# Patient Record
Sex: Female | Born: 1945
Health system: Southern US, Community
[De-identification: ages and names within clinical notes are randomized; demographics above are authoritative.]

## PROBLEM LIST (undated history)

## (undated) DIAGNOSIS — L603 Nail dystrophy: Secondary | ICD-10-CM

## (undated) DIAGNOSIS — F419 Anxiety disorder, unspecified: Secondary | ICD-10-CM

## (undated) DIAGNOSIS — R600 Localized edema: Secondary | ICD-10-CM

## (undated) DIAGNOSIS — Z780 Asymptomatic menopausal state: Secondary | ICD-10-CM

## (undated) DIAGNOSIS — E669 Obesity, unspecified: Secondary | ICD-10-CM

## (undated) DIAGNOSIS — R609 Edema, unspecified: Secondary | ICD-10-CM

## (undated) DIAGNOSIS — R9431 Abnormal electrocardiogram [ECG] [EKG]: Secondary | ICD-10-CM

## (undated) DIAGNOSIS — F32A Depression, unspecified: Secondary | ICD-10-CM

## (undated) DIAGNOSIS — E7800 Pure hypercholesterolemia, unspecified: Secondary | ICD-10-CM

## (undated) DIAGNOSIS — L03116 Cellulitis of left lower limb: Secondary | ICD-10-CM

## (undated) DIAGNOSIS — I10 Essential (primary) hypertension: Secondary | ICD-10-CM

## (undated) DIAGNOSIS — N289 Disorder of kidney and ureter, unspecified: Secondary | ICD-10-CM

## (undated) HISTORY — DX: Localized edema: R60.0

## (undated) HISTORY — DX: Anxiety disorder, unspecified: F41.9

## (undated) HISTORY — DX: Nail dystrophy: L60.3

## (undated) HISTORY — DX: Obesity, unspecified: E66.9

## (undated) HISTORY — DX: Edema, unspecified: R60.9

## (undated) HISTORY — DX: Cellulitis of left lower limb: L03.116

## (undated) HISTORY — DX: Abnormal electrocardiogram (ECG) (EKG): R94.31

## (undated) HISTORY — DX: Asymptomatic menopausal state: Z78.0

## (undated) HISTORY — DX: Disorder of kidney and ureter, unspecified: N28.9

## (undated) HISTORY — DX: Essential (primary) hypertension: I10

## (undated) HISTORY — DX: Depression, unspecified: F32.A

---

## 1992-07-14 HISTORY — PX: TUBAL LIGATION: SHX77

## 1999-07-11 ENCOUNTER — Ambulatory Visit: Admission: RE | Admit: 1999-07-11 | Discharge: 1999-07-11 | Payer: Self-pay | Admitting: Family Medicine

## 1999-10-04 ENCOUNTER — Other Ambulatory Visit: Admission: RE | Admit: 1999-10-04 | Discharge: 1999-10-04 | Payer: Self-pay | Admitting: Obstetrics & Gynecology

## 2001-06-25 ENCOUNTER — Other Ambulatory Visit: Admission: RE | Admit: 2001-06-25 | Discharge: 2001-06-25 | Payer: Self-pay | Admitting: Obstetrics & Gynecology

## 2002-05-12 ENCOUNTER — Emergency Department (HOSPITAL_COMMUNITY): Admission: EM | Admit: 2002-05-12 | Discharge: 2002-05-12 | Payer: Self-pay | Admitting: Emergency Medicine

## 2002-05-13 ENCOUNTER — Encounter: Payer: Self-pay | Admitting: *Deleted

## 2002-07-01 ENCOUNTER — Other Ambulatory Visit: Admission: RE | Admit: 2002-07-01 | Discharge: 2002-07-01 | Payer: Self-pay | Admitting: Obstetrics & Gynecology

## 2003-04-21 ENCOUNTER — Emergency Department (HOSPITAL_COMMUNITY): Admission: AD | Admit: 2003-04-21 | Discharge: 2003-04-21 | Payer: Self-pay | Admitting: Family Medicine

## 2003-07-12 ENCOUNTER — Other Ambulatory Visit: Admission: RE | Admit: 2003-07-12 | Discharge: 2003-07-12 | Payer: Self-pay | Admitting: Obstetrics & Gynecology

## 2004-07-12 ENCOUNTER — Other Ambulatory Visit: Admission: RE | Admit: 2004-07-12 | Discharge: 2004-07-12 | Payer: Self-pay | Admitting: Obstetrics & Gynecology

## 2005-05-07 ENCOUNTER — Encounter: Admission: RE | Admit: 2005-05-07 | Discharge: 2005-05-07 | Payer: Self-pay | Admitting: Family Medicine

## 2005-07-11 ENCOUNTER — Other Ambulatory Visit: Admission: RE | Admit: 2005-07-11 | Discharge: 2005-07-11 | Payer: Self-pay | Admitting: Obstetrics & Gynecology

## 2006-11-23 ENCOUNTER — Emergency Department (HOSPITAL_COMMUNITY): Admission: EM | Admit: 2006-11-23 | Discharge: 2006-11-23 | Payer: Self-pay | Admitting: Emergency Medicine

## 2007-09-29 ENCOUNTER — Encounter (INDEPENDENT_AMBULATORY_CARE_PROVIDER_SITE_OTHER): Payer: Self-pay | Admitting: Internal Medicine

## 2007-09-29 ENCOUNTER — Ambulatory Visit (HOSPITAL_COMMUNITY): Admission: RE | Admit: 2007-09-29 | Discharge: 2007-09-29 | Payer: Self-pay | Admitting: Internal Medicine

## 2007-09-29 ENCOUNTER — Ambulatory Visit: Payer: Self-pay | Admitting: Internal Medicine

## 2007-09-29 DIAGNOSIS — R609 Edema, unspecified: Secondary | ICD-10-CM

## 2007-09-29 DIAGNOSIS — F411 Generalized anxiety disorder: Secondary | ICD-10-CM

## 2007-09-29 DIAGNOSIS — R9431 Abnormal electrocardiogram [ECG] [EKG]: Secondary | ICD-10-CM

## 2007-09-30 LAB — CONVERTED CEMR LAB
AST: 19 units/L (ref 0–37)
Albumin: 3.4 g/dL — ABNORMAL LOW (ref 3.5–5.2)
Alkaline Phosphatase: 80 units/L (ref 39–117)
Glucose, Bld: 93 mg/dL (ref 70–99)
MCHC: 33.7 g/dL (ref 30.0–36.0)
Potassium: 4.1 meq/L (ref 3.5–5.3)
RBC: 3.86 M/uL — ABNORMAL LOW (ref 3.87–5.11)
RDW: 14.6 % (ref 11.5–15.5)
Sodium: 138 meq/L (ref 135–145)
Total Protein: 6.9 g/dL (ref 6.0–8.3)

## 2007-11-09 ENCOUNTER — Ambulatory Visit (HOSPITAL_COMMUNITY): Admission: RE | Admit: 2007-11-09 | Discharge: 2007-11-09 | Payer: Self-pay | Admitting: Internal Medicine

## 2007-11-09 ENCOUNTER — Encounter (INDEPENDENT_AMBULATORY_CARE_PROVIDER_SITE_OTHER): Payer: Self-pay | Admitting: Internal Medicine

## 2008-10-09 ENCOUNTER — Encounter (INDEPENDENT_AMBULATORY_CARE_PROVIDER_SITE_OTHER): Payer: Self-pay | Admitting: Internal Medicine

## 2008-10-09 ENCOUNTER — Ambulatory Visit: Payer: Self-pay | Admitting: Internal Medicine

## 2008-10-09 ENCOUNTER — Ambulatory Visit (HOSPITAL_COMMUNITY): Admission: RE | Admit: 2008-10-09 | Discharge: 2008-10-09 | Payer: Self-pay | Admitting: Internal Medicine

## 2008-10-09 DIAGNOSIS — L608 Other nail disorders: Secondary | ICD-10-CM | POA: Insufficient documentation

## 2008-10-10 ENCOUNTER — Telehealth: Payer: Self-pay | Admitting: Internal Medicine

## 2008-10-16 ENCOUNTER — Encounter (INDEPENDENT_AMBULATORY_CARE_PROVIDER_SITE_OTHER): Payer: Self-pay | Admitting: Internal Medicine

## 2008-10-16 ENCOUNTER — Encounter: Payer: Self-pay | Admitting: Internal Medicine

## 2008-10-16 ENCOUNTER — Ambulatory Visit: Payer: Self-pay | Admitting: Internal Medicine

## 2008-10-16 ENCOUNTER — Ambulatory Visit (HOSPITAL_COMMUNITY): Admission: RE | Admit: 2008-10-16 | Discharge: 2008-10-16 | Payer: Self-pay | Admitting: Internal Medicine

## 2008-10-16 ENCOUNTER — Ambulatory Visit: Payer: Self-pay | Admitting: Vascular Surgery

## 2008-10-16 DIAGNOSIS — L03119 Cellulitis of unspecified part of limb: Secondary | ICD-10-CM

## 2008-10-16 DIAGNOSIS — L02619 Cutaneous abscess of unspecified foot: Secondary | ICD-10-CM

## 2008-10-17 ENCOUNTER — Encounter (INDEPENDENT_AMBULATORY_CARE_PROVIDER_SITE_OTHER): Payer: Self-pay | Admitting: Internal Medicine

## 2008-10-17 LAB — CONVERTED CEMR LAB
BUN: 12 mg/dL (ref 6–23)
CO2: 27 meq/L (ref 19–32)
Calcium: 9.4 mg/dL (ref 8.4–10.5)
Chloride: 111 meq/L (ref 96–112)
Cholesterol: 199 mg/dL (ref 0–200)
Creatinine, Ser: 0.85 mg/dL (ref 0.40–1.20)
GFR calc Af Amer: >60 mL/min (ref >60)
MCHC: 31.9 g/dL (ref 30.0–36.0)
RBC: 4.07 M/uL (ref 3.87–5.11)
Total CHOL/HDL Ratio: 4.3
Triglycerides: 104 mg/dL (ref <150)
VLDL: 21 mg/dL (ref 0–40)
WBC: 7.6 10*3/uL (ref 4.0–10.5)

## 2008-10-18 ENCOUNTER — Telehealth: Payer: Self-pay | Admitting: Internal Medicine

## 2008-10-23 ENCOUNTER — Encounter (INDEPENDENT_AMBULATORY_CARE_PROVIDER_SITE_OTHER): Payer: Self-pay | Admitting: Internal Medicine

## 2008-10-24 ENCOUNTER — Ambulatory Visit: Payer: Self-pay | Admitting: Internal Medicine

## 2008-10-24 ENCOUNTER — Encounter: Payer: Self-pay | Admitting: Internal Medicine

## 2008-10-24 LAB — CONVERTED CEMR LAB
Anti Nuclear Antibody(ANA): NEGATIVE
Rhuematoid fact SerPl-aCnc: <20 intl units/mL (ref 0–20)
Uric Acid, Serum: 4.5 mg/dL (ref 2.4–7.0)

## 2008-10-25 ENCOUNTER — Encounter: Payer: Self-pay | Admitting: Internal Medicine

## 2008-11-13 ENCOUNTER — Encounter: Admission: RE | Admit: 2008-11-13 | Discharge: 2008-11-13 | Payer: Self-pay | Admitting: Podiatry

## 2009-07-05 ENCOUNTER — Ambulatory Visit: Payer: Self-pay | Admitting: Internal Medicine

## 2009-07-11 ENCOUNTER — Ambulatory Visit (HOSPITAL_COMMUNITY): Admission: RE | Admit: 2009-07-11 | Discharge: 2009-07-11 | Payer: Self-pay | Admitting: Internal Medicine

## 2009-07-11 LAB — HM MAMMOGRAPHY: HM Mammogram: NEGATIVE

## 2010-06-17 ENCOUNTER — Ambulatory Visit: Payer: Self-pay | Admitting: Internal Medicine

## 2010-06-17 DIAGNOSIS — J069 Acute upper respiratory infection, unspecified: Secondary | ICD-10-CM | POA: Insufficient documentation

## 2010-07-11 ENCOUNTER — Ambulatory Visit (HOSPITAL_COMMUNITY): Admission: RE | Admit: 2010-07-11 | Payer: Self-pay | Source: Home / Self Care | Admitting: Internal Medicine

## 2010-08-04 ENCOUNTER — Encounter: Payer: Self-pay | Admitting: Internal Medicine

## 2010-08-12 ENCOUNTER — Emergency Department (HOSPITAL_COMMUNITY)
Admission: EM | Admit: 2010-08-12 | Discharge: 2010-08-12 | Payer: Self-pay | Source: Home / Self Care | Admitting: Family Medicine

## 2010-08-13 NOTE — Assessment & Plan Note (Signed)
Summary: ACUTE-PHYSICAL FOR A JOB PATIENT BRINGING PAPERWORK/(MAGICK)/CFB   Vital Signs:  Patient profile:   65 year old female Height:      64.5 inches (163.83 cm) Weight:      212.5 pounds (96.59 kg) BMI:     36.04 Temp:     97.3 degrees F Pulse rate:   64 / minute BP sitting:   151 / 78  (right arm) Cuff size:   large  Vitals Entered By: Dorie Rank RN (June 17, 2010 1:46 PM) CC: physical for job - know needs mmg and PAP done - job is Programmer, applications at [employer] - "just needs physical - no form brought by pt" Is Patient Diabetic? No Pain Assessment Patient in pain? no      Nutritional Status BMI of > 30 = obese  Have you ever been in a relationship where you felt threatened, hurt or afraid?No   Does patient need assistance? Functional Status Self care Ambulation Normal Comments mmg appt made for 07/11/2010 at 1:10 P at [facility] - pt aware last mmg was 07/11/2009 so has to wait to have mmg done at one year for her [employer] to cover the appt. B/P sitting repeated manually left arm - 128/86 - Dr.Jourdyn Ferrin so notified   Primary Care Provider:  Mliss Sax MD  CC:  physical for job - know needs mmg and PAP done - job is Programmer, applications at [employer] - "just needs physical - no form brought by pt".  History of Present Illness: Patient is a 65 year old female who presents to clinic in need of a physical for work.  She states that she is due for a mammogram and her pap smear.  Her only major complaint today is a cough that she has had for 1 and a half weeks.  She denies any fever, chills, SOB, chest pain, or production from the cough.  She does have a sore throat and feels something draining down her throat but no nasal congestion. She has tried OTC cough medicines, alka seltzer cold and flu, and mucinex DM which have had little effect. She has been up alot at night because of the coughing.    Preventive Screening-Counseling & Management  Alcohol-Tobacco  Alcohol drinks/day: 0     Smoking Status: never  Caffeine-Diet-Exercise     Does Patient Exercise: yes  Current Medications (verified): 1)  None  Allergies (verified): No Known Drug Allergies  Past History:  Past medical, surgical, family and social histories (including risk factors) reviewed, and no changes noted (except as noted below).  Past Medical History: Reviewed history from 09/29/2007 and no changes required. Anxiety  Family History: Reviewed history from 09/29/2007 and no changes required. Father - prostate cancer, died at 32. Mother - DM2, HTN, died of an aneurysm at 62. Sister - died, "a lot of problems" Sister - died, "a lot of problems" Sister - older, healthy 5 children - healthy, exc. 1 child deaf, 1 daughter - HTN, HAs  Social History: Reviewed history from 09/29/2007 and no changes required. 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) Married Lives with husband in [place] No alcohol, tobacco, drugs Regular exercise-yes (cleaning) Used to play bowling (!)  Review of Systems  The patient denies anorexia, fever, weight loss, weight gain, vision loss, decreased hearing, hoarseness, chest pain, syncope, dyspnea on exertion, peripheral edema, headaches, hemoptysis, abdominal pain, melena, hematochezia, severe indigestion/heartburn, hematuria, incontinence, genital sores, muscle weakness, suspicious  skin lesions, transient blindness, difficulty walking, depression, unusual weight change, abnormal bleeding, enlarged lymph nodes, angioedema, and breast masses.    Physical Exam  Additional Exam:  General: Well developed, female in no acute distress.  Vital signs reviewed.  Head: Atraumatic, normocephalic with no signs of trauma  Eyes: PERRLA, EOM intact  Ears: TM intact  Nose: Nares patent, mucosa is pink and moist, no polyps noted  Mouth: Mucosa is pink and moist, dental plates well fitting and no sores  Neck: Supple, full ROM,  no thyromegaly or masses noted  Resp: Clear to ascultation bilaterally, no wheezes, rales, or rhonchi noted  CV: Regular rate and rhythm with no murmurs, rubs, or gallops noted  Abdomen: Soft, non-tender, non-distended with normal bowel sounds  Musculoskelatal: ROM full with intact strength, no pain to palpation  Neurologic: Alert and oriented x3, Cranial nerves II-XII grossly intact, DTR normal and symmetric  Pulses: Radial, brachial, carotid, femoral, dorsal pedis, and posterior tibial pulses equal and symmetric     Impression & Recommendations:  Problem # 1:  URI (ICD-465.9) SHe likely has a viral URI that appears to be clearing.  Her husband has been sick as well.  She is requesting something to help with the cough during the night so she can rest.  We will give her some Chertussin that she can take at night to help.  She was warned that the medication can make her drowsy, unsteady on her feet, and sedated.    The following medications were removed from the medication list:    Ibuprofen 400 Mg Tabs (Ibuprofen) .Marland Kitchen... Take 1 tablet every 4-6 hours as needed for pain Her updated medication list for this problem includes:    Guaifenesin-codeine 100-10 Mg/74ml Syrp (Guaifenesin-codeine) .Marland Kitchen... Take 1 teaspoon at bedtime for cough relief.  Problem # 2:  Preventive Health Care (ICD-V70.0) She is due for her preventative health testing including mammograms.  We will schedule the mammogram for 1 year after her last one.  She has never had an abnormal pap smear and is in a stable relationship so she would be a candidate to have a pap smear every other year based on the latest recommendations.  She will check with her insurance before proceeding with the pap smear to ensure that it would be covered.  She has gotten her flu shot this year but she can't remember the last time she had a tetanus shot.  She is not a candidate for the pneumovax until she turns 66.  We also discussed colonoscopy and she states  that she knows her husband needs one as well so after he gets his done she will make an appointment to have her's done as well.    Complete Medication List: 1)  Guaifenesin-codeine 100-10 Mg/54ml Syrp (Guaifenesin-codeine) .... Take 1 teaspoon at bedtime for cough relief.  Other Orders: Mammogram (Screening) (Mammo)  Patient Instructions: 1)  Start Cheratussin.  1 teaspoon at night.   2)  Caution: Can cause drowsiness, dizziness, and unsteadiness. 3)  Keep your mammogram appointment and consider the colonoscopy in the near future.   Prescriptions: GUAIFENESIN-CODEINE 100-10 MG/5ML SYRP (GUAIFENESIN-CODEINE) Take 1 teaspoon at bedtime for cough relief.  #100 ml x 0   Entered and Authorized by:   Leodis Sias MD   Signed by:   Leodis Sias MD on 06/17/2010   Method used:   Print then Give to Patient   RxID:   [id number]    Orders Added: 1)  Est. Patient Level III [14782]  2)  Mammogram (Screening) [Mammo]   Immunization History:  Influenza Immunization History:    Influenza:  fluvax 3+ (05/27/2010)   Immunization History:  Influenza Immunization History:    Influenza:  Fluvax 3+ (05/27/2010)   Prevention & Chronic Care Immunizations   Influenza vaccine: Fluvax 3+  (05/27/2010)    Tetanus booster: Not documented    Pneumococcal vaccine: Not documented    H. zoster vaccine: Not documented  Colorectal Screening   Hemoccult: Not documented    Colonoscopy: Not documented   Colonoscopy action/deferral: Deferred  (06/17/2010)  Other Screening   Pap smear: NEGATIVE FOR INTRAEPITHELIAL LESIONS OR MALIGNANCY.  (07/03/2009)    Mammogram: ASSESSMENT: Negative - BI-RADS 1^MM DIGITAL SCREENING  (07/11/2009)    DXA bone density scan: Not documented   Smoking status: never  (06/17/2010)  Lipids   Total Cholesterol: 199  (10/16/2008)   LDL: 132  (10/16/2008)   LDL Direct: Not documented   HDL: 46  (10/16/2008)   Triglycerides: 104   (10/16/2008)   Appended Document: ACUTE-PHYSICAL FOR A JOB PATIENT BRINGING PAPERWORK/(MAGICK)/CFB Ms. Schecter's history and physical examination were reviewed with Dr. Tonny Branch and the assessment and plan were formulated together.  I agree with the above documentation.

## 2010-08-13 NOTE — Assessment & Plan Note (Signed)
Summary: feet swollen, several days, painful, hard to walk/per dr Alyse Low...   Vital Signs:  Patient profile:   65 year old female Height:      65.5 inches (166.37 cm) Weight:      219.2 pounds (99.64 kg) BMI:     36.05 Temp:     98.8 degrees F (37.11 degrees C) oral Pulse rate:   76 / minute BP sitting:   128 / 76  (right arm)  Vitals Entered By: Stanton Kidney Ditzler RN (October 09, 2008 4:03 PM) Is Patient Diabetic? No Pain Assessment Patient in pain? yes     Location: left foot Intensity: 4-5 Type: swelling Onset of pain  1 week Nutritional Status BMI of > 30 = obese Nutritional Status Detail appetite good  Have you ever been in a relationship where you felt threatened, hurt or afraid?denies   Does patient need assistance? Functional Status Self care Ambulation Normal Comments For past week - left foot swollen and left hip hurts.   Primary Care Provider:  Carlus Pavlov MD   History of Present Illness: 65 yo female with nonsignificant PMH came in concerned with Left foot swelling that started 1 week ago and has been getting progressively worse. She denies specific trauma to the foot and reports being in her usual state of health, works every day at [employer] as maintainance and in the past week she has been having difficulty with bearing weight and the foot is more painful. She denies fever, chills, systemic complaints nausea, vomiting, diarrhea, no chest pain, shortness of breath. She has had similar episode last year and she can not recall exactly what has caused it but it has resolved on its own.   Preventive Screening-Counseling & Management     Alcohol drinks/day: 0     Smoking Status: never  Problems Prior to Update: 1)  Dystrophic Nails  (ICD-703.8) 2)  Abnormal Electrocardiogram  (ICD-794.31) 3)  Screening For Unspecified Malignant Neoplasm  (ICD-V76.9) 4)  Screening For Ischemic Heart Disease  (ICD-V81.0) 5)  Postmenopausal Status  (ICD-627.2) 6)   Peripheral Edema  (ICD-782.3) 7)  Anxiety  (ICD-300.00)  Medications Prior to Update: 1)  Alprazolam 0.5 Mg  Tabs (Alprazolam) .... Take 1 Tab As Needed For Anxiety 2)  Aspirin 81 Mg  Tbec (Aspirin) .... Take 1 Tablet By Mouth Once A Day  Allergies (verified): No Known Drug Allergies  Past History:  Past Medical History:    Anxiety     (09/29/2007)  Family History:    Father - prostate cancer, died at 41.    Mother - DM2, HTN, died of an aneurysm at 8.    Sister - died, "a lot of problems"    Sister - died, "a lot of problems"    Sister - older, healthy    5 children - healthy, exc. 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       Mother - DM2, HTN, died of an aneurysm at 76.       Sister - died, "a lot of problems"       Sister - died, "a lot of problems"       Sister - older, healthy       5 children - healthy, exc. 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  The patient denies fever, weight loss, weight gain, chest pain, dyspnea on exertion, abdominal pain, depression, and unusual weight change.     Foot/Ankle Exam  Gait:    Normal heel-toe gait pattern bilaterally.    Skin:    Intact with no scars, lesions, rashes, cafe-au-lait spots or bruising.    Palpation:    tenderness L-forefoot:, tenderness L-lateral malleoulus:, and tenderness L-medial malleoulus:Marland Kitchen    Vascular:    dorsalis pedis and posterior tibial pulses 2+ and symmetric, capillary refill < 2 seconds, normal hair pattern, no evidence of ischemia.   Sensory:    gross sensation intact bilaterally in lower extremities.    Motor:    Motor strength 5/5 bilaterally for ankle dorsiflexion, ankle plantar flexion, ankle inversion and ankle eversion.    Reflexes:    Normal and symmetric Achilles reflexes bilaterally.    Ankle Exam:    Right:    Inspection:  Normal    Palpation:  Normal    Stability:  stable    Tenderness:  no    Swelling:  no    Erythema:  no    Left:    Inspection:  Normal    Palpation:  Normal    Stability:  stable    Tenderness:  yes    Swelling:  yes    Erythema:  no  Foot Exam:    Right:    Inspection:  Normal    Palpation:  Normal    Stability:  stable    Tenderness:  no    Swelling:  no    Erythema:  no    Left:    Inspection:  Normal    Palpation:  Normal    Stability:  stable    Tenderness:  yes    Swelling:  yes    Erythema:  no   Impression & Recommendations:  Problem # 1:  PERIPHERAL EDEMA (ICD-782.3) Patient had similar episode in the past which has resolved on its own. I am not exactly clear on the etiology of this acute unilateral ankle and foot swelling and my differential after discussing with Dr. Landis Martins includes osteoarthritis flare secondary to work related duties, gout, infection, osteo, DVT, and  the most likely diagnosis being the first one. This would not be  typical presentation of gout since patient able to wera shoes with no problems, will get xray to rule possible trauma even though patient denies any injuries or traumas to the foot. Will also get venous doppler to rule out DVT. Infection is also less likely since the foot and ankle do not appear to be inflamed, there is no redness, and no warmth to palpation. Will also get basic labs to rule out endocrine abnormalities. This patient had work up done in the past for the same problem, cardiac work up being WNL. I will see this patient in 1 week to follow up on the swelling and by that time will have the labs and tests back. For now I have told her to take ibuprofen scheduled every 6 hours until she comes back to the clinic.   Orders: T-Comprehensive Metabolic Panel [id number]) T-Lipid Profile ([id number]) T-CBC No Diff ([id number]) T-TSH ([id number]) Radiology other (Radiology Other) LE Venous Duplex (DVT) (DVT)  Complete Medication List: 1)  Alprazolam 0.5 Mg Tabs (Alprazolam) .... Take 1 tab as needed for anxiety 2)  Aspirin 81 Mg Tbec (Aspirin) .... Take 1 tablet by mouth once a day  Other Orders: Podiatry Referral (Podiatry)  Patient Instructions: 1)  Schedule follow up appointment in 1 week. 2)  Take 400mg  of Ibuprofen (Advil, Motrin) with food every 6 hours relief of pain, teke for 1 week and come back for follow up.

## 2010-08-13 NOTE — Assessment & Plan Note (Signed)
Summary: 1WK F/U/GHERGHE/VS   Vital Signs:  Patient profile:   65 year old female Height:      65.5 inches Weight:      216.4 pounds BMI:     35.59 Temp:     98.6 degrees F oral Pulse rate:   84 / minute BP sitting:   142 / 70  (right arm)  Vitals Entered By: Krystal Eaton Duncan Dull) (October 16, 2008 2:30 PM) Is Patient Diabetic? No Pain Assessment Patient in pain? yes     Location: left foot Intensity: 6 Type: burning Onset of pain  2wks Nutritional Status BMI of > 30 = obese  Have you ever been in a relationship where you felt threatened, hurt or afraid?No   Does patient need assistance? Functional Status Self care Ambulation Normal Comments 1wk f/u left foot pain   Primary Care Provider:  Carlus Pavlov MD   History of Present Illness: 65 yo female with no significant PMH except peripheral edema came in for regular follow up from last week for L foot and ankle swelling that has been going on for two weeks now. She has been working every day and is able to wear her shoe on the left foot but the pain is still present, 5/10 in severity and the foot feels tight and it is itching now inside. The foot now looks more red now and warm to touch. Denies fever, chills, no systemic complaints. Denies traumas to the foot.   Preventive Screening-Counseling & Management     Alcohol drinks/day: 0     Smoking Status: never     Does Patient Exercise: yes  Problems Prior to Update: 1)  Dystrophic Nails  (ICD-703.8) 2)  Abnormal Electrocardiogram  (ICD-794.31) 3)  Screening For Unspecified Malignant Neoplasm  (ICD-V76.9) 4)  Screening For Ischemic Heart Disease  (ICD-V81.0) 5)  Postmenopausal Status  (ICD-627.2) 6)  Peripheral Edema  (ICD-782.3) 7)  Anxiety  (ICD-300.00)  Medications Prior to Update: 1)  Alprazolam 0.5 Mg  Tabs (Alprazolam) .... Take 1 Tab As Needed For Anxiety 2)  Aspirin 81 Mg  Tbec (Aspirin) .... Take 1 Tablet By Mouth Once A Day  Allergies (verified): No  Known Drug Allergies  Past History:  Past Medical History:    Anxiety     (09/29/2007)  Family History:    Father - prostate cancer, died at 89.    Mother - DM2, HTN, died of an aneurysm at 80.    Sister - died, "a lot of problems"    Sister - died, "a lot of problems"    Sister - older, healthy    5 children - healthy, exc. 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       Mother - DM2, HTN, died of an aneurysm at 6.       Sister - died, "a lot of problems"       Sister - died, "a lot of problems"       Sister - older, healthy       5 children - healthy, exc. 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  The patient denies fever, weight loss, weight gain, chest pain, dyspnea on exertion, abdominal pain, depression, unusual weight change, and abnormal bleeding.    Physical Exam  General:  alert, well-developed,  well-nourished, and well-hydrated.   Pulses:  R posterior tibial normal, R dorsalis pedis normal, L posterior tibial normal, and L dorsalis pedis normal.   Extremities:  1+ left pedal edema, left pretibial edema, and 1+ right pedal edema.   Skin:  erythema over the left foot,not extending up the shin   Foot/Ankle Exam  Vascular:    dorsalis pedis and posterior tibial pulses 2+ and symmetric, capillary refill < 2 seconds, no evidence of ischemia.   Sensory:    gross sensation intact bilaterally in lower extremities.    Motor:    Motor strength 5/5 bilaterally for ankle dorsiflexion, ankle plantar flexion, ankle inversion and ankle eversion.    Ankle Exam:    Right:    Inspection:  Normal    Palpation:  Normal    Stability:  stable    Tenderness:  no    Swelling:  no    Erythema:  no    Left:    Inspection:  Normal    Palpation:  Normal    Stability:  stable    Tenderness:  yes    Swelling:  yes    Erythema:  yes  Foot Exam:    Right:    Inspection:  Normal    Palpation:  Normal    Stability:  stable    Tenderness:  no    Swelling:  no    Erythema:  no    Left:    Inspection:  Normal    Palpation:  Normal    Stability:  stable    Tenderness:  yes    Swelling:  yes    Erythema:  yes   Impression & Recommendations:  Problem # 1:  PERIPHERAL EDEMA (ICD-782.3)  Patient's foot looks more red this week compared to the last week and it is warm to touch and also appearsmore swollen. Patient denies fever, chills, and other systemic complaints but the foot is getting worse. X-ray last week showed soft tissue swelling and mild degenerative changes, no fractures, or dislocations. Physical exam findings are most consistent with cellulitis. Will check the labs today that were not done last week: cmet, cbc, TSH, and will also  get venous doppler of the left LE to rule out DVT. Will start patient on Doxycycline treatment 100 mg BID, for 10 days. Discussed elevation of the legs, use  of compression stockings, sodium restiction, and medication use.   Complete Medication List: 1)  Alprazolam 0.5 Mg Tabs (Alprazolam) .... Take 1 tab as needed for anxiety 2)  Aspirin 81 Mg Tbec (Aspirin) .... Take 1 tablet by mouth once a day 3)  Doxycycline Hyclate 100 Mg Tabs (Doxycycline hyclate) .... Take 1 tablet by mouth two times a day for 10 days  Patient Instructions: 1)  Please schedule a follow-up appointment in 2 weeks. Prescriptions: DOXYCYCLINE HYCLATE 100 MG TABS (DOXYCYCLINE HYCLATE) Take 1 tablet by mouth two times a day for 10 days  #20 x 0   Entered and Authorized by:   Mliss Sax MD   Signed by:   Mliss Sax MD on 10/16/2008   Method used:   Print then Give to Patient   RxID:   [id number]

## 2010-09-03 ENCOUNTER — Ambulatory Visit (HOSPITAL_COMMUNITY)
Admission: RE | Admit: 2010-09-03 | Discharge: 2010-09-03 | Disposition: A | Discharge status: Home / Self Care | Payer: Self-pay | Source: Ambulatory Visit | Attending: Internal Medicine | Admitting: Internal Medicine

## 2010-09-03 ENCOUNTER — Ambulatory Visit (INDEPENDENT_AMBULATORY_CARE_PROVIDER_SITE_OTHER): Payer: Self-pay | Admitting: Ophthalmology

## 2010-09-03 ENCOUNTER — Encounter: Payer: Self-pay | Admitting: Ophthalmology

## 2010-09-03 DIAGNOSIS — Z0000 Encounter for general adult medical examination without abnormal findings: Secondary | ICD-10-CM | POA: Insufficient documentation

## 2010-09-03 DIAGNOSIS — M25531 Pain in right wrist: Secondary | ICD-10-CM

## 2010-09-03 DIAGNOSIS — M25539 Pain in unspecified wrist: Secondary | ICD-10-CM

## 2010-09-03 DIAGNOSIS — M899 Disorder of bone, unspecified: Secondary | ICD-10-CM | POA: Insufficient documentation

## 2010-09-03 MED ORDER — IBUPROFEN 200 MG PO TABS: 400.0000 mg | ORAL_TABLET | Freq: Four times a day (QID) | ORAL | Status: DC | PRN

## 2010-09-03 MED ORDER — HYDROCODONE-ACETAMINOPHEN 5-500 MG PO TABS: 1.0000 | ORAL_TABLET | Freq: Four times a day (QID) | ORAL | Status: DC | PRN
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
## 2010-09-03 NOTE — Patient Instructions (Signed)
It is critical that you call the front desk to update your phone number , otherwise I will be unable to contact you regarding the results other x-rays. I will contact her tomorrow when your results are in. If you do have a fracture, I will refer you to an orthopedic surgeon. Please return in the next 3 months to see your primary care physician , or earlier if needed.

## 2010-09-03 NOTE — Assessment & Plan Note (Addendum)
Pt states that she plans to get her pap smear and mammogram with Dr. Arlyce Dice her OBGYN.  The patient continues to decline colonoscopy until her husband does it first.  Will continue to consultation on this at future visits.

## 2010-09-03 NOTE — Assessment & Plan Note (Signed)
Given continued pain plan localized edema of the right forearm, I am highly concerned about possible fracture. I will check a complete forearm, wrist, and hand x-ray today. The patient currently has a split which was very expensive to her , I recommended that she continue to wear it at this time with avoidance of any pronation of her forearm. I told the patient that I would contact her tomorrow with the results of her x-ray,  and would refer her to an orthopedic surgeon should there be a fracture. I did recommend that the patient not perform any activities with that hand until we get the results of her x-ray and  the patient was not concerned about this because she is left-handed. I will continue the patient's Vicodin and ibuprofen at this time but also recommended symptomatic treatment with ice , and elevation

## 2010-09-04 ENCOUNTER — Encounter: Payer: Self-pay | Admitting: Ophthalmology

## 2010-09-04 ENCOUNTER — Telehealth: Payer: Self-pay | Admitting: Ophthalmology

## 2010-09-04 DIAGNOSIS — M25531 Pain in right wrist: Secondary | ICD-10-CM

## 2010-09-04 NOTE — Telephone Encounter (Signed)
X-ray of the wrist forearm and hand did not demonstrate any fractures. Given the patient's continued swelling and pain, I will refer her to an orthopedic surgeon. I have contacted the patient and informed her of the results at this point, recommend that the patient continued to use her splint,  and to try to continue avoidance of pronation of that hand until she is seen by the orthopedic surgeon.

## 2010-09-04 NOTE — Telephone Encounter (Signed)
This encounter was created in error - please disregard.

## 2010-09-04 NOTE — Assessment & Plan Note (Signed)
As mentioned below,  No fracture was noted on x-ray. I will continue to have the patient use her splint, and we'll have her follow with orthopedic surgery.

## 2010-09-04 NOTE — Telephone Encounter (Signed)
I have not yet been able to contact the patient to inform her of the results as she has not called back to update her phone number.

## 2010-09-05 NOTE — Telephone Encounter (Signed)
The patient was called and informed that she does not appear to have a fracture.  I told her that she would be called with an appointment to see an orthopedic surgeon for further evaluation of her swelling/pain.  Pt was contacted at [phone].

## 2010-09-07 ENCOUNTER — Encounter: Payer: Self-pay | Admitting: Internal Medicine

## 2010-09-10 ENCOUNTER — Telehealth: Payer: Self-pay | Admitting: *Deleted

## 2010-09-10 NOTE — Telephone Encounter (Signed)
SPOKE WITH MS Jonee A Mccants CONCERNING THE ORTHOPEDIC SURGERY REFERRAL THAT WAS ORDERED. NEEDING TO KNOW HOW TO MAKE REFERRAL AS PAYMENT IS CONCERNED.  SHE IS WANTING THIS TO BE FILED ON THE  [employer].  / Juanetta Gosling [phone]   /  CLAIM # [id number].  CALLED MS ASTER AND LEFT VOICE MESSAGE FOR HER TO CALL ME HERE AT [phone],  TO GIVE ME INSTRUCTIONS AS TO HOW OR WHAT I NEED TO DO FROM HERE.  Ellison Rieth NTII

## 2010-09-12 ENCOUNTER — Telehealth: Payer: Self-pay | Admitting: *Deleted

## 2010-09-12 NOTE — Telephone Encounter (Signed)
CALL BACK FROM [employer] (TARA ASTER [phone] ). TARA CALLED TO LET ME KNOW THAT THE INSURANCE FOR MEDICAL IS REIMBURSTMENT. PATIENT IS TO PAY THEN FILE WITH THE INSURANCE AND IS NOT A GUARANTEE THAT IT WILL PAY.  PATIENT NEEDS TO CALL  BACK.  I WILL CALL THE PATIENT AND HAVE THE PATIENT TO CALL THE INSURANCE.

## 2010-09-12 NOTE — Telephone Encounter (Signed)
CALLED AND SPOKE WITH MS Siravo. LET PATIENT KNOW THAT SHE NEEDS TO CALL TARA AT [employer].  AND THAT THE INSURANCE FOR MEDICAL IS REIMBURSTMENT, AND THAT SHE WILL NEED TO TAKE CARE OF THE BILL AND THE INSURANCE WILL PAY BACK AND NOT A GUARANTEE THAT IT WILL PAY BACK. NEEDS TO CALL TARA. PATIENT STATES SHE WILL CALL HER AGENT AND CALL ME BACK.

## 2010-10-02 ENCOUNTER — Other Ambulatory Visit: Payer: Self-pay | Admitting: *Deleted

## 2010-10-02 MED ORDER — IBUPROFEN 200 MG PO TABS: 400.0000 mg | ORAL_TABLET | Freq: Four times a day (QID) | ORAL | Status: DC | PRN

## 2010-10-02 MED ORDER — HYDROCODONE-ACETAMINOPHEN 5-500 MG PO TABS: 1.0000 | ORAL_TABLET | Freq: Four times a day (QID) | ORAL | Status: DC | PRN

## 2010-10-02 NOTE — Telephone Encounter (Signed)
Pt is asking for a orthopedic referral for hand pain.

## 2010-10-02 NOTE — Telephone Encounter (Signed)
Last refill 2/21

## 2010-10-03 NOTE — Telephone Encounter (Signed)
Rx called in 

## 2010-10-30 ENCOUNTER — Telehealth: Payer: Self-pay | Admitting: *Deleted

## 2010-10-30 NOTE — Telephone Encounter (Signed)
CALLED MS Davie TO FOLLOW UP ON HER ORTHO REFERRAL. PH# [phone] HAS A SP[employer]ISH SPEAKING VOICE MAIL.  UY#[phone] IS OUT OF ORDER !

## 2010-12-06 ENCOUNTER — Ambulatory Visit (INDEPENDENT_AMBULATORY_CARE_PROVIDER_SITE_OTHER): Payer: 59 | Admitting: Internal Medicine

## 2010-12-06 ENCOUNTER — Encounter: Payer: Self-pay | Admitting: Internal Medicine

## 2010-12-06 ENCOUNTER — Telehealth: Payer: Self-pay | Admitting: *Deleted

## 2010-12-06 VITALS — BP 147/84 | HR 78 | Temp 98.3°F | Ht 64.0 in | Wt 202.8 lb

## 2010-12-06 DIAGNOSIS — K529 Noninfective gastroenteritis and colitis, unspecified: Secondary | ICD-10-CM | POA: Insufficient documentation

## 2010-12-06 DIAGNOSIS — K5289 Other specified noninfective gastroenteritis and colitis: Secondary | ICD-10-CM

## 2010-12-06 DIAGNOSIS — R197 Diarrhea, unspecified: Secondary | ICD-10-CM

## 2010-12-06 MED ORDER — LOPERAMIDE HCL 2 MG PO TABS: 2.0000 mg | ORAL_TABLET | ORAL | Status: AC

## 2010-12-06 MED ORDER — OMEPRAZOLE 40 MG PO CPDR: 40.0000 mg | DELAYED_RELEASE_CAPSULE | Freq: Every day | ORAL | Status: DC

## 2010-12-06 NOTE — Progress Notes (Signed)
  Subjective:    Patient ID: Sarah [name], female    DOB: 1946/03/29, 65 y.o.   MRN: [id number]  HPI Sarah [name] is a 56 year woman with past medical history Of anxiety comes to the clinic chief complaint of nausea, vomiting, diarrhea for last 3 days. She started having diarrhea on Wednesday morning which is profuse watery, and about 15-20 times a day for last 3 days. Along with diarrhea she also complains of nausea and vomiting. She vomited about 15-20 times per last 3 days and is yellow vomitus without any blood. She denies any fever, chills, dizziness, headache, abdominal pain, no distention. She does complain of weakness in the body due to having this acute episodes of diarrhea and vomiting. She says she is keeping up with fluid losses by drinking ginger ale which sometimes stays down and sometimes is better in terms of vomitus. Denies any recent sick contacts or unusual food intake or recent travel history.  Review of Systems    as per history of present illness Objective:   Physical Exam    Constitutional: Vital signs reviewed.  Patient is a well-developed and well-nourished in no acute distress and cooperative with exam. Alert and oriented x3.  Head: Normocephalic and atraumatic Mouth: no erythema or exudates, MMM Eyes: PERRL, EOMI, conjunctivae normal, No scleral icterus.  Neck: Supple, Trachea midline normal ROM, No JVD, mass, thyromegaly, or carotid bruit present.  Cardiovascular: RRR, S1 normal, S2 normal, no MRG, pulses symmetric and intact bilaterally Pulmonary/Chest: CTAB, no wheezes, rales, or rhonchi Abdominal: Soft. Non-tender, non-distended, bowel sounds are normal, no masses, organomegaly, or guarding present.  Musculoskeletal: No joint deformities, erythema, or stiffness, ROM full and no nontender Neurological: A&O x3, Strenght is normal and symmetric bilaterally, cranial nerve II-XII are grossly intact, no focal motor deficit, sensory intact to light touch bilaterally.    Skin: Warm, dry and intact. No rash, cyanosis, or clubbing.       Assessment & Plan:

## 2010-12-06 NOTE — Patient Instructions (Addendum)
Please make a f/u appointment in 5-6 months. Please take imodium 1 tablet every 2 hrs as needed for diarrhea... Maximum 8 tablets a day. Also take Prilosec 1 capsule daily for next week. If you are not feeling well uin next 3-4 days, call the clinic. Also keep up with your fluid loss by drinking enough water , ginger ale or juice.

## 2010-12-06 NOTE — Telephone Encounter (Signed)
Pt called with c/o not eating, diarrhea ( for 3 days ), vomiting. Not able to keep fluids down. Denies pain, or fever.

## 2010-12-06 NOTE — Assessment & Plan Note (Signed)
Vitals stable, blood pressure normal, no tachycardia- does not seem to be clinically dehydrated. I will check a BMP and CBC to rule out electrolyte abnormalities and acute infection. -We'll prescribe Imodium 2 mg tablets every 2 hours with maximum 8 times a day and Prilosec 40 mg capsules for 1 week. - Explained her to call the clinic or go to ER if her symptoms does not get better or gets worse in next 3-4 days.

## 2010-12-06 NOTE — Telephone Encounter (Signed)
Will see pt today at 1315

## 2010-12-07 LAB — BASIC METABOLIC PANEL WITH GFR
BUN: 13 mg/dL (ref 6–23)
Calcium: 9.3 mg/dL (ref 8.4–10.5)
GFR, Est African American: >60 mL/min (ref >60)
GFR, Est Non African American: 56 mL/min — ABNORMAL LOW (ref >60)
Potassium: 3.7 mEq/L (ref 3.5–5.3)

## 2011-04-14 HISTORY — PX: CLOSED REDUCTION TIBIAL FRACTURE: SHX1361

## 2011-05-01 ENCOUNTER — Emergency Department (HOSPITAL_COMMUNITY): Payer: 59

## 2011-05-01 ENCOUNTER — Inpatient Hospital Stay (HOSPITAL_COMMUNITY)
Admission: EM | Admit: 2011-05-01 | Discharge: 2011-05-06 | DRG: 493 | Disposition: A | Discharge status: Rehabilitation Facility | Payer: 59 | Attending: Orthopedic Surgery | Admitting: Orthopedic Surgery

## 2011-05-01 DIAGNOSIS — D62 Acute posthemorrhagic anemia: Secondary | ICD-10-CM | POA: Diagnosis not present

## 2011-05-01 DIAGNOSIS — W07XXXA Fall from chair, initial encounter: Secondary | ICD-10-CM | POA: Diagnosis present

## 2011-05-01 DIAGNOSIS — S82209A Unspecified fracture of shaft of unspecified tibia, initial encounter for closed fracture: Principal | ICD-10-CM | POA: Diagnosis present

## 2011-05-01 DIAGNOSIS — Y998 Other external cause status: Secondary | ICD-10-CM

## 2011-05-01 LAB — BASIC METABOLIC PANEL
CO2: 27 mEq/L (ref 19–32)
Calcium: 8.9 mg/dL (ref 8.4–10.5)
GFR calc non Af Amer: 87 mL/min — ABNORMAL LOW (ref >90)
Sodium: 140 mEq/L (ref 135–145)

## 2011-05-01 LAB — CBC
HCT: 32.9 % — ABNORMAL LOW (ref 36.0–46.0)
MCHC: 33.1 g/dL (ref 30.0–36.0)
Platelets: 220 10*3/uL (ref 150–400)
RDW: 13.3 % (ref 11.5–15.5)
WBC: 9.4 10*3/uL (ref 4.0–10.5)

## 2011-05-01 LAB — DIFFERENTIAL
Basophils Absolute: 0 10*3/uL (ref 0.0–0.1)
Basophils Relative: 0 % (ref 0–1)
Eosinophils Absolute: 0 10*3/uL (ref 0.0–0.7)
Eosinophils Relative: 0 % (ref 0–5)
Lymphocytes Relative: 14 % (ref 12–46)

## 2011-05-01 LAB — COMPREHENSIVE METABOLIC PANEL
ALT: 11 U/L (ref 0–35)
AST: 13 U/L (ref 0–37)
Albumin: 3.5 g/dL (ref 3.5–5.2)
Alkaline Phosphatase: 82 U/L (ref 39–117)
Glucose, Bld: 98 mg/dL (ref 70–99)
Potassium: 3.4 mEq/L — ABNORMAL LOW (ref 3.5–5.1)
Sodium: 139 mEq/L (ref 135–145)
Total Protein: 6.9 g/dL (ref 6.0–8.3)

## 2011-05-02 LAB — CBC
HCT: 29.6 % — ABNORMAL LOW (ref 36.0–46.0)
Hemoglobin: 9.9 g/dL — ABNORMAL LOW (ref 12.0–15.0)
MCH: 29.8 pg (ref 26.0–34.0)
MCHC: 33.4 g/dL (ref 30.0–36.0)
MCV: 89.2 fL (ref 78.0–100.0)
Platelets: 205 10*3/uL (ref 150–400)
RBC: 3.32 MIL/uL — ABNORMAL LOW (ref 3.87–5.11)
RDW: 13.1 % (ref 11.5–15.5)
WBC: 10.8 10*3/uL — ABNORMAL HIGH (ref 4.0–10.5)

## 2011-05-02 LAB — BASIC METABOLIC PANEL
BUN: 7 mg/dL (ref 6–23)
CO2: 26 mEq/L (ref 19–32)
Chloride: 104 mEq/L (ref 96–112)
Creatinine, Ser: 0.71 mg/dL (ref 0.50–1.10)

## 2011-05-02 LAB — PROTIME-INR
INR: 1.04 (ref 0.00–1.49)
Prothrombin Time: 13.8 seconds (ref 11.6–15.2)
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
## 2011-05-04 LAB — CBC
Hemoglobin: 9 g/dL — ABNORMAL LOW (ref 12.0–15.0)
MCH: 29.5 pg (ref 26.0–34.0)
MCV: 90.8 fL (ref 78.0–100.0)
RBC: 3.05 MIL/uL — ABNORMAL LOW (ref 3.87–5.11)
WBC: 8.6 10*3/uL (ref 4.0–10.5)

## 2011-05-04 LAB — PROTIME-INR: Prothrombin Time: 16.2 seconds — ABNORMAL HIGH (ref 11.6–15.2)

## 2011-05-05 LAB — CBC
HCT: 26.7 % — ABNORMAL LOW (ref 36.0–46.0)
Hemoglobin: 8.9 g/dL — ABNORMAL LOW (ref 12.0–15.0)
RDW: 13.3 % (ref 11.5–15.5)
WBC: 7.5 10*3/uL (ref 4.0–10.5)

## 2011-05-05 LAB — PROTIME-INR
INR: 1.69 — ABNORMAL HIGH (ref 0.00–1.49)
Prothrombin Time: 20.2 seconds — ABNORMAL HIGH (ref 11.6–15.2)

## 2011-05-05 NOTE — Op Note (Signed)
NAMEDASHIA, CALDEIRA NO.:  [id number]  MEDICAL RECORD NO.:  [id number]  LOCATION:  5023                         FACILITY:  [facility]  PHYSICIAN:  Eulas Post, MD    DATE OF BIRTH:  July 17, 1945  DATE OF PROCEDURE:  05/01/2011 DATE OF DISCHARGE:                              OPERATIVE REPORT   ATTENDING SURGEON:  Eulas Post, MD.  ASSISTANT:  Janace Litten, Orthopedic PA-C  PREOPERATIVE DIAGNOSIS:  Left tibial plateau fracture and left tibial shaft fracture.  POSTOPERATIVE DIAGNOSIS:  Left tibial plateau fracture and left tibial shaft fracture.  OPERATIVE PROCEDURE:  Closed reduction and percutaneous screw fixation of left tibial plateau fracture with intramedullary nailing of left tibial shaft fracture.  ANESTHESIA:  General.  ESTIMATED BLOOD LOSS:  Minimal.  TOURNIQUET TIME:  60 minutes.  OPERATIVE IMPLANT:  A total of 3 solid 3.5-mm screws from [employer] for the tibial plateau, and a [employer] size 9-mm tibial nail placed in an extended position through a superolateral approach with a total of 3 proximal interlocking screws and 2 distal interlocking screws.  PREOPERATIVE INDICATIONS:  Attending surgeon is myself.  First assistance is Janace Litten, Orthopedic PA-C present and scrubbed throughout the case and critical for assistance with positioning, instrumentation, reaming, and closure.  OPERATIVE PROCEDURE:  The patient was brought to the operating room, placed in supine position.  IV antibiotics were given.  General anesthesia was administered.  The left lower extremity was prepped and draped in usual sterile fashion.  Care was taken during the prepping in order to make sure that the fracture did not displace.  After sterile prep and drape, time-out was performed and the left leg was gently exsanguinated and the tourniquet was inflated.  A small incision was made laterally, and I placed a total of 3 screws rafting the tibial plateau, holding  the articular fractures together.  This provided adequate fixation for the articular pieces, and I then turned my attention to the tibial shaft.  I also did use washers with the 3.5-mm screws in order to gain better purchase on the bone.  I then elected to use a superolateral portal for the placement of the nail.  This was basically just lateral to the patella at upper border. I took care not to violate the articular capsule, and dissected down through and prepared a spot at the proximal tibia.  Care was also taken to protect the patellar tendon.  A guidewire was introduced into the appropriate location and confirmed on AP and lateral views.  I was directly in line with the lateral tibial eminence.  I then introduced an awl and then opened up the proximal tibia and then placed a guide pin through the awl, down across the fracture site.  There was extreme comminution in the tibia with fracture lines extending all the way up to the plateau.  I was able to get the guidewire down across into the distal segment.  I confirmed length and measured the length, and selected the reamers.  I used the cannula to protect the patellar tendon as well as the patella.  I did not violate the capsule, so I did not actually engage  with any cartilage.  I reamed progressively by 0.5-mm increments up to a 10.5 in order to place a size 9 nail.  This went down easily without difficulty.  I placed the nail into the appropriate location and confirmed on AP and lateral views.  The nail was then locked using 2 proximal oblique screws, which engaged bicortically, and one medial lateral screw which also engaged bicortically.  This provided excellent fixation of multiple different fracture planes.  The leg was still anatomically reduced, and given that I did this in the extended position, I did not fight with an apex anterior deformity proximally.  I then secured the nail distally using 2 interlocking bolts through the  distal tibia placed from medial to lateral.  Excellent fixation was achieved and  bicortical purchase confirmed by C-arm and direct palpation.  I then irrigated all the wounds copiously, and repaired the tissue with Vicryl followed by routine closure for the skin.  Sterile gauze and a posterior splint was applied.  She was awakened and returned to the PACU in stable satisfactory condition. There were no complications and she tolerated the procedure well.  This was fairly challenging given the comminuted and complex fracture pattern, however, I was overall satisfied with the fixation. Nonetheless, she will be nonweightbearing for a period of probably at least 6 weeks, and we will have her on Lovenox bridging to Coumadin for DVT prophylaxis as well as sequential compression devices on the contralateral limb.  There were no complications.  She tolerated the procedure well.     Eulas Post, MD     JPL/MEDQ  D:  05/01/2011  T:  05/02/2011  Job:  [id number]  Electronically Signed by Teryl Lucy MD on 05/05/2011 04:22:46 PM
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
## 2011-05-06 LAB — PROTIME-INR
INR: 1.6 — ABNORMAL HIGH (ref 0.00–1.49)
Prothrombin Time: 19.3 seconds — ABNORMAL HIGH (ref 11.6–15.2)

## 2011-06-16 ENCOUNTER — Encounter: Payer: Self-pay | Admitting: Internal Medicine

## 2011-06-16 ENCOUNTER — Encounter: Payer: 59 | Admitting: Internal Medicine

## 2011-06-16 ENCOUNTER — Ambulatory Visit (INDEPENDENT_AMBULATORY_CARE_PROVIDER_SITE_OTHER): Payer: 59 | Admitting: Internal Medicine

## 2011-06-16 VITALS — BP 131/72 | HR 64 | Temp 97.4°F | Ht 67.0 in | Wt 218.3 lb

## 2011-06-16 DIAGNOSIS — S82143A Displaced bicondylar fracture of unspecified tibia, initial encounter for closed fracture: Secondary | ICD-10-CM

## 2011-06-16 DIAGNOSIS — E785 Hyperlipidemia, unspecified: Secondary | ICD-10-CM

## 2011-06-16 DIAGNOSIS — S82109A Unspecified fracture of upper end of unspecified tibia, initial encounter for closed fracture: Secondary | ICD-10-CM

## 2011-06-16 DIAGNOSIS — F411 Generalized anxiety disorder: Secondary | ICD-10-CM

## 2011-06-16 MED ORDER — CALCIUM 600+D PLUS MINERALS 600-400 MG-UNIT PO CHEW: 1.0000 | CHEWABLE_TABLET | Freq: Every day | ORAL | Status: DC

## 2011-06-16 MED ORDER — METHOCARBAMOL 500 MG PO TABS: 500.0000 mg | ORAL_TABLET | Freq: Four times a day (QID) | ORAL | Status: DC

## 2011-06-16 MED ORDER — OXYCODONE-ACETAMINOPHEN 10-325 MG PO TABS: 1.0000 | ORAL_TABLET | ORAL | Status: DC | PRN

## 2011-06-16 MED ORDER — ALPRAZOLAM 0.5 MG PO TABS: 0.5000 mg | ORAL_TABLET | Freq: Four times a day (QID) | ORAL | Status: DC | PRN

## 2011-06-16 MED ORDER — ATORVASTATIN CALCIUM 10 MG PO TABS: 10.0000 mg | ORAL_TABLET | Freq: Every day | ORAL | Status: DC

## 2011-06-16 NOTE — Progress Notes (Signed)
Subjective:   Patient ID: Sarah [name] female   DOB: 1946-03-05 65 y.o.   MRN: [id number]  HPI: Sarah [name] is a 64 y.o. woman who presents to clinic today following her discharge from [facility] after falling and breaking her tibial plateau and fibular head.  She underwent screw, nail, and plate placement by Dr. Dion Saucier on 05/01/11.  She was then transferred to [facility] for continued rehab.  She was maintained on coumadin that was discontinued on her discharge from the rehab facility.  She has a follow up in about 2 months with Dr. Dion Saucier.  Since release she has had some pain control issues but from the leg standpoint is still non-weight bearing.    She has significant stress in her home because of her husband's antics.  She states that she can not keep up with his wandering and behaviors since the accident.  She has children who are helping but they are not available all the time.  She states that she has had to take the Xanax she was given in the SNF 3-4 times daily to help her deal with the stress.  She states that it works well.    She has started on Lipitor as while in the hospital and has been taking it without side effects.  Past Medical History  Diagnosis Date  . Obesity   . Cellulitis of left foot   . Dystrophic nail   . Abnormal electrocardiogram   . Postmenopausal status   . Peripheral edema   . Anxiety    Current Outpatient Prescriptions  Medication Sig Dispense Refill  . ALPRAZolam (XANAX) 0.5 MG tablet Take 1 tablet (0.5 mg total) by mouth 4 (four) times daily as needed for anxiety.  120 tablet  0  . atorvastatin (LIPITOR) 10 MG tablet Take 1 tablet (10 mg total) by mouth daily.  30 tablet  6  . methocarbamol (ROBAXIN) 500 MG tablet Take 1 tablet (500 mg total) by mouth 4 (four) times daily.  120 tablet  0  . oxyCODONE-acetaminophen (PERCOCET) 10-325 MG per tablet Take 1 tablet by mouth every 4 (four) hours as needed.  180 tablet  0  . Calcium Carbonate-Vit  D-Min (CALCIUM 600+D PLUS MINERALS) 600-400 MG-UNIT CHEW Chew 1 tablet by mouth daily.  30 each  6   Family History  Problem Relation Age of Onset  . Diabetes Mother   . Hypertension Mother   . Aneurysm Mother   . Cancer Father   . Hypertension Daughter    History   Social History  . Marital Status: Married    Spouse Name: N/A    Number of Children: N/A  . Years of Education: N/A   Social History Main Topics  . Smoking status: Never Smoker   . Smokeless tobacco: None  . Alcohol Use: No  . Drug Use: No  . Sexually Active: None   Other Topics Concern  . None   Social History Narrative    Patient is married, a Advertising copywriter, and lives in [place]. The patient says is his regular exercise, and used to play bowling.   Review of Systems: Constitutional: Denies fever, chills, diaphoresis, appetite change and fatigue.  HEENT: Denies photophobia, eye pain, redness, hearing loss, ear pain, congestion, sore throat, rhinorrhea, sneezing, mouth sores, trouble swallowing, neck pain, neck stiffness and tinnitus.   Respiratory: Denies SOB, DOE, cough, chest tightness,  and wheezing.   Cardiovascular: Denies chest pain, palpitations and leg swelling.  Gastrointestinal: Denies nausea, vomiting,  abdominal pain, diarrhea, constipation, blood in stool and abdominal distention.  Genitourinary: Denies dysuria, urgency, frequency, hematuria, flank pain and difficulty urinating.  Musculoskeletal: Denies myalgias, back pain, joint swelling, arthralgias and gait problem.  Skin: Denies pallor, rash and wound.  Neurological: Denies dizziness, seizures, syncope, weakness, light-headedness, numbness and headaches.  Hematological: Denies adenopathy. Easy bruising, personal or family bleeding history  Psychiatric/Behavioral: Positive for nervousness.  Denies suicidal ideation, mood changes, confusion, sleep disturbance and agitation  Objective:  Physical Exam: Filed Vitals:   06/16/11 1326  BP:  131/72  Pulse: 64  Temp: 97.4 F (36.3 C)  TempSrc: Oral  Height: 5\' 7"  (1.702 m)  Weight: 218 lb 4.8 oz (99.02 kg)  SpO2: 100%   Constitutional: Vital signs reviewed.  Patient is a well-developed and well-nourished woman in no acute distress and cooperative with exam. Alert and oriented x3. She is in a wheelchair with a walker beside her.  Head: Normocephalic and atraumatic Ear: TM normal bilaterally Mouth: no erythema or exudates, MMM Eyes: PERRL, EOMI, conjunctivae normal, No scleral icterus.  Neck: Supple, Trachea midline normal ROM, No JVD, mass, thyromegaly, or carotid bruit present.  Cardiovascular: RRR, S1 normal, S2 normal, no MRG, pulses symmetric and intact bilaterally Pulmonary/Chest: CTAB, no wheezes, rales, or rhonchi Abdominal: Soft. Non-tender, non-distended, bowel sounds are normal, no masses, organomegaly, or guarding present.  GU: no CVA tenderness Musculoskeletal: There is a boot cast on the left leg from the knee down.  She is able to wiggle toes and sensation is intact.  No joint deformities, erythema, or stiffness, ROM full and no nontender Hematology: no cervical, inginal, or axillary adenopathy.  Neurological: A&O x3, Strength is normal and symmetric bilaterally, cranial nerve II-XII are grossly intact, no focal motor deficit, sensory intact to light touch bilaterally.  Skin: Warm, dry and intact. No rash, cyanosis, or clubbing.  Psychiatric: normal mood and flat to tearful affect. speech and behavior is normal. Judgment and thought content normal. Cognition and memory are normal.   Assessment & Plan:

## 2011-06-16 NOTE — Patient Instructions (Addendum)
1. Continue all your medication and rehabilitation as directed by Dr. Dion Saucier.  2.  Continue your cholesterol medication.  3.  Follow up in about 2 months.

## 2011-06-18 NOTE — Assessment & Plan Note (Signed)
She is followed by Dr. Dion Saucier.  We will refill her pain medication today but we also discussed that she should continue to get her medication from Dr. Dion Saucier since he is the one who prescribed it previously and will be the one that decides when she is healed enough to not need it anymore.

## 2011-06-18 NOTE — Assessment & Plan Note (Signed)
She has situational anxiety and stress secondary to her recent fracture and her inability to care for her husband.  We discussed PACE and getting more help around the house.  She will consider it and contact them to look into it.  We will continue with the Xanax to help her deal with the situation but we discussed that it only covers the symptoms not fixing the underlying problems which is the stress she has from his condition.

## 2011-06-18 NOTE — Assessment & Plan Note (Addendum)
Lab Results  Component Value Date   CHOL 199 10/16/2008   Lab Results  Component Value Date   HDL 46 10/16/2008   Lab Results  Component Value Date   LDLCALC 132* 10/16/2008   Lab Results  Component Value Date   TRIG 104 10/16/2008   Lab Results  Component Value Date   CHOLHDL 4.3 Ratio 10/16/2008   No results found for this basename: LDLDIRECT   She has 2 risk factors including age and family history of CAD.  We will shoot for a goal of LDL <130 and recheck her cholesterol in a few months to follow.

## 2011-06-20 ENCOUNTER — Telehealth: Payer: Self-pay | Admitting: *Deleted

## 2011-06-20 MED ORDER — PSYLLIUM 28 % PO PACK: PACK | ORAL | Status: DC

## 2011-06-20 NOTE — Telephone Encounter (Signed)
Call from pt said that her medications are causing her to be constipated.  Has tried Ducolax OTC with no relief.  Had a small stool yesterday after using an Enema.  Would like to get something for the constipation.  Uses [facility].  Angelina Ok, RN 06/20/2011 10:38 AM.                                                                                                                                                                                   Call from pt said that the medications that she is on are causing her to have constipation.  Pt is on med for pain.  Said that she has tried Doculax OTC with no relief.  Did an enema yesterday and had a small stool.  Would like to get something called to [facility] [address].  Angelina Ok, RN 06/20/2011 10:44 AM

## 2011-06-20 NOTE — Telephone Encounter (Signed)
Several attempts to call pt to notify her of the need to start Metamucil.  Unable to reach by phone.  Angelina Ok, RN 3:02 PM.

## 2011-06-20 NOTE — Telephone Encounter (Signed)
Would advise that she start psyllium (Metamucil) one packet in an 8 oz glass of water twice a day as needed for constipation.  Please have her call next week and tell us how she is doing.  She should see a physician if she develops abdominal pain, nausea, vomiting, or other concerning symptoms.

## 2011-07-21 ENCOUNTER — Other Ambulatory Visit: Payer: Self-pay | Admitting: *Deleted

## 2011-07-21 DIAGNOSIS — F411 Generalized anxiety disorder: Secondary | ICD-10-CM

## 2011-07-21 MED ORDER — ALPRAZOLAM 0.5 MG PO TABS: 0.5000 mg | ORAL_TABLET | Freq: Three times a day (TID) | ORAL | Status: DC | PRN

## 2011-08-08 ENCOUNTER — Other Ambulatory Visit: Payer: Self-pay | Admitting: Internal Medicine

## 2011-08-08 DIAGNOSIS — S82143A Displaced bicondylar fracture of unspecified tibia, initial encounter for closed fracture: Secondary | ICD-10-CM

## 2011-08-08 NOTE — Telephone Encounter (Signed)
Dr Tonny Branch filled med as a curteousy in Dec but told her that she needed to get future refills from ortho as they were managing the fracture which is reason she needs opioids.

## 2011-08-08 NOTE — Telephone Encounter (Signed)
Pt states she has only 4 tablets left of each medications.

## 2011-08-08 NOTE — Telephone Encounter (Signed)
Pt was made awared of Robaxin rx refill and to call Ortho MD about Percocet refill. Stated she will.

## 2011-09-15 ENCOUNTER — Other Ambulatory Visit: Payer: Self-pay | Admitting: Internal Medicine

## 2011-09-16 NOTE — Telephone Encounter (Signed)
Rx called in 

## 2011-10-09 ENCOUNTER — Telehealth: Payer: Self-pay | Admitting: *Deleted

## 2011-10-09 NOTE — Telephone Encounter (Addendum)
Pt calls c/o muscle stiffness and pain, she would like you to call her to discuss this at [phone]  Spoke with pt regarding pain meds.  Advised to titrate tramadol from 50mg  up to 100mg  since she still feels some pain with ambulation in her leg.  Not taking oxycodone anymore.

## 2011-10-13 ENCOUNTER — Other Ambulatory Visit: Payer: Self-pay | Admitting: Internal Medicine

## 2011-10-13 MED ORDER — TRAMADOL HCL 50 MG PO TABS: 100.0000 mg | ORAL_TABLET | Freq: Three times a day (TID) | ORAL | Status: DC | PRN

## 2011-10-13 MED ORDER — ALPRAZOLAM 0.5 MG PO TABS: 0.5000 mg | ORAL_TABLET | Freq: Three times a day (TID) | ORAL | Status: DC | PRN

## 2011-10-14 ENCOUNTER — Other Ambulatory Visit: Payer: Self-pay | Admitting: Internal Medicine

## 2011-10-14 NOTE — Telephone Encounter (Signed)
Hi Gayle-  I think I refilled this yesterday on 10/13/11.  Please correct me if I'm wrong and I'll be happy to refill.  Thanks, [employer]

## 2011-10-15 NOTE — Telephone Encounter (Signed)
Yes, you are correct this med was refilled yesterday BUT it came [facility] so you need to "deny, just filled" and send back to them. Thanks Jannie Doyle

## 2011-10-23 ENCOUNTER — Other Ambulatory Visit: Payer: Self-pay | Admitting: *Deleted

## 2011-10-27 MED ORDER — METHOCARBAMOL 500 MG PO TABS: 500.0000 mg | ORAL_TABLET | Freq: Four times a day (QID) | ORAL | Status: DC

## 2011-11-26 ENCOUNTER — Other Ambulatory Visit: Payer: Self-pay | Admitting: *Deleted

## 2011-11-26 DIAGNOSIS — S82143A Displaced bicondylar fracture of unspecified tibia, initial encounter for closed fracture: Secondary | ICD-10-CM

## 2011-11-26 MED ORDER — CALCIUM 600+D PLUS MINERALS 600-400 MG-UNIT PO CHEW: 1.0000 | CHEWABLE_TABLET | Freq: Every day | ORAL | Status: DC

## 2011-12-28 ENCOUNTER — Other Ambulatory Visit: Payer: Self-pay | Admitting: Internal Medicine

## 2011-12-29 NOTE — Telephone Encounter (Signed)
Rx called in to pharmacy per Dr Abner Greenspan. Stanton Kidney Debbi Strandberg RN 12/29/11 5PM

## 2012-04-12 ENCOUNTER — Encounter: Payer: Self-pay | Admitting: Internal Medicine

## 2012-04-12 ENCOUNTER — Ambulatory Visit (INDEPENDENT_AMBULATORY_CARE_PROVIDER_SITE_OTHER): Payer: Medicare HMO | Admitting: Internal Medicine

## 2012-04-12 VITALS — BP 138/78 | HR 86 | Temp 98.8°F | Ht 67.0 in | Wt 224.7 lb

## 2012-04-12 DIAGNOSIS — Z23 Encounter for immunization: Secondary | ICD-10-CM

## 2012-04-12 DIAGNOSIS — G8929 Other chronic pain: Secondary | ICD-10-CM

## 2012-04-12 DIAGNOSIS — Z0000 Encounter for general adult medical examination without abnormal findings: Secondary | ICD-10-CM

## 2012-04-12 DIAGNOSIS — S82109A Unspecified fracture of upper end of unspecified tibia, initial encounter for closed fracture: Secondary | ICD-10-CM

## 2012-04-12 DIAGNOSIS — E785 Hyperlipidemia, unspecified: Secondary | ICD-10-CM

## 2012-04-12 DIAGNOSIS — S82143A Displaced bicondylar fracture of unspecified tibia, initial encounter for closed fracture: Secondary | ICD-10-CM

## 2012-04-12 DIAGNOSIS — F411 Generalized anxiety disorder: Secondary | ICD-10-CM

## 2012-04-12 HISTORY — DX: Other chronic pain: G89.29

## 2012-04-12 MED ORDER — DULOXETINE HCL 30 MG PO CPEP: 30.0000 mg | ORAL_CAPSULE | Freq: Every day | ORAL | Status: DC

## 2012-04-12 NOTE — Assessment & Plan Note (Signed)
Takes xanax prn. Frustrated by pain and current situation being unable to work or walk around the house.  -will start cymbalta trial today to assist hopefully with chronic pain and anxiety  -continue to monitor, encouraged to see [facility] as needed for further assistance

## 2012-04-12 NOTE — Progress Notes (Signed)
Subjective:   Patient ID: Sarah [name] female   DOB: 1946/01/27 66 y.o.   MRN: [id number]  HPI: [name] is a 66 y.o. African American obese female with PMH of tibial fx October 2012 by Dr. Dion Saucier and anxiety presenting to clinic today for routine follow up and complaints of diffuse body pain, lower extremity edema bilaterally LLE>RLE, and stiffness.  She claims her pain gets so bad that she has trouble getting out of bed in the mornings worse in her lower extremities.  [name] claims to have recently seen Dr. Dion Saucier last week where repeat xrays of LLE done, we'll need to obtain those records. She claims Dr. Dion Saucier has explained to her that her injury and surgery has healed. She is not given any pain medication from him. She claims to get all her medications from our clinic. She occasionally takes Tylenol which gives her some relief. She complains of morning stiffness, difficulty sitting down and standing up, and lower extremity edema. 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 She has tried physical therapy in the past however can afford the copayment. She also complains of occasional numbness in her left lower extremity. Ms. Bourassa is trying to get Social Security benefits however last angioplasty was denied. She is continuing to work on the process. She admits to being anxious and frustrated especially at her current physical condition being unable to work and barely walk. She uses the assistance of a cane. Otherwise she denies any fever, chills, nausea, vomiting, diarrhea, chest pain, shortness of breath, abdominal pain, or any urinary complaints at this time.  Past Medical History  Diagnosis Date  . Obesity   . Cellulitis of left foot   . Dystrophic nail   . Abnormal electrocardiogram   . Postmenopausal status   . Peripheral edema   . Anxiety    Current Outpatient Prescriptions  Medication Sig Dispense Refill  . ALPRAZolam (XANAX) 0.5 MG  tablet TAKE 1 TABLET BY MOUTH THREE TIMES DAILY AS NEEDED FOR ANXIETY OR SLEEP  90 tablet  1  . atorvastatin (LIPITOR) 10 MG tablet Take 1 tablet (10 mg total) by mouth daily.  30 tablet  6  . Calcium Carbonate-Vit D-Min (CALCIUM 600+D PLUS MINERALS) 600-400 MG-UNIT CHEW Chew 1 tablet by mouth daily.  30 each  11  . methocarbamol (ROBAXIN) 500 MG tablet Take 1 tablet (500 mg total) by mouth 4 (four) times daily.  120 tablet  3  . psyllium (METAMUCIL SMOOTH TEXTURE) 28 % packet Take 1 packet in an 8 oz glass of water twice a day as needed for constipation.  Do not take within 2 hours of taking other medications.  30 packet  2  . DULoxetine (CYMBALTA) 30 MG capsule Take 1 capsule (30 mg total) by mouth daily. Take 1 capsule 30mg  total by mouth daily for one week and then slowly increase to 60mg  ( two capsules) by mouth daily total after the first week  60 capsule  1   Family History  Problem Relation Age of Onset  . Diabetes Mother   . Hypertension Mother   . Aneurysm Mother   . Cancer Father   . Hypertension Daughter    History   Social History  . Marital Status: Married    Spouse Name: N/A    Number of Children: N/A  . Years of Education: N/A   Social History Main Topics  . Smoking status: Never Smoker   .  Smokeless tobacco: None  . Alcohol Use: No  . Drug Use: No  . Sexually Active: None   Other Topics Concern  . None   Social History Narrative    Patient is married, a Advertising copywriter, and lives in [place]. The patient says is his regular exercise, and used to play bowling.   Review of Systems: Constitutional: Denies fever, chills, diaphoresis, appetite change and fatigue.  HEENT: Denies photophobia, eye pain, redness, hearing loss, ear pain, congestion, sore throat, rhinorrhea, sneezing, mouth sores, trouble swallowing, neck pain, neck stiffness and tinnitus.   Respiratory: Denies SOB, DOE, cough, chest tightness,  and wheezing.   Cardiovascular: Denies chest pain,  palpitations and leg swelling.  Gastrointestinal: Denies nausea, vomiting, abdominal pain, diarrhea, constipation, blood in stool and abdominal distention.  Genitourinary: Denies dysuria, urgency, frequency, hematuria, flank pain and difficulty urinating.  Musculoskeletal: Diffuse body pain, LLE worse, b/l lower extremity edema, difficulty walking, support of cane.  Skin: Denies pallor, rash and wound.  Neurological: Denies dizziness, seizures, syncope, weakness, light-headedness, numbness and headaches.  Hematological: Denies adenopathy. Easy bruising, personal or family bleeding history  Psychiatric/Behavioral: Anxiety. Denies suicidal ideation, mood changes, confusion, nervousness, sleep disturbance and agitation  Objective:  Physical Exam: Filed Vitals:   04/12/12 0940  BP: 138/78  Pulse: 86  Temp: 98.8 F (37.1 C)  TempSrc: Oral  Height: 5\' 7"  (1.702 m)  Weight: 224 lb 11.2 oz (101.923 kg)  SpO2: 98%   Constitutional: Vital signs reviewed.  Patient is a well-developed and obese female in acute painful distress and cooperative with exam. Alert and oriented x3.  Head: Normocephalic and atraumatic Ear: TM normal bilaterally Mouth: no erythema or exudates, MMM Eyes: PERRL, EOMI, conjunctivae normal, No scleral icterus.  Neck: Supple, Trachea midline normal ROM Cardiovascular: RRR, S1 normal, S2 normal, no MRG, pulses symmetric and intact bilaterally Pulmonary/Chest: CTAB, no wheezes, rales, or rhonchi Abdominal: Soft. Non-tender, obese non-distended, bowel sounds are normal, no masses, organomegaly, or guarding present.  GU: no CVA tenderness Musculoskeletal: + b/l lower extremity edema, +1 pitting edema, +1-2 dp intact, + chronic skin changes--likely chronic vascular insufficiency and s/p surgery of LLE. LLE worse than RLE in complaints of pain and edema. Limited ROM due to c/o pain, - tenderness to palpation. Difficulty walking due to c/o pain and stiffness. Walks with  cane. Hematology: no cervical, inginal, or axillary adenopathy.  Neurological: A&O x3, Strength is normal and symmetric bilaterally, cranial nerve II-XII are grossly intact, no focal motor deficit, sensory intact to light touch bilaterally.  Skin: Warm, dry and intact. No rash, cyanosis, or clubbing.  Psychiatric: Normal mood and affect. speech and behavior is normal. Judgment and thought content normal. Cognition and memory are normal.   Assessment & Plan:  Discussed with Dr. Rogelia Boga  Chronic pain--will try cymbalta 30mg  daily for first week and titrate up to 60mg  daily as tolerated for following weeks--1 month trial  Tibial fx--04/2011: f/u Dr. Dion Saucier, review xrays from last week and recommendations  Refill left without getting routine labs drawn, consider getting a CBC CMP and followup visit. Last labs from 2012

## 2012-04-12 NOTE — Assessment & Plan Note (Addendum)
Complaining of worsening diffuse body pain in all extremities but worse in LLE  S/p surgery.  Significant impairment in ability to walk and sit. Walks with assistance of cane, chronic b/l vascular insufficiency likely venous in lower extremities, lower extremity edema b/l--pitting, stiffness in joints of hands and hips.  Likely component of arthritis as well.  DVT seems unlikely at this time, will hold on dopplers of lower extremity at this time as chronic condition seems likely, distal pulses in tact.  Has tried tramadol to some relief.    -f/u lower extremity xray and recommendations of Dr. Dion Saucier -cant afford PT copayment, given list of free or low cost resources from social work -will try cymbalta 30mg  daily first week and titrate up to 60mg  daily for following weeks as tolerated--hopeful assistance with pain and anxiety. D/c tramadol for now and try cymbalta. -f/u one month -encouraged weight loss

## 2012-04-12 NOTE — Assessment & Plan Note (Signed)
Claims to have seen Dr. Dion Saucier last Wednesday and got xrays of left leg done--will need to f/u on images and his note  Continues to complain of pain despite being told leg is healed, visible lower extremity edema along with diffuse body pain  -f/u Dr Dion Saucier and results from office visit and xrays -will try cymbalta for possible assistance of chronic pain

## 2012-04-12 NOTE — Patient Instructions (Signed)
Stop tramadol, switch to cymbalta--30mg  daily for the first week and then slowly increase to 60mg  daily for one month  Return to clinic for f/u one month  F/u with Dr. Dion Saucier  Consider physical therapy exercises we reviewed at low cost  Weight loss

## 2012-04-12 NOTE — Assessment & Plan Note (Signed)
On lipitor 10 mg

## 2012-04-14 NOTE — Progress Notes (Signed)
INTERNAL MEDICINE TEACHING ATTENDING ADDENDUM - I personally saw and evaluated the patient in this clinic visit in conjunction with the resident, Dr. Qureshi. I have discussed the patient's plan of care with Dr. Qureshi during this visit. I have confirmed the physical exam findings and have read and agree with the clinic note including the plan 

## 2012-04-15 ENCOUNTER — Telehealth: Payer: Self-pay | Admitting: *Deleted

## 2012-04-15 NOTE — Telephone Encounter (Signed)
Pt called stating her med was changed at last visit 9/30 She was started on cymbalta 30 mg and it cost $90.  She can't afford this. Please call pt @ [phone]

## 2012-04-16 ENCOUNTER — Telehealth: Payer: Self-pay | Admitting: Internal Medicine

## 2012-04-16 ENCOUNTER — Other Ambulatory Visit: Payer: Self-pay | Admitting: Internal Medicine

## 2012-04-16 MED ORDER — AMITRIPTYLINE HCL 25 MG PO TABS: 25.0000 mg | ORAL_TABLET | Freq: Every day | ORAL | Status: DC

## 2012-04-16 NOTE — Telephone Encounter (Signed)
Called Ms. Vadala and left voicemail at the phone number she left for call back. No answer. Will have RN Merrie Roof) call back on monday as well with change in medication due to cost. Discontinued cymbalta and changed to elavil 25mg  [facility] trial for 30 days which is on the $4 list at [facility].

## 2012-04-16 NOTE — Telephone Encounter (Signed)
Sorry to hear that its that expensive. We originally thought insurance may cover it.  I have switched it to Elavil 25mg  daily that is on the 4 dollar list at [facility] and placed the order for the medication for one month trial. Please let her know. Discontinued the Cymbalta.   Thank you

## 2012-04-19 NOTE — Telephone Encounter (Signed)
Pt informed and voices understanding.  She will d/c cymbalta and tramadol.  She will start Elavil 25 mg and let us know how well it is working for her.

## 2012-04-19 NOTE — Telephone Encounter (Signed)
Thanks

## 2012-04-26 ENCOUNTER — Encounter: Payer: Self-pay | Admitting: Gastroenterology

## 2012-05-20 ENCOUNTER — Ambulatory Visit: Payer: Medicare HMO | Admitting: Internal Medicine

## 2012-05-24 ENCOUNTER — Other Ambulatory Visit: Payer: Self-pay | Admitting: *Deleted

## 2012-05-24 MED ORDER — METHOCARBAMOL 500 MG PO TABS: 500.0000 mg | ORAL_TABLET | Freq: Four times a day (QID) | ORAL | Status: DC

## 2012-05-26 ENCOUNTER — Ambulatory Visit (INDEPENDENT_AMBULATORY_CARE_PROVIDER_SITE_OTHER): Payer: Medicare HMO | Admitting: Internal Medicine

## 2012-05-26 ENCOUNTER — Ambulatory Visit: Payer: Medicare HMO | Admitting: Internal Medicine

## 2012-05-26 VITALS — BP 140/73 | HR 92 | Temp 97.4°F | Ht 66.9 in | Wt 228.7 lb

## 2012-05-26 DIAGNOSIS — G8929 Other chronic pain: Secondary | ICD-10-CM

## 2012-05-26 DIAGNOSIS — D649 Anemia, unspecified: Secondary | ICD-10-CM

## 2012-05-26 DIAGNOSIS — F411 Generalized anxiety disorder: Secondary | ICD-10-CM

## 2012-05-26 DIAGNOSIS — M25569 Pain in unspecified knee: Secondary | ICD-10-CM

## 2012-05-26 DIAGNOSIS — E785 Hyperlipidemia, unspecified: Secondary | ICD-10-CM

## 2012-05-26 DIAGNOSIS — S82143A Displaced bicondylar fracture of unspecified tibia, initial encounter for closed fracture: Secondary | ICD-10-CM

## 2012-05-26 LAB — CBC
Hemoglobin: 12.7 g/dL (ref 12.0–15.0)
MCH: 30.4 pg (ref 26.0–34.0)
MCHC: 33.6 g/dL (ref 30.0–36.0)
MCV: 90.4 fL (ref 78.0–100.0)
Platelets: 278 10*3/uL (ref 150–400)
RBC: 4.18 MIL/uL (ref 3.87–5.11)

## 2012-05-26 LAB — PATHOLOGIST SMEAR REVIEW

## 2012-05-26 MED ORDER — AMITRIPTYLINE HCL 25 MG PO TABS: 50.0000 mg | ORAL_TABLET | Freq: Every day | ORAL | Status: DC

## 2012-05-26 MED ORDER — ATORVASTATIN CALCIUM 10 MG PO TABS: 10.0000 mg | ORAL_TABLET | Freq: Every day | ORAL | Status: DC

## 2012-05-26 MED ORDER — ALPRAZOLAM 0.5 MG PO TABS: 0.5000 mg | ORAL_TABLET | Freq: Every evening | ORAL | Status: DC | PRN

## 2012-05-26 MED ORDER — TRAMADOL HCL 50 MG PO TABS: 50.0000 mg | ORAL_TABLET | Freq: Four times a day (QID) | ORAL | Status: DC | PRN

## 2012-05-26 NOTE — Progress Notes (Signed)
Patient ID: Sarah [name], female   DOB: 1945-08-30, 66 y.o.   MRN: [id number]  Subjective:   Patient ID: Sarah [name] female   DOB: [birth year] 66 y.o.   MRN: [id number]  HPI: Sarah [name] is a 66 y.o. woman with past medical history of obesity, and history of fracture of the left Tibia in 2012 October presents to the clinic for a followup visit due to her pain in the left flank. The patient reports that since her operation in 2012 she has continued to experience pain, mainly in her left leg assessment of this leg, with x-rays revealed complete healing of her fracture.  She described her pain as severe and spit coming increasingly difficult to do activities of daily living. Unfortunately, Sarah [name] does not have insurance, and she has difficulties getting her medications. She has been taking tramadol about 3-4 pills per day without much relief. In addition, Sarah [name] is the primary caretaker of her husband, who is bedbound at home.  She does not have other complaints.    Past Medical History  Diagnosis Date  . Obesity   . Cellulitis of left foot   . Dystrophic nail   . Abnormal electrocardiogram   . Postmenopausal status   . Peripheral edema   . Anxiety    Current Outpatient Prescriptions  Medication Sig Dispense Refill  . ALPRAZolam (XANAX) 0.5 MG tablet Take 1 tablet (0.5 mg total) by mouth at bedtime as needed for sleep.  90 tablet  1  . amitriptyline (ELAVIL) 25 MG tablet Take 2 tablets (50 mg total) by mouth at bedtime.  30 tablet  1  . atorvastatin (LIPITOR) 10 MG tablet Take 1 tablet (10 mg total) by mouth daily.  30 tablet  6  . Calcium Carbonate-Vit D-Min (CALCIUM 600+D PLUS MINERALS) 600-400 MG-UNIT CHEW Chew 1 tablet by mouth daily.  30 each  11  . psyllium (METAMUCIL SMOOTH TEXTURE) 28 % packet Take 1 packet in an 8 oz glass of water twice a day as needed for constipation.  Do not take within 2 hours of taking other medications.  30 packet  2  . methocarbamol (ROBAXIN)  500 MG tablet Take 1 tablet (500 mg total) by mouth 4 (four) times daily.  120 tablet  3  . traMADol (ULTRAM) 50 MG tablet Take 1 tablet (50 mg total) by mouth every 6 (six) hours as needed for pain.  60 tablet  1   Family History  Problem Relation Age of Onset  . Diabetes Mother   . Hypertension Mother   . Aneurysm Mother   . Cancer Father   . Hypertension Daughter    History   Social History  . Marital Status: Married    Spouse Name: N/A    Number of Children: N/A  . Years of Education: N/A   Social History Main Topics  . Smoking status: Never Smoker   . Smokeless tobacco: Not on file  . Alcohol Use: No  . Drug Use: No  . Sexually Active: Not on file   Other Topics Concern  . Not on file   Social History Narrative    Patient is married, a Advertising copywriter, and lives in [place]. The patient says is his regular exercise, and used to play bowling.   Review of Systems: Review of Systems  Constitutional: Negative.   HENT: Negative.   Eyes: Negative.   Respiratory: Negative for cough, hemoptysis, sputum production, shortness of breath and wheezing.   Cardiovascular: Negative for  chest pain, palpitations, orthopnea, claudication, leg swelling and PND.  Gastrointestinal: Negative.   Genitourinary: Negative for dysuria, urgency, frequency, hematuria and flank pain.  Skin: Negative for itching and rash.  Neurological: Negative.   Psychiatric/Behavioral: Negative.     Objective:  Physical Exam: Filed Vitals:   05/26/12 0920  BP: 140/73  Pulse: 92  Temp: 97.4 F (36.3 C)  TempSrc: Oral  Height: 5' 6.9" (1.699 m)  Weight: 228 lb 11.2 oz (103.738 kg)  SpO2: 99%   Physical Exam  Constitutional: She is oriented to person, place, and time. No distress.  HENT:  Head: Normocephalic and atraumatic.  Eyes: Pupils are equal, round, and reactive to light.  Neck: Normal range of motion.  Cardiovascular: Normal rate, regular rhythm, normal heart sounds and intact distal  pulses.  Exam reveals no gallop and no friction rub.   No murmur heard. Pulmonary/Chest: Effort normal and breath sounds normal. No respiratory distress. She has no wheezes. She has no rales. She exhibits no tenderness.  Abdominal: Soft. Bowel sounds are normal. She exhibits no distension and no mass. There is no tenderness. There is no rebound and no guarding.  Musculoskeletal: She exhibits no edema.       Left lower leg: She exhibits tenderness and bony tenderness. She exhibits no swelling, no edema, no deformity and no laceration.       No signs of infection DVT like swelling or erythema. Pulse are normal.  Neurological: She is alert and oriented to person, place, and time.  Skin: Skin is warm and dry. She is not diaphoretic.  Psychiatric: Affect normal.    Assessment & Plan:  I have discussed the assessment and plan for the care of Sarah [name] with Dr Sarah [name] as detailed under the problem based charting.

## 2012-05-26 NOTE — Patient Instructions (Addendum)
Please increase the dose of Amitriptylline to 50 mg once daily at bedtime to help with your pain  Please start taking Tramadol for your pain  We have referred you to the pain clinic to better manage your pain  In future and when possible, we will consider starting physiotherapy since it is very good treatment of your pain  I will contact you regarding the blood check we have done today. Please come back to the clinic in 2 month Please call to schedule your appointment for mammogram and pap-smear

## 2012-05-27 ENCOUNTER — Encounter: Payer: Self-pay | Admitting: Internal Medicine

## 2012-05-27 NOTE — Assessment & Plan Note (Signed)
Sarah [name] has continued to experience pain in her left leg. She was unable to afford cymbalta due to cost. She was reviewed by Dr. Landau who recommended psychosocial approach to this pain which Sarah [name] is experiencing and possible benefit with physical therapy. I believe part of this 'pain' could be due to the suffering with the responsibility of taking care of her husband who is bedbound.   Plan  - Will refer her to pain clinic, Shana will assist Sarah [name] on this referral given that he insurance may not cove it. - Start taking tramadol  - I have increased her Amtriptylline from 25 to 50 mg once daily as this might have some analgesic effect. 

## 2012-05-27 NOTE — Assessment & Plan Note (Deleted)
Sarah [name] has continued to experience pain in her left leg. She was unable to afford cymbalta due to cost. She was reviewed by Dr. Dion Saucier who recommended psychosocial approach to this pain which Sarah [name] is experiencing and possible benefit with physical therapy. I believe part of this 'pain' could be due to the suffering with the responsibility of taking care of her husband who is bedbound.   Plan  - Will refer her to pain clinic, Edson Snowball will assist Sarah [name] on this referral given that he insurance may not cove it. - Start taking tramadol  - I have increased her Amtriptylline from 25 to 50 mg once daily as this might have some analgesic effect.

## 2012-06-01 NOTE — Addendum Note (Signed)
Addended by: Neomia Dear on: 06/01/2012 06:52 PM   Modules accepted: Orders

## 2012-06-15 ENCOUNTER — Other Ambulatory Visit: Payer: Medicare HMO | Admitting: Gastroenterology

## 2012-06-16 NOTE — Addendum Note (Signed)
Addended by: Neomia Dear on: 06/16/2012 08:40 AM   Modules accepted: Orders

## 2012-07-09 ENCOUNTER — Encounter: Payer: Self-pay | Admitting: Internal Medicine

## 2012-07-09 ENCOUNTER — Ambulatory Visit (INDEPENDENT_AMBULATORY_CARE_PROVIDER_SITE_OTHER): Payer: Medicare HMO | Admitting: Internal Medicine

## 2012-07-09 VITALS — BP 156/82 | HR 102 | Temp 97.1°F | Ht 66.0 in | Wt 226.6 lb

## 2012-07-09 DIAGNOSIS — G8929 Other chronic pain: Secondary | ICD-10-CM

## 2012-07-09 DIAGNOSIS — Z0000 Encounter for general adult medical examination without abnormal findings: Secondary | ICD-10-CM | POA: Insufficient documentation

## 2012-07-09 DIAGNOSIS — Z23 Encounter for immunization: Secondary | ICD-10-CM

## 2012-07-09 DIAGNOSIS — R030 Elevated blood-pressure reading, without diagnosis of hypertension: Secondary | ICD-10-CM

## 2012-07-09 MED ORDER — GABAPENTIN 100 MG PO CAPS: 100.0000 mg | ORAL_CAPSULE | Freq: Every day | ORAL | Status: DC

## 2012-07-09 MED ORDER — ZOSTER VACCINE LIVE 19400 UNT/0.65ML ~~LOC~~ SOLR: 0.6500 mL | Freq: Once | SUBCUTANEOUS | Status: DC

## 2012-07-09 NOTE — Assessment & Plan Note (Signed)
Pertinent Data:  XR Left Leg (04/2011) - Internal fixation of the comminuted tibial fracture. Proximal fibular shaft fracture remains slightly displaced.  Two View Left Knee and Left tibia/fibula (03/2012) - performed at Dr. Shelba Flake clinic - acceptable near anatomic alignment of the fracture fragments at the knee joint with acceptable placement of the intermedullary nail and independent lag screws. There are no signs of loosening or failure of any hardware. There is equal preservation of joint space in both compartments in her left leg as compared to her right.   Assessment: Cause of the patient's ongoing pain is unclear, doesn't seem to specifically follow any dermatomal distribution or nerve distribution per my exam. I wonder if this could be a possible complex regional pain syndrome after her surgery, particularly given the hyperesthesias, intermittent swelling. She has only had minimal benefit with Elavil and feels that it can make her feel like a zombie, therefore I do not think escalation of this therapy would be appropriate. However, given longitudinal nature of her pain, I do think that additional workup is warranted. She has not followed with physical therapy or with pain management 2/2 cost.  Plan:      Start gabapentin each bedtime - patient was advised that this medication a sedative, therefore, she should avoid driving or operating heavy machinery, she should avoid taking with alcohol. She is advised that there can be side effects in combination with Elavil, therefore she should be extra prudent of these.   Will refer to neurology, for additional evaluation with consideration for nerve conduction studies.  Patient informed of the red flag symptoms that should prompt her immediate return for reevaluation - such as, but not limited to the following: Bowel or bladder incontinence, saddle anesthesias, weakness in lower extremities. She expresses understanding of the information provided.

## 2012-07-09 NOTE — Progress Notes (Signed)
Patient: Sarah [name]   MRN: [id number]  DOB: 05-26-1946  PCP: Jonah Blue, DO   Subjective:    HPI: Sarah [name] is a 66 y.o. female with a PMHx of anxiety, HLD, chronic diffuse LLE who presented to clinic today for the following:  1) Chronic left foot and leg pain - patient indicates that she has had 1 year history of stable left dorsal and plantar foot pain and anterior/posterior leg pain that started after sustaining a tibial fracture. Pain described as history of stable, constant but occasionally better or worse depending on what she is doing, pain is described aching pain that is confined to the left leg, without radiation.  Currently, the pain is rated a 7/10 in severity. At its worst, the pain is rated a 10/10 in severity. Aggravated by walking on the involved or standing for prolonged time, alleviated by elevating legs, putting heat or cold. No weakness of lower extremities, numbness, change in color of skin, change in temperature of skin, urinary or bowel incontinence, saddle anesthesias, new open sores, lacerations, no recent trauma, no involved redness or warmth. Does confirm edema after prolonged activity.   Has been evaluated on multiple occasions for this left lower extremity pain. In September 2013, she was initially intended to start Cymbalta, however could not afford this therapy. Therefore, instead, she was started on Elavil 25 mg, which was escalated to 50 mg in November 2013. She has had only mild improvement of symptoms with this therapy. She was last seen by her orthopedist 03/2012     Review of Systems: Per HPI.   Current Outpatient Medications: Medication Sig  . ALPRAZolam (XANAX) 0.5 MG tablet Take 1 tablet (0.5 mg total) by mouth at bedtime as needed for sleep.  Marland Kitchen amitriptyline (ELAVIL) 25 MG tablet Take 2 tablets (50 mg total) by mouth at bedtime.  Marland Kitchen atorvastatin (LIPITOR) 10 MG tablet Take 1 tablet (10 mg total) by mouth daily.  . Calcium Carbonate-Vit  D-Min (CALCIUM 600+D PLUS MINERALS) 600-400 MG-UNIT CHEW Chew 1 tablet by mouth daily.  . traMADol (ULTRAM) 50 MG tablet Take 1 tablet (50 mg total) by mouth every 6 (six) hours as needed for pain.  Marland Kitchen psyllium (METAMUCIL SMOOTH TEXTURE) 28 % packet Take 1 packet in an 8 oz glass of water twice a day as needed for constipation.  Do not take within 2 hours of taking other medications.    Allergies: No Known Allergies   Past Medical History  Diagnosis Date  . Obesity   . Cellulitis of left foot   . Dystrophic nail   . Abnormal electrocardiogram   . Postmenopausal status   . Peripheral edema   . Anxiety     Past Surgical History  Procedure Date  . Closed reduction tibial fracture 04/2011     Objective:    Physical Exam: Filed Vitals:   07/09/12 0953  BP: 156/82  Pulse: 102  Temp: 97.1 F (36.2 C)      General: Vital signs reviewed and noted. Well-developed, well-nourished, in no acute distress; alert, appropriate and cooperative throughout examination.  Head: Normocephalic, atraumatic.  Lungs:  Normal respiratory effort. Clear to auscultation BL without crackles or wheezes.  Heart: RRR. S1 and S2 normal without gallop, rubs. No murmur.  Abdomen:  BS normoactive. Soft, Nondistended, non-tender.  No masses or organomegaly.  Extremities: Vascular - bilateral dorsalis pedis pulses equal and strong 2/2. Nonpitting edema over dorsal foot on bilaterally, equal. Left foot - hyperesthesia with  mild touch over dorsal/ plantar foot and anterior, posterior distal lower leg. No lesions noted, no warmth, redness, or edema over anterior lower leg.    Assessment/ Plan:    Case and plan of care discussed with attending physician, Dr. Margarito Liner.

## 2012-07-09 NOTE — Assessment & Plan Note (Signed)
Pertinent Data: Vitals - 1 value per visit 07/09/2012 05/26/2012 04/12/2012  SYSTOLIC 156 140 409  DIASTOLIC 82 73[right arm sitting large cuff[ 78   Vitals - 1 value per visit 06/16/2011  SYSTOLIC 131  DIASTOLIC 72[left arm sitting large cuff[    Assessment: She does not carry a diagnosis of hypertension. However, blood pressures have been elevated on multiple occasions, however seem to frequently be when she is in pain. I did not start medication for her today, because she is in 7/10 pain. However, given the longitudinal nature of her elevated blood pressures, we will need to consider to start therapy if persistent in the future.  Plan:      Continue to monitor for now, however, will likely need to initiate therapy soon if continues to be persistent in the future.   If initiation of therapy is indicated, may consider a diuretic which may at least partially help with her lower extremity edema.

## 2012-07-09 NOTE — Assessment & Plan Note (Signed)
Health Maintenance  Topic Date Due  . Tetanus/tdap  11/22/1965  . Colonoscopy  11/22/1996  . Zostavax  11/23/2006  . Mammogram  07/12/2011  . Influenza Vaccine  03/14/2013  . Pneumococcal Polysaccharide Vaccine Age 65 And Over  Completed    Assessment:  Procedures due: Mammogram and colonoscopy - she has recently missed her appointment for her colonoscopy, she was counseled that if she reschedules, she will need to ensure that she follows up as appropriate, so that she can have her appropriate colon cancer screening.  Labs due: None  Immunizations due: Tetanus shot and Zostavax.  Plan:  RX given for Zostavax  Tdap deferred as medicare will not cover if no acute issue and patient has financial constraints

## 2012-07-09 NOTE — Patient Instructions (Addendum)
General Instructions:  Please follow-up at the clinic in 1-2 months with your PCP, at which time we will reevaluate your leg pain - OR, please follow-up in the clinic sooner if needed.  There have been changes in your medications:  START Gabapentin for your leg pain - take it at bedtime - You have been started on a new medication that can cause drowsiness, do not drive or operate heavy machinery . Do not take this medication with alcohol.    Go see the neurologist regarding your leg pain - they will consider nerve conduction studies.  Get your shingles shot - take the prescription to your pharmacy.  If you have been started on new medication(s), and you develop symptoms concerning for allergic reaction, including, but not limited to, throat closing, tongue swelling, rash, please stop the medication immediately and call the clinic at [phone], and go to the ER.  If you are diabetic, please bring your meter to your next visit.  If symptoms worsen, or new symptoms arise, please call the clinic or go to the ER.  PLEASE BRING ALL OF YOUR MEDICATIONS  IN A BAG TO YOUR NEXT APPOINTMENT

## 2012-07-25 ENCOUNTER — Emergency Department (HOSPITAL_COMMUNITY)
Admission: EM | Admit: 2012-07-25 | Discharge: 2012-07-25 | Disposition: A | Discharge status: Home / Self Care | Payer: No Typology Code available for payment source | Attending: Emergency Medicine | Admitting: Emergency Medicine

## 2012-07-25 ENCOUNTER — Emergency Department (HOSPITAL_COMMUNITY): Payer: No Typology Code available for payment source

## 2012-07-25 ENCOUNTER — Encounter (HOSPITAL_COMMUNITY): Payer: Self-pay | Admitting: Emergency Medicine

## 2012-07-25 DIAGNOSIS — S139XXA Sprain of joints and ligaments of unspecified parts of neck, initial encounter: Secondary | ICD-10-CM | POA: Diagnosis not present

## 2012-07-25 DIAGNOSIS — Z780 Asymptomatic menopausal state: Secondary | ICD-10-CM | POA: Insufficient documentation

## 2012-07-25 DIAGNOSIS — E7800 Pure hypercholesterolemia, unspecified: Secondary | ICD-10-CM | POA: Diagnosis not present

## 2012-07-25 DIAGNOSIS — Y9389 Activity, other specified: Secondary | ICD-10-CM | POA: Diagnosis not present

## 2012-07-25 DIAGNOSIS — Y92410 Unspecified street and highway as the place of occurrence of the external cause: Secondary | ICD-10-CM | POA: Diagnosis not present

## 2012-07-25 DIAGNOSIS — E669 Obesity, unspecified: Secondary | ICD-10-CM | POA: Insufficient documentation

## 2012-07-25 DIAGNOSIS — F411 Generalized anxiety disorder: Secondary | ICD-10-CM | POA: Diagnosis not present

## 2012-07-25 DIAGNOSIS — Z79899 Other long term (current) drug therapy: Secondary | ICD-10-CM | POA: Diagnosis not present

## 2012-07-25 DIAGNOSIS — S161XXA Strain of muscle, fascia and tendon at neck level, initial encounter: Secondary | ICD-10-CM

## 2012-07-25 DIAGNOSIS — Z872 Personal history of diseases of the skin and subcutaneous tissue: Secondary | ICD-10-CM | POA: Insufficient documentation

## 2012-07-25 DIAGNOSIS — Z9889 Other specified postprocedural states: Secondary | ICD-10-CM | POA: Insufficient documentation

## 2012-07-25 DIAGNOSIS — Z8739 Personal history of other diseases of the musculoskeletal system and connective tissue: Secondary | ICD-10-CM | POA: Diagnosis not present

## 2012-07-25 DIAGNOSIS — T07XXXA Unspecified multiple injuries, initial encounter: Secondary | ICD-10-CM

## 2012-07-25 HISTORY — DX: Pure hypercholesterolemia, unspecified: E78.00

## 2012-07-25 MED ORDER — LORAZEPAM 1 MG PO TABS
1.0000 mg | ORAL_TABLET | Freq: Once | ORAL | Status: AC
  Administered 2012-07-25: 1 mg via ORAL
  Filled 2012-07-25: qty 1

## 2012-07-25 MED ORDER — OXYCODONE-ACETAMINOPHEN 5-325 MG PO TABS
2.0000 | ORAL_TABLET | Freq: Once | ORAL | Status: AC
  Administered 2012-07-25: 2 via ORAL
  Filled 2012-07-25: qty 2

## 2012-07-25 MED ORDER — IBUPROFEN 400 MG PO TABS
600.0000 mg | ORAL_TABLET | Freq: Once | ORAL | Status: AC
  Administered 2012-07-25: 600 mg via ORAL
  Filled 2012-07-25: qty 2

## 2012-07-25 MED ORDER — OXYCODONE-ACETAMINOPHEN 5-325 MG PO TABS: 1.0000 | ORAL_TABLET | ORAL | Status: DC | PRN

## 2012-07-25 MED ORDER — CYCLOBENZAPRINE HCL 10 MG PO TABS: 10.0000 mg | ORAL_TABLET | Freq: Three times a day (TID) | ORAL | Status: DC | PRN
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
## 2012-07-25 NOTE — ED Notes (Signed)
C-collar removed by Dr. Juleen China.

## 2012-07-25 NOTE — ED Notes (Signed)
Remains in x-ray  

## 2012-08-04 ENCOUNTER — Ambulatory Visit: Payer: Medicare HMO | Admitting: Internal Medicine

## 2012-08-05 ENCOUNTER — Ambulatory Visit (INDEPENDENT_AMBULATORY_CARE_PROVIDER_SITE_OTHER): Payer: Self-pay | Admitting: Internal Medicine

## 2012-08-05 ENCOUNTER — Encounter: Payer: Self-pay | Admitting: Internal Medicine

## 2012-08-05 VITALS — BP 153/80 | HR 74 | Temp 97.9°F | Ht 66.0 in | Wt 226.4 lb

## 2012-08-05 DIAGNOSIS — R030 Elevated blood-pressure reading, without diagnosis of hypertension: Secondary | ICD-10-CM

## 2012-08-05 DIAGNOSIS — G8929 Other chronic pain: Secondary | ICD-10-CM

## 2012-08-05 MED ORDER — TRAMADOL HCL 50 MG PO TABS: 50.0000 mg | ORAL_TABLET | Freq: Four times a day (QID) | ORAL | Status: DC | PRN

## 2012-08-05 MED ORDER — OXYCODONE-ACETAMINOPHEN 5-325 MG PO TABS: 1.0000 | ORAL_TABLET | ORAL | Status: DC | PRN

## 2012-08-05 NOTE — Assessment & Plan Note (Signed)
The patient's blood pressure is 153/80. Currently patient is not on any medications for high blood pressure. Slight elevation of her blood pressure is likely due to pain. Will not start any new medications at this moment. We'll followup
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
## 2012-08-05 NOTE — Patient Instructions (Signed)
1. Please continue your pain medications. It is likely that you have soft tissue injury which may take one or more weeks to completely recover.  2. Please take all medications as prescribed.  3. If you have worsening of your symptoms or new symptoms arise, please call the clinic ([phone]), or go to the ER immediately if symptoms are severe.

## 2012-08-13 ENCOUNTER — Encounter: Payer: Self-pay | Admitting: *Deleted

## 2012-08-19 ENCOUNTER — Ambulatory Visit: Payer: Medicare HMO | Admitting: Internal Medicine

## 2012-09-09 ENCOUNTER — Ambulatory Visit (INDEPENDENT_AMBULATORY_CARE_PROVIDER_SITE_OTHER): Payer: Medicare Other | Admitting: Internal Medicine

## 2012-09-09 ENCOUNTER — Other Ambulatory Visit: Payer: Self-pay | Admitting: Internal Medicine

## 2012-09-09 ENCOUNTER — Encounter: Payer: Self-pay | Admitting: Gastroenterology

## 2012-09-09 ENCOUNTER — Encounter: Payer: Self-pay | Admitting: Internal Medicine

## 2012-09-09 VITALS — BP 153/87 | HR 77 | Temp 97.1°F | Ht 63.0 in | Wt 230.5 lb

## 2012-09-09 DIAGNOSIS — E785 Hyperlipidemia, unspecified: Secondary | ICD-10-CM

## 2012-09-09 DIAGNOSIS — I10 Essential (primary) hypertension: Secondary | ICD-10-CM

## 2012-09-09 DIAGNOSIS — M79609 Pain in unspecified limb: Secondary | ICD-10-CM

## 2012-09-09 DIAGNOSIS — Z1211 Encounter for screening for malignant neoplasm of colon: Secondary | ICD-10-CM

## 2012-09-09 DIAGNOSIS — Z139 Encounter for screening, unspecified: Secondary | ICD-10-CM

## 2012-09-09 DIAGNOSIS — F411 Generalized anxiety disorder: Secondary | ICD-10-CM

## 2012-09-09 DIAGNOSIS — F419 Anxiety disorder, unspecified: Secondary | ICD-10-CM

## 2012-09-09 DIAGNOSIS — G8929 Other chronic pain: Secondary | ICD-10-CM

## 2012-09-09 LAB — LIPID PANEL
HDL: 45 mg/dL (ref >39)
LDL Cholesterol: 149 mg/dL — ABNORMAL HIGH (ref 0–99)
Total CHOL/HDL Ratio: 4.7 Ratio
Triglycerides: 91 mg/dL (ref <150)
VLDL: 18 mg/dL (ref 0–40)

## 2012-09-09 LAB — COMPLETE METABOLIC PANEL WITH GFR
Albumin: 4 g/dL (ref 3.5–5.2)
Alkaline Phosphatase: 105 U/L (ref 39–117)
BUN: 11 mg/dL (ref 6–23)
Creat: 0.92 mg/dL (ref 0.50–1.10)
GFR, Est Non African American: 66 mL/min
Glucose, Bld: 82 mg/dL (ref 70–99)
Total Bilirubin: 0.3 mg/dL (ref 0.3–1.2)

## 2012-09-09 MED ORDER — HYDROCHLOROTHIAZIDE 12.5 MG PO TABS: 12.5000 mg | ORAL_TABLET | Freq: Every day | ORAL | Status: DC

## 2012-09-09 MED ORDER — CLONAZEPAM 0.5 MG PO TABS: 0.5000 mg | ORAL_TABLET | Freq: Two times a day (BID) | ORAL | Status: DC | PRN

## 2012-09-09 MED ORDER — HYDROCODONE-ACETAMINOPHEN 5-500 MG PO TABS: 1.0000 | ORAL_TABLET | Freq: Four times a day (QID) | ORAL | Status: DC | PRN
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
## 2012-09-09 NOTE — Patient Instructions (Addendum)
Referral made for: mammogram, colonoscopy, bone density screening. Lab work today. Start taking hydrochlorothiazide for blood pressure. Use klonopin for anxiety twice daily as needed. Use Vicodin as Rx for pain as needed, remember risks of this medication including fall and confusion when used alone or with klonopin. Try to space these out at least 12-18 hours. Return in 1 month.

## 2012-09-17 ENCOUNTER — Other Ambulatory Visit: Payer: Self-pay | Admitting: Internal Medicine

## 2012-09-22 ENCOUNTER — Ambulatory Visit (HOSPITAL_COMMUNITY): Payer: No Typology Code available for payment source

## 2012-09-22 ENCOUNTER — Ambulatory Visit (HOSPITAL_COMMUNITY): Payer: Medicare Other

## 2012-09-23 ENCOUNTER — Ambulatory Visit: Payer: Medicare HMO | Admitting: Internal Medicine

## 2012-10-05 ENCOUNTER — Ambulatory Visit (HOSPITAL_COMMUNITY): Payer: No Typology Code available for payment source

## 2012-10-05 ENCOUNTER — Ambulatory Visit (HOSPITAL_COMMUNITY): Payer: Medicare Other

## 2012-10-07 ENCOUNTER — Ambulatory Visit (INDEPENDENT_AMBULATORY_CARE_PROVIDER_SITE_OTHER): Payer: Medicare Other | Admitting: Internal Medicine

## 2012-10-07 ENCOUNTER — Encounter: Payer: Self-pay | Admitting: Internal Medicine

## 2012-10-07 VITALS — BP 145/84 | HR 106 | Temp 98.0°F | Wt 227.0 lb

## 2012-10-07 DIAGNOSIS — E669 Obesity, unspecified: Secondary | ICD-10-CM

## 2012-10-07 DIAGNOSIS — G8929 Other chronic pain: Secondary | ICD-10-CM

## 2012-10-07 DIAGNOSIS — R7309 Other abnormal glucose: Secondary | ICD-10-CM

## 2012-10-07 DIAGNOSIS — Z0000 Encounter for general adult medical examination without abnormal findings: Secondary | ICD-10-CM

## 2012-10-07 DIAGNOSIS — E785 Hyperlipidemia, unspecified: Secondary | ICD-10-CM

## 2012-10-07 DIAGNOSIS — I10 Essential (primary) hypertension: Secondary | ICD-10-CM

## 2012-10-07 DIAGNOSIS — M79609 Pain in unspecified limb: Secondary | ICD-10-CM

## 2012-10-07 DIAGNOSIS — F411 Generalized anxiety disorder: Secondary | ICD-10-CM

## 2012-10-07 DIAGNOSIS — Z8781 Personal history of (healed) traumatic fracture: Secondary | ICD-10-CM

## 2012-10-07 DIAGNOSIS — F419 Anxiety disorder, unspecified: Secondary | ICD-10-CM

## 2012-10-07 DIAGNOSIS — M25562 Pain in left knee: Secondary | ICD-10-CM

## 2012-10-07 LAB — LIPID PANEL
Cholesterol: 154 mg/dL (ref 0–200)
HDL: 42 mg/dL (ref >39)
Total CHOL/HDL Ratio: 3.7 Ratio

## 2012-10-07 MED ORDER — CLONAZEPAM 0.5 MG PO TABS: 1.0000 mg | ORAL_TABLET | Freq: Two times a day (BID) | ORAL | Status: DC | PRN

## 2012-10-07 MED ORDER — AMLODIPINE BESYLATE 2.5 MG PO TABS: 2.5000 mg | ORAL_TABLET | Freq: Every day | ORAL | Status: DC

## 2012-10-07 NOTE — Patient Instructions (Addendum)
Referral made to physical therapy for your knee. Lipid panel today. Referred you to gynecology per your request to have them to PAP, breast exam, and mammogram. Tdap (tetanus) vaccine today. Bone density test ordered. We will need records from your orthopedic doctor. Please return in 2 months.
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
## 2012-10-08 DIAGNOSIS — I10 Essential (primary) hypertension: Secondary | ICD-10-CM | POA: Insufficient documentation

## 2012-10-19 ENCOUNTER — Ambulatory Visit: Payer: No Typology Code available for payment source | Admitting: Physical Therapy

## 2012-10-20 ENCOUNTER — Ambulatory Visit (AMBULATORY_SURGERY_CENTER): Payer: Medicare Other | Admitting: *Deleted

## 2012-10-20 ENCOUNTER — Telehealth: Payer: Self-pay | Admitting: *Deleted

## 2012-10-20 VITALS — Ht 67.0 in | Wt 227.8 lb

## 2012-10-20 DIAGNOSIS — Z1211 Encounter for screening for malignant neoplasm of colon: Secondary | ICD-10-CM

## 2012-10-20 MED ORDER — MOVIPREP 100 G PO SOLR: ORAL | Status: DC

## 2012-10-20 NOTE — Telephone Encounter (Signed)
Dr Christella Hartigan: pt is scheduled for direct screening colonoscopy 4/23.  She is taking Elavil, Clonazepam, and takes Vicodin 3x daily for chronic pain.  She was very anxious during PV and is very anxious when she has any medical procedure.  Should she have colon with propofol or do you want her to have an office visit with you before scheduling colon?  Thanks, Olegario Messier

## 2012-10-21 ENCOUNTER — Telehealth: Payer: Self-pay | Admitting: *Deleted

## 2012-10-21 NOTE — Telephone Encounter (Signed)
Opened in error

## 2012-10-21 NOTE — Telephone Encounter (Signed)
Spoke with pt.  Appt changed to 11-12-12, arrive at 9:30 a.m. For a 10:30 a.m. Procedure.  Times reviewed to drink prep and understanding voiced

## 2012-10-21 NOTE — Telephone Encounter (Signed)
LEC, propofol without need for OV.  thanks

## 2012-10-22 ENCOUNTER — Encounter: Payer: Self-pay | Admitting: Gastroenterology

## 2012-11-01 ENCOUNTER — Ambulatory Visit: Payer: No Typology Code available for payment source | Attending: Internal Medicine | Admitting: Physical Therapy

## 2012-11-01 ENCOUNTER — Ambulatory Visit: Payer: No Typology Code available for payment source | Admitting: Physical Therapy

## 2012-11-01 DIAGNOSIS — M2560 Stiffness of unspecified joint, not elsewhere classified: Secondary | ICD-10-CM | POA: Insufficient documentation

## 2012-11-01 DIAGNOSIS — M25559 Pain in unspecified hip: Secondary | ICD-10-CM | POA: Insufficient documentation

## 2012-11-01 DIAGNOSIS — IMO0001 Reserved for inherently not codable concepts without codable children: Secondary | ICD-10-CM | POA: Insufficient documentation

## 2012-11-01 DIAGNOSIS — M2550 Pain in unspecified joint: Secondary | ICD-10-CM | POA: Insufficient documentation

## 2012-11-03 ENCOUNTER — Other Ambulatory Visit: Payer: Self-pay | Admitting: Gastroenterology

## 2012-11-04 ENCOUNTER — Ambulatory Visit: Payer: No Typology Code available for payment source | Admitting: Physical Therapy

## 2012-11-08 ENCOUNTER — Ambulatory Visit: Payer: No Typology Code available for payment source | Admitting: Physical Therapy

## 2012-11-10 ENCOUNTER — Ambulatory Visit: Payer: No Typology Code available for payment source | Admitting: Physical Therapy

## 2012-11-11 ENCOUNTER — Telehealth: Payer: Self-pay | Admitting: Gastroenterology

## 2012-11-11 DIAGNOSIS — Z1211 Encounter for screening for malignant neoplasm of colon: Secondary | ICD-10-CM

## 2012-11-11 MED ORDER — MOVIPREP 100 G PO SOLR: ORAL | Status: DC

## 2012-11-11 NOTE — Telephone Encounter (Signed)
Returned pts call and answered her question on how to mix prep.  She verbalized understanding.

## 2012-11-11 NOTE — Telephone Encounter (Signed)
Called patient and advised her that she will have to pick a pharmacy that does not have her insurance information and use coupon as cash.  [facility] [address]  I will resend Rx. To [facility].  Patient continued to ask what medications she is supposed to hold.  She also continued asking about her instructions and I went over them to her again in detail.  The times on her paperwork in the computer was incorrect so I explained to her what time she is to arrive.  All questions were answered and she will go pick up her prep at [address].

## 2012-11-12 ENCOUNTER — Encounter: Payer: Self-pay | Admitting: Gastroenterology

## 2012-11-12 ENCOUNTER — Ambulatory Visit (AMBULATORY_SURGERY_CENTER): Payer: Medicare Other | Admitting: Gastroenterology

## 2012-11-12 VITALS — BP 133/76 | HR 63 | Temp 97.8°F | Resp 24 | Ht 67.0 in | Wt 227.0 lb

## 2012-11-12 DIAGNOSIS — Z1211 Encounter for screening for malignant neoplasm of colon: Secondary | ICD-10-CM

## 2012-11-12 MED ORDER — SODIUM CHLORIDE 0.9 % IV SOLN: 500.0000 mL | INTRAVENOUS | Status: DC

## 2012-11-12 NOTE — Op Note (Signed)
[facility] [address] [place], [address]   COLONOSCOPY PROCEDURE REPORT  PATIENT: Sarah [name], Person  MR#: [id number] BIRTHDATE: 21-Jun-1946 , 66  yrs. old GENDER: Female ENDOSCOPIST: Rachael Fee, MD REFERRED BY:  Jonah Blue, MD PROCEDURE DATE:  11/12/2012 PROCEDURE:   Colonoscopy, screening ASA CLASS:   Class II INDICATIONS:average risk screening. MEDICATIONS: propofol (Diprivan) 150mg  IV and MAC sedation, administered by CRNA  DESCRIPTION OF PROCEDURE:   After the risks benefits and alternatives of the procedure were thoroughly explained, informed consent was obtained.  A digital rectal exam revealed no abnormalities of the rectum.   The LB [id number]-[id number]  endoscope was introduced through the anus and advanced to the cecum, which was identified by both the appendix and ileocecal valve. No adverse events experienced.   The quality of the prep was good.  The instrument was then slowly withdrawn as the colon was fully examined.   COLON FINDINGS: A normal appearing cecum, ileocecal valve, and appendiceal orifice were identified.  The ascending, hepatic flexure, transverse, splenic flexure, descending, sigmoid colon and rectum appeared unremarkable.  No polyps or cancers were seen. Retroflexed views revealed no abnormalities. The time to cecum=3 minutes 38 seconds.  Withdrawal time=7 minutes 10 seconds.  The scope was withdrawn and the procedure completed. COMPLICATIONS: There were no complications.  ENDOSCOPIC IMPRESSION: Normal colon No polyps or cancers  RECOMMENDATIONS: You should continue to follow colorectal cancer screening guidelines for "routine risk" patients with a repeat colonoscopy in 10 years.    eSigned:  Rachael Fee, MD 11/12/2012 10:25 AM
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
## 2012-11-12 NOTE — Progress Notes (Signed)
Lidocaine-40mg IV prior to Propofol InductionPropofol given over incremental dosages 

## 2012-11-12 NOTE — Patient Instructions (Addendum)
YOU HAD AN ENDOSCOPIC PROCEDURE TODAY AT [facility]: Refer to the procedure report that was given to you for any specific questions about what was found during the examination.  If the procedure report does not answer your questions, please call your gastroenterologist to clarify.  If you requested that your care partner not be given the details of your procedure findings, then the procedure report has been included in a sealed envelope for you to review at your convenience later.  YOU SHOULD EXPECT: Some feelings of bloating in the abdomen. Passage of more gas than usual.  Walking can help get rid of the air that was put into your GI tract during the procedure and reduce the bloating. If you had a lower endoscopy (such as a colonoscopy or flexible sigmoidoscopy) you may notice spotting of blood in your stool or on the toilet paper. If you underwent a bowel prep for your procedure, then you may not have a normal bowel movement for a few days.  DIET: Your first meal following the procedure should be a light meal and then it is ok to progress to your normal diet.  A half-sandwich or bowl of soup is an example of a good first meal.  Heavy or fried foods are harder to digest and may make you feel nauseous or bloated.  Likewise meals heavy in dairy and vegetables can cause extra gas to form and this can also increase the bloating.  Drink plenty of fluids but you should avoid alcoholic beverages for 24 hours.  ACTIVITY: Your care partner should take you home directly after the procedure.  You should plan to take it easy, moving slowly for the rest of the day.  You can resume normal activity the day after the procedure however you should NOT DRIVE or use heavy machinery for 24 hours (because of the sedation medicines used during the test).    SYMPTOMS TO REPORT IMMEDIATELY: A gastroenterologist can be reached at any hour.  During normal business hours, 8:30 AM to 5:00 PM Monday through Friday,  call [phone].  After hours and on weekends, please call the GI answering service at [phone] who will take a message and have the physician on call contact you.   Following lower endoscopy (colonoscopy or flexible sigmoidoscopy):  Excessive amounts of blood in the stool  Significant tenderness or worsening of abdominal pains  Swelling of the abdomen that is new, acute  Fever of 100F or higher s  FOLLOW UP: If any biopsies were taken you will be contacted by phone or by letter within the next 1-3 weeks.  Call your gastroenterologist if you have not heard about the biopsies in 3 weeks.  Our staff will call the home number listed on your records the next business day following your procedure to check on you and address any questions or concerns that you may have at that time regarding the information given to you following your procedure. This is a courtesy call and so if there is no answer at the home number and we have not heard from you through the emergency physician on call, we will assume that you have returned to your regular daily activities without incident.  SIGNATURES/CONFIDENTIALITY: You and/or your care partner have signed paperwork which will be entered into your electronic medical record.  These signatures attest to the fact that that the information above on your After Visit Summary has been reviewed and is understood.  Full responsibility of the confidentiality of  this discharge information lies with you and/or your care-partner.  Normal colonoscopy.  Repeat in 10 years-2024

## 2012-11-15 ENCOUNTER — Telehealth: Payer: Self-pay | Admitting: *Deleted

## 2012-11-15 NOTE — Telephone Encounter (Signed)
  Follow up Call-  Call back number 11/12/2012  Post procedure Call Back phone  # [phone]  Permission to leave phone message No     Patient questions:  Left message to call us if necessary.

## 2012-11-17 ENCOUNTER — Ambulatory Visit: Payer: Medicare Other

## 2012-11-18 ENCOUNTER — Other Ambulatory Visit: Payer: Self-pay

## 2012-11-18 LAB — HM MAMMOGRAPHY

## 2012-11-23 ENCOUNTER — Ambulatory Visit: Payer: Medicare Other | Attending: Internal Medicine

## 2012-11-23 DIAGNOSIS — M25559 Pain in unspecified hip: Secondary | ICD-10-CM | POA: Insufficient documentation

## 2012-11-23 DIAGNOSIS — IMO0001 Reserved for inherently not codable concepts without codable children: Secondary | ICD-10-CM | POA: Insufficient documentation

## 2012-11-23 DIAGNOSIS — M2550 Pain in unspecified joint: Secondary | ICD-10-CM | POA: Insufficient documentation

## 2012-11-23 DIAGNOSIS — M2560 Stiffness of unspecified joint, not elsewhere classified: Secondary | ICD-10-CM | POA: Insufficient documentation

## 2012-12-01 ENCOUNTER — Ambulatory Visit: Payer: Medicare Other

## 2012-12-02 ENCOUNTER — Ambulatory Visit (INDEPENDENT_AMBULATORY_CARE_PROVIDER_SITE_OTHER): Payer: Medicare Other | Admitting: Internal Medicine

## 2012-12-02 ENCOUNTER — Encounter: Payer: Self-pay | Admitting: Internal Medicine

## 2012-12-02 VITALS — BP 140/74 | HR 92 | Temp 99.3°F | Ht 67.0 in | Wt 226.8 lb

## 2012-12-02 DIAGNOSIS — R030 Elevated blood-pressure reading, without diagnosis of hypertension: Secondary | ICD-10-CM

## 2012-12-02 DIAGNOSIS — S8290XS Unspecified fracture of unspecified lower leg, sequela: Secondary | ICD-10-CM

## 2012-12-02 DIAGNOSIS — M79609 Pain in unspecified limb: Secondary | ICD-10-CM

## 2012-12-02 DIAGNOSIS — F411 Generalized anxiety disorder: Secondary | ICD-10-CM

## 2012-12-02 DIAGNOSIS — I10 Essential (primary) hypertension: Secondary | ICD-10-CM

## 2012-12-02 DIAGNOSIS — G8929 Other chronic pain: Secondary | ICD-10-CM

## 2012-12-02 DIAGNOSIS — F419 Anxiety disorder, unspecified: Secondary | ICD-10-CM

## 2012-12-02 DIAGNOSIS — B353 Tinea pedis: Secondary | ICD-10-CM

## 2012-12-02 DIAGNOSIS — S82142S Displaced bicondylar fracture of left tibia, sequela: Secondary | ICD-10-CM

## 2012-12-02 MED ORDER — CLOTRIMAZOLE-BETAMETHASONE 1-0.05 % EX CREA: TOPICAL_CREAM | CUTANEOUS | Status: DC

## 2012-12-02 MED ORDER — HYDROCODONE-ACETAMINOPHEN 5-500 MG PO TABS: 1.0000 | ORAL_TABLET | Freq: Four times a day (QID) | ORAL | Status: DC | PRN

## 2012-12-02 MED ORDER — GABAPENTIN 100 MG PO CAPS: 100.0000 mg | ORAL_CAPSULE | Freq: Three times a day (TID) | ORAL | Status: DC

## 2012-12-02 MED ORDER — CALCIUM 600+D PLUS MINERALS 600-400 MG-UNIT PO CHEW: 1.0000 | CHEWABLE_TABLET | Freq: Every day | ORAL | Status: DC

## 2012-12-02 MED ORDER — AMLODIPINE BESYLATE 2.5 MG PO TABS: 2.5000 mg | ORAL_TABLET | Freq: Every day | ORAL | Status: DC

## 2012-12-02 MED ORDER — AMITRIPTYLINE HCL 25 MG PO TABS: 50.0000 mg | ORAL_TABLET | Freq: Every day | ORAL | Status: DC

## 2012-12-02 MED ORDER — HYDROCHLOROTHIAZIDE 12.5 MG PO TABS: 12.5000 mg | ORAL_TABLET | Freq: Every day | ORAL | Status: DC

## 2012-12-02 MED ORDER — CLONAZEPAM 0.5 MG PO TABS: 1.0000 mg | ORAL_TABLET | Freq: Two times a day (BID) | ORAL | Status: DC | PRN

## 2012-12-02 MED ORDER — ATORVASTATIN CALCIUM 10 MG PO TABS: ORAL_TABLET | ORAL | Status: DC

## 2012-12-02 NOTE — Patient Instructions (Addendum)
Increase gabapentin to 100 mg three times daily. All Rx sent to [facility] as you requested. I have referred you to physical medicine and rehab. Continue going to physical therapy. I will need records from your gynecologist and orthopedic doctor. Return in 3 months.
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
## 2012-12-03 ENCOUNTER — Ambulatory Visit: Payer: Medicare Other

## 2012-12-07 ENCOUNTER — Telehealth: Payer: Self-pay | Admitting: *Deleted

## 2012-12-07 NOTE — Telephone Encounter (Signed)
Received PA request from pt's pharmacy for her amitriptyline 25 mg tabs take 2 tabs at bedtime #60.  Contacted [facility] at [phone] to initiate PA, medication was approved for one year.Sarah Spittle Cassady5/27/20149:49 AM

## 2012-12-08 ENCOUNTER — Ambulatory Visit: Payer: Medicare Other

## 2012-12-14 ENCOUNTER — Ambulatory Visit: Payer: Medicare Other | Attending: Internal Medicine | Admitting: Rehabilitation

## 2012-12-14 DIAGNOSIS — M2560 Stiffness of unspecified joint, not elsewhere classified: Secondary | ICD-10-CM | POA: Insufficient documentation

## 2012-12-14 DIAGNOSIS — M2550 Pain in unspecified joint: Secondary | ICD-10-CM | POA: Insufficient documentation

## 2012-12-14 DIAGNOSIS — IMO0001 Reserved for inherently not codable concepts without codable children: Secondary | ICD-10-CM | POA: Insufficient documentation

## 2012-12-14 DIAGNOSIS — M25559 Pain in unspecified hip: Secondary | ICD-10-CM | POA: Insufficient documentation

## 2012-12-16 ENCOUNTER — Encounter: Payer: Medicare Other | Admitting: Physical Therapy

## 2013-01-05 NOTE — Addendum Note (Signed)
Addended by: Neomia Dear on: 01/05/2013 07:38 AM   Modules accepted: Orders

## 2013-01-07 ENCOUNTER — Other Ambulatory Visit: Payer: Self-pay | Admitting: *Deleted

## 2013-01-07 DIAGNOSIS — G8929 Other chronic pain: Secondary | ICD-10-CM

## 2013-01-07 MED ORDER — GABAPENTIN 100 MG PO CAPS: 100.0000 mg | ORAL_CAPSULE | Freq: Three times a day (TID) | ORAL | Status: DC

## 2013-01-24 ENCOUNTER — Telehealth: Payer: Self-pay | Admitting: *Deleted
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
## 2013-01-26 ENCOUNTER — Ambulatory Visit (INDEPENDENT_AMBULATORY_CARE_PROVIDER_SITE_OTHER): Payer: Medicare Other | Admitting: Internal Medicine

## 2013-01-26 ENCOUNTER — Encounter: Payer: Self-pay | Admitting: Internal Medicine

## 2013-01-26 VITALS — BP 137/78 | HR 86 | Temp 97.6°F | Ht 63.75 in | Wt 232.4 lb

## 2013-01-26 DIAGNOSIS — Z13820 Encounter for screening for osteoporosis: Secondary | ICD-10-CM

## 2013-01-26 DIAGNOSIS — Z0000 Encounter for general adult medical examination without abnormal findings: Secondary | ICD-10-CM

## 2013-01-26 DIAGNOSIS — I10 Essential (primary) hypertension: Secondary | ICD-10-CM

## 2013-01-26 DIAGNOSIS — G8929 Other chronic pain: Secondary | ICD-10-CM

## 2013-01-26 DIAGNOSIS — M79609 Pain in unspecified limb: Secondary | ICD-10-CM

## 2013-01-26 DIAGNOSIS — Z1239 Encounter for other screening for malignant neoplasm of breast: Secondary | ICD-10-CM

## 2013-01-26 MED ORDER — HYDROCODONE-ACETAMINOPHEN 5-325 MG PO TABS: 1.0000 | ORAL_TABLET | Freq: Four times a day (QID) | ORAL | Status: DC | PRN

## 2013-01-26 NOTE — Patient Instructions (Signed)
General Instructions: Please get your dexa scan done  We have prescribed your Vicodin again today for 1 month and follow up with your PCP  Continue to walk with a cane  You have been referred to Pain management and rehabilitation in the past but have been unable to schedule an appointment or see them, please discuss with your pcp on options.  I think physical therapy will help you but you need to follow up with them appropriately.    Treatment Goals:  Goals (1 Years of Data) as of 01/26/13   None      Progress Toward Treatment Goals:  Treatment Goal 01/26/2013  Blood pressure at goal    Self Care Goals & Plans:  Self Care Goal 01/26/2013  Manage my medications take my medicines as prescribed; bring my medications to every visit; refill my medications on time; follow the sick day instructions if I am sick  Monitor my health keep track of my weight; keep track of my blood pressure  Eat healthy foods eat more vegetables; eat fruit for snacks and desserts; eat baked foods instead of fried foods  Be physically active find an activity I enjoy    Care Management & Community Referrals:    Acetaminophen; Hydrocodone tablets or capsules What is this medicine? ACETAMINOPHEN; HYDROCODONE (a set a MEE noe fen; hye droe KOE done) is a pain reliever. It is used to treat mild to moderate pain. This medicine may be used for other purposes; ask your health care provider or pharmacist if you have questions. What should I tell my health care provider before I take this medicine? They need to know if you have any of these conditions: -brain tumor -Crohn's disease, inflammatory bowel disease, or ulcerative colitis -drink more than 3 alcohol-containing drinks per day -drug abuse or addiction -head injury -heart or circulation problems -kidney disease or problems going to the bathroom -liver disease -lung disease, asthma, or breathing problems -an unusual or allergic reaction to acetaminophen,  hydrocodone, other opioid analgesics, other medicines, foods, dyes, or preservatives -pregnant or trying to get pregnant -breast-feeding How should I use this medicine? Take this medicine by mouth. Swallow it with a full glass of water. Follow the directions on the prescription label. If the medicine upsets your stomach, take the medicine with food or milk. Do not take more than you are told to take. Talk to your pediatrician regarding the use of this medicine in children. This medicine is not approved for use in children. Overdosage: If you think you have taken too much of this medicine contact a poison control center or emergency room at once. NOTE: This medicine is only for you. Do not share this medicine with others. What if I miss a dose? If you miss a dose, take it as soon as you can. If it is almost time for your next dose, take only that dose. Do not take double or extra doses. What may interact with this medicine? -alcohol -antihistamines -isoniazid -medicines for depression, anxiety, or psychotic disturbances -medicines for sleep -muscle relaxants -naltrexone -narcotic medicines (opiates) for pain -phenobarbital -ritonavir -tramadol This list may not describe all possible interactions. Give your health care provider a list of all the medicines, herbs, non-prescription drugs, or dietary supplements you use. Also tell them if you smoke, drink alcohol, or use illegal drugs. Some items may interact with your medicine. What should I watch for while using this medicine? Tell your doctor or health care professional if your pain does not go  away, if it gets worse, or if you have new or a different type of pain. You may develop tolerance to the medicine. Tolerance means that you will need a higher dose of the medicine for pain relief. Tolerance is normal and is expected if you take the medicine for a long time. Do not suddenly stop taking your medicine because you may develop a severe  reaction. Your body becomes used to the medicine. This does NOT mean you are addicted. Addiction is a behavior related to getting and using a drug for a non-medical reason. If you have pain, you have a medical reason to take pain medicine. Your doctor will tell you how much medicine to take. If your doctor wants you to stop the medicine, the dose will be slowly lowered over time to avoid any side effects. You may get drowsy or dizzy when you first start taking the medicine or change doses. Do not drive, use machinery, or do anything that may be dangerous until you know how the medicine affects you. Stand or sit up slowly. There are different types of narcotic medicines (opiates) for pain. If you take more than one type at the same time, you may have more side effects. Give your health care provider a list of all medicines you use. Your doctor will tell you how much medicine to take. Do not take more medicine than directed. Call emergency for help if you have problems breathing. The medicine will cause constipation. Try to have a bowel movement at least every 2 to 3 days. If you do not have a bowel movement for 3 days, call your doctor or health care professional. Too much acetaminophen can be very dangerous. Do not take Tylenol (acetaminophen) or medicines that contain acetaminophen with this medicine. Many non-prescription medicines contain acetaminophen. Always read the labels carefully. What side effects may I notice from receiving this medicine? Side effects that you should report to your doctor or health care professional as soon as possible: -allergic reactions like skin rash, itching or hives, swelling of the face, lips, or tongue -breathing problems -confusion -feeling faint or lightheaded, falls -stomach pain -yellowing of the eyes or skin Side effects that usually do not require medical attention (report to your doctor or health care professional if they continue or are bothersome): -nausea,  vomiting -stomach upset This list may not describe all possible side effects. Call your doctor for medical advice about side effects. You may report side effects to FDA at 1-800-FDA-1088. Where should I keep my medicine? Keep out of the reach of children. This medicine can be abused. Keep your medicine in a safe place to protect it from theft. Do not share this medicine with anyone. Selling or giving away this medicine is dangerous and against the law. Store at room temperature between 15 and 30 degrees C (59 and 86 degrees F). Protect from light. Keep container tightly closed.  Throw away any unused medicine after the expiration date. Discard unused medicine and used packaging carefully. Pets and children can be harmed if they find used or lost packages. NOTE: This sheet is a summary. It may not cover all possible information. If you have questions about this medicine, talk to your doctor, pharmacist, or health care provider.  2013, [employer]. (03/10/2011 1:04:52 PM)

## 2013-01-26 NOTE — Assessment & Plan Note (Signed)
BP Readings from Last 3 Encounters:  01/26/13 137/78  12/02/12 140/74  11/12/12 133/76   Lab Results  Component Value Date   NA 140 09/09/2012   K 4.6 09/09/2012   CREATININE 0.92 09/09/2012    Assessment: Blood pressure control: controlled Progress toward BP goal:  at goal  Plan: Medications:  continue current medications norvasc 2.5mg , hctz 12.5mg  qd Educational resources provided: brochure;handout;video
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
## 2013-01-26 NOTE — Assessment & Plan Note (Signed)
Claims to have had mammogram recently with her GYN office that may not be on epic. Asked to bring or have records faxed.   -dexa scan today

## 2013-01-26 NOTE — Assessment & Plan Note (Signed)
Re-wrote for vicodin with 5-325mg  q6h prn x1 month.    -trial of vicodin per last pcp visit -dexa scan -consider PT vs. PM&R (referred twice to PM&R and did not schedule) -if pain and swelling continues, consider repeat imaging with xrays and consider lower extremity dopplers for possible dvt

## 2013-02-16 ENCOUNTER — Ambulatory Visit (INDEPENDENT_AMBULATORY_CARE_PROVIDER_SITE_OTHER): Payer: Medicare Other | Admitting: Internal Medicine

## 2013-02-16 ENCOUNTER — Encounter: Payer: Self-pay | Admitting: Internal Medicine

## 2013-02-16 VITALS — BP 142/78 | HR 80 | Temp 97.2°F | Ht 67.0 in | Wt 234.7 lb

## 2013-02-16 DIAGNOSIS — M25476 Effusion, unspecified foot: Secondary | ICD-10-CM

## 2013-02-16 DIAGNOSIS — I10 Essential (primary) hypertension: Secondary | ICD-10-CM

## 2013-02-16 DIAGNOSIS — M549 Dorsalgia, unspecified: Secondary | ICD-10-CM

## 2013-02-16 DIAGNOSIS — M25472 Effusion, left ankle: Secondary | ICD-10-CM

## 2013-02-16 DIAGNOSIS — G8929 Other chronic pain: Secondary | ICD-10-CM

## 2013-02-16 DIAGNOSIS — Z0000 Encounter for general adult medical examination without abnormal findings: Secondary | ICD-10-CM

## 2013-02-16 DIAGNOSIS — M25473 Effusion, unspecified ankle: Secondary | ICD-10-CM

## 2013-02-16 LAB — D-DIMER, QUANTITATIVE: D-Dimer, Quant: 0.26 ug/mL-FEU (ref 0.00–0.48)
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
## 2013-02-16 NOTE — Assessment & Plan Note (Addendum)
Assessment: Pt states Norco gives her temporary relief from her pain.  She just got a refill on this medication yesterday due to a possible miscommunication between the patient and her pharmacy; she is not asking for more today.  Need to be sure we are not missing etiology of her pain, therefore would like to rule out DVT and herniated disc.  Will hold off on re-starting physical therapy as sounds like pt was not progressing toward her goals.  Pt needs to be seen in pain clinic, emphasized the importance of this appointment to her.  Plan: -no narcotic rx today; clearly explained to pt that one additional refill of Norco would be prescribed by our clinic up until September, but that she needed to show up to her pain clinic appointment and allow them to manage her chronic pain -D-dimer to r/o DVT; if positive, will order left lower extremity doppler -noncon MRI of lumbar spine to r/o herniated disc; radiology will call pt with appt time -PM&R referral- will call pt with appt time

## 2013-02-16 NOTE — Assessment & Plan Note (Signed)
BP Readings from Last 3 Encounters:  02/16/13 142/78  01/26/13 137/78  12/02/12 140/74    Lab Results  Component Value Date   NA 140 09/09/2012   K 4.6 09/09/2012   CREATININE 0.92 09/09/2012    Assessment: Blood pressure control: controlled Progress toward BP goal:  at goal  Plan: Medications:  continue current medications (amlodipine, HCTZ)

## 2013-02-16 NOTE — Patient Instructions (Addendum)
It is very important for you to see the Physical Medicine and Rehabilitation doctor, they will call you to make this appointment!! We can only refill your pain medicine one additional time in August so please be sure you have an appointment by September.  You will also have an MRI of your back scheduled in the next couple of days.   Keep up the good work on your blood pressure!  Chronic Back Pain  When back pain lasts longer than 3 months, it is called chronic back pain.People with chronic back pain often go through certain periods that are more intense (flare-ups).  CAUSES Chronic back pain can be caused by wear and tear (degeneration) on different structures in your back. These structures include:  The bones of your spine (vertebrae) and the joints surrounding your spinal cord and nerve roots (facets).  The strong, fibrous tissues that connect your vertebrae (ligaments). Degeneration of these structures may result in pressure on your nerves. This can lead to constant pain. HOME CARE INSTRUCTIONS  Avoid bending, heavy lifting, prolonged sitting, and activities which make the problem worse.  Take brief periods of rest throughout the day to reduce your pain. Lying down or standing usually is better than sitting while you are resting.  Take over-the-counter or prescription medicines only as directed by your caregiver. SEEK IMMEDIATE MEDICAL CARE IF:   You have weakness or numbness in one of your legs or feet.  You have trouble controlling your bladder or bowels.  You have nausea, vomiting, abdominal pain, shortness of breath, or fainting. Document Released: 08/07/2004 Document Revised: 09/22/2011 Document Reviewed: 06/14/2011 [facility] Patient Information 2014 [place], [place].

## 2013-02-16 NOTE — Assessment & Plan Note (Signed)
Patient states she has had recent Pap smear, colonoscopy, and mammogram. She will try to bring her mammogram records in this afternoon to be scanned into her record.   Patient still needs to have a dexa scan.  Perhaps can get this scheduled at next appointment (did not do so today since we were already ordering bloodwork, MRI, PM&R referral).

## 2013-02-17 ENCOUNTER — Encounter: Payer: Self-pay | Admitting: Physical Medicine & Rehabilitation

## 2013-02-17 NOTE — Progress Notes (Signed)
I saw and evaluated the patient.  I personally confirmed the key portions of the history and exam documented by Dr. Rogers and I reviewed pertinent patient test results.  The assessment, diagnosis, and plan were formulated together and I agree with the documentation in the resident's note. 

## 2013-02-24 ENCOUNTER — Other Ambulatory Visit: Payer: Self-pay | Admitting: Internal Medicine

## 2013-02-24 MED ORDER — DIAZEPAM 5 MG PO TABS: 10.0000 mg | ORAL_TABLET | Freq: Once | ORAL | Status: DC

## 2013-03-01 ENCOUNTER — Other Ambulatory Visit (HOSPITAL_COMMUNITY): Payer: Medicare Other

## 2013-03-01 ENCOUNTER — Ambulatory Visit (HOSPITAL_COMMUNITY): Admission: RE | Admit: 2013-03-01 | Payer: Medicare Other | Source: Ambulatory Visit

## 2013-03-02 ENCOUNTER — Ambulatory Visit (HOSPITAL_COMMUNITY)
Admission: RE | Admit: 2013-03-02 | Discharge: 2013-03-02 | Disposition: A | Discharge status: Home / Self Care | Payer: Medicare Other | Source: Ambulatory Visit | Attending: Internal Medicine | Admitting: Internal Medicine

## 2013-03-02 DIAGNOSIS — G8929 Other chronic pain: Secondary | ICD-10-CM | POA: Insufficient documentation

## 2013-03-02 DIAGNOSIS — M5126 Other intervertebral disc displacement, lumbar region: Secondary | ICD-10-CM | POA: Insufficient documentation

## 2013-03-08 ENCOUNTER — Telehealth: Payer: Self-pay | Admitting: *Deleted

## 2013-03-08 NOTE — Telephone Encounter (Signed)
Pt called asking for the results of MRI of lumbar spine.  She was seen in your clinic on 8/6. I don't see the results in EPIC. Please call pt with the results. Pt # [id number]

## 2013-03-10 ENCOUNTER — Telehealth: Payer: Self-pay | Admitting: Internal Medicine

## 2013-03-10 NOTE — Telephone Encounter (Signed)
Called Ms. Riechers several times yesterday, voicemail box was full at that time.  Called back today, was able to leave message regarding her MRI results (per pt's request).  Explained degenerative changes, possible irritated nerve on right side, importance of pt following up with pain clinic for evaluation and possible steroid injection.  Instructed pt to call if she has worsening of her pain or neurologic symptoms including weakness, difficulty walking, bowel/bladder incontinence.

## 2013-03-18 NOTE — Telephone Encounter (Signed)
Pt called by MD

## 2013-03-25 ENCOUNTER — Encounter: Payer: Medicare Other | Attending: Physical Medicine & Rehabilitation

## 2013-03-25 ENCOUNTER — Ambulatory Visit: Payer: Medicare Other | Admitting: Physical Medicine & Rehabilitation

## 2013-03-25 DIAGNOSIS — M25559 Pain in unspecified hip: Secondary | ICD-10-CM | POA: Insufficient documentation

## 2013-03-25 DIAGNOSIS — M545 Low back pain, unspecified: Secondary | ICD-10-CM | POA: Insufficient documentation

## 2013-03-25 DIAGNOSIS — M47817 Spondylosis without myelopathy or radiculopathy, lumbosacral region: Secondary | ICD-10-CM | POA: Insufficient documentation

## 2013-04-01 ENCOUNTER — Encounter: Payer: Self-pay | Admitting: Physical Medicine & Rehabilitation

## 2013-04-01 ENCOUNTER — Ambulatory Visit (HOSPITAL_BASED_OUTPATIENT_CLINIC_OR_DEPARTMENT_OTHER): Payer: Medicare Other | Admitting: Physical Medicine & Rehabilitation

## 2013-04-01 VITALS — BP 144/53 | HR 87 | Resp 14 | Ht 67.0 in | Wt 235.6 lb

## 2013-04-01 DIAGNOSIS — Z79899 Other long term (current) drug therapy: Secondary | ICD-10-CM

## 2013-04-01 DIAGNOSIS — M549 Dorsalgia, unspecified: Secondary | ICD-10-CM

## 2013-04-01 DIAGNOSIS — Z5181 Encounter for therapeutic drug level monitoring: Secondary | ICD-10-CM

## 2013-04-01 DIAGNOSIS — G8929 Other chronic pain: Secondary | ICD-10-CM
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
## 2013-04-01 NOTE — Patient Instructions (Signed)
I don't see any pinch nerves in the back that would explain your left leg or left hip pain.  You did have some signs of bursitis in the left hip. This may respond to a hip bursa injection. If you decide you want this done please, please call to schedule.  There is arthritis in the lumbar spine worse on the left side. This can sometimes radiated down toward the buttocks were hip area. There is a spine injection called medial branch blocks. I would recommend L2 L3-L4 levels to further evaluate.  I do not see any reason to be on long term pain medication such as hydrocodone based on the testing that has been done. I do not recommend any further x-rays

## 2013-04-16 ENCOUNTER — Other Ambulatory Visit: Payer: Self-pay | Admitting: Internal Medicine

## 2013-04-18 NOTE — Telephone Encounter (Signed)
Called to pharm 

## 2013-04-19 ENCOUNTER — Other Ambulatory Visit: Payer: Self-pay | Admitting: *Deleted

## 2013-04-20 ENCOUNTER — Encounter: Payer: Self-pay | Admitting: Internal Medicine

## 2013-04-20 ENCOUNTER — Ambulatory Visit (INDEPENDENT_AMBULATORY_CARE_PROVIDER_SITE_OTHER): Payer: Medicare Other | Admitting: Internal Medicine

## 2013-04-20 VITALS — BP 122/78 | HR 91 | Temp 98.1°F | Ht 67.0 in | Wt 234.6 lb

## 2013-04-20 DIAGNOSIS — R030 Elevated blood-pressure reading, without diagnosis of hypertension: Secondary | ICD-10-CM

## 2013-04-20 DIAGNOSIS — I10 Essential (primary) hypertension: Secondary | ICD-10-CM

## 2013-04-20 DIAGNOSIS — G8929 Other chronic pain: Secondary | ICD-10-CM

## 2013-04-20 DIAGNOSIS — E785 Hyperlipidemia, unspecified: Secondary | ICD-10-CM

## 2013-04-20 MED ORDER — HYDROCODONE-ACETAMINOPHEN 5-325 MG PO TABS: 1.0000 | ORAL_TABLET | Freq: Four times a day (QID) | ORAL | Status: DC | PRN

## 2013-04-20 MED ORDER — ATORVASTATIN CALCIUM 10 MG PO TABS: ORAL_TABLET | ORAL | Status: DC

## 2013-04-20 MED ORDER — GABAPENTIN 100 MG PO CAPS: 100.0000 mg | ORAL_CAPSULE | Freq: Three times a day (TID) | ORAL | Status: DC

## 2013-04-20 MED ORDER — HYDROCHLOROTHIAZIDE 12.5 MG PO TABS: 12.5000 mg | ORAL_TABLET | Freq: Every day | ORAL | Status: DC

## 2013-04-20 MED ORDER — AMLODIPINE BESYLATE 2.5 MG PO TABS: 2.5000 mg | ORAL_TABLET | Freq: Every day | ORAL | Status: DC
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
## 2013-04-20 NOTE — Addendum Note (Signed)
Addended by: Jonah Blue on: 04/20/2013 03:07 PM   Modules accepted: Orders

## 2013-04-20 NOTE — Patient Instructions (Addendum)
General Instructions: Please take your medications as prescribed  We will do urine study on you  We will obtain records from pain clinic  Please follow up with Dr Kem Kays in one month   Treatment Goals:  Goals (1 Years of Data) as of 04/20/13   None      Progress Toward Treatment Goals:  Treatment Goal 04/20/2013  Blood pressure at goal    Self Care Goals & Plans:  Self Care Goal 04/20/2013  Manage my medications take my medicines as prescribed; bring my medications to every visit; refill my medications on time  Monitor my health -  Eat healthy foods drink diet soda or water instead of juice or soda; eat more vegetables; eat foods that are low in salt  Be physically active -    No flowsheet data found.   Care Management & Community Referrals:  No flowsheet data found.

## 2013-04-20 NOTE — Assessment & Plan Note (Signed)
Patient appears exaggerated. Imagining of her back and left as noted in overview above is not consistent with physical exam. Patient declines therapies suggested by pain clinic.   Plan  - refill Vicodin for 1 months  - will order UDS today  - will follow up with PCP

## 2013-04-21 LAB — PRESCRIPTION ABUSE MONITORING 15P, URINE
Amphetamine/Meth: NEGATIVE ng/mL
Cannabinoid Scrn, Ur: NEGATIVE ng/mL
Carisoprodol, Urine: NEGATIVE ng/mL
Cocaine Metabolites: NEGATIVE ng/mL
Creatinine, Urine: 152.68 mg/dL (ref >20.0)
Fentanyl, Ur: NEGATIVE ng/mL
Oxycodone Screen, Ur: NEGATIVE ng/mL
Tramadol Scrn, Ur: NEGATIVE ng/mL
Zolpidem, Urine: NEGATIVE ng/mL

## 2013-04-21 NOTE — Progress Notes (Signed)
Case discussed with Dr.Kazibwe at the time of the visit.  We reviewed the resident's history and exam and pertinent patient test results.  I agree with the assessment, diagnosis, and plan of care documented in the resident's note.    

## 2013-04-22 LAB — BENZODIAZEPINES (GC/LC/MS), URINE
Alprazolam metabolite (GC/LC/MS), ur confirm: NEGATIVE ng/mL
Diazepam (GC/LC/MS), ur confirm: NEGATIVE ng/mL
Estazolam (GC/LC/MS), ur confirm: NEGATIVE ng/mL
Flunitrazepam metabolite (GC/LC/MS), ur confirm: NEGATIVE ng/mL
Flurazepam metabolite (GC/LC/MS), ur confirm: NEGATIVE ng/mL
Midazolam (GC/LC/MS), ur confirm: NEGATIVE ng/mL
Nordiazepam (GC/LC/MS), ur confirm: NEGATIVE ng/mL
Temazepam (GC/LC/MS), ur confirm: NEGATIVE ng/mL

## 2013-04-22 LAB — OPIATES/OPIOIDS (LC/MS-MS)
Codeine Urine: NEGATIVE ng/mL
Heroin (6-AM), UR: NEGATIVE ng/mL
Hydrocodone: 1523 ng/mL
Hydromorphone: 191 ng/mL
Morphine Urine: NEGATIVE ng/mL
Noroxycodone, Ur: NEGATIVE ng/mL
Oxycodone, ur: NEGATIVE ng/mL
Oxymorphone: NEGATIVE ng/mL

## 2013-05-19 ENCOUNTER — Other Ambulatory Visit: Payer: Self-pay | Admitting: Internal Medicine

## 2013-05-26 ENCOUNTER — Encounter: Payer: Medicare Other | Admitting: Internal Medicine

## 2013-05-27 ENCOUNTER — Other Ambulatory Visit: Payer: Self-pay | Admitting: *Deleted

## 2013-05-27 MED ORDER — CLONAZEPAM 0.5 MG PO TABS: ORAL_TABLET | ORAL | Status: DC

## 2013-05-27 NOTE — Telephone Encounter (Signed)
Rx called in 

## 2013-05-27 NOTE — Telephone Encounter (Signed)
Last refill 10/4 for # 120

## 2013-06-02 ENCOUNTER — Encounter: Payer: Medicare Other | Admitting: Internal Medicine

## 2013-06-02 ENCOUNTER — Telehealth: Payer: Self-pay | Admitting: *Deleted

## 2013-06-02 ENCOUNTER — Ambulatory Visit (INDEPENDENT_AMBULATORY_CARE_PROVIDER_SITE_OTHER): Payer: Medicare Other | Admitting: Internal Medicine

## 2013-06-02 ENCOUNTER — Encounter: Payer: Self-pay | Admitting: Internal Medicine

## 2013-06-02 VITALS — BP 130/75 | HR 111 | Temp 98.1°F | Ht 67.0 in | Wt 237.3 lb

## 2013-06-02 DIAGNOSIS — M7062 Trochanteric bursitis, left hip: Secondary | ICD-10-CM | POA: Insufficient documentation

## 2013-06-02 DIAGNOSIS — G8929 Other chronic pain: Secondary | ICD-10-CM

## 2013-06-02 DIAGNOSIS — M76899 Other specified enthesopathies of unspecified lower limb, excluding foot: Secondary | ICD-10-CM

## 2013-06-02 MED ORDER — HYDROCODONE-ACETAMINOPHEN 5-325 MG PO TABS: 1.0000 | ORAL_TABLET | Freq: Four times a day (QID) | ORAL | Status: DC | PRN
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
## 2013-06-02 NOTE — Telephone Encounter (Signed)
CALLED MS. Fujii (PER DR SCHOOLER)  TO LET HER KNOW THAT MR. Nobis'S X-RAY DO NOT SHOW AND BROKEN BONES NOR FRACTURES ON HIS X-RAY. ONLY SHOW ARTHRITIS.  Albertine Lafoy NTII 11-20-014  4:42PM

## 2013-06-02 NOTE — Patient Instructions (Addendum)
We have scheduled you with Sports Medicine to assist in further evaluation of your left leg pain. It is recommended that you consider following-up with the pain clinic to try alternatives to the injections that you did to want to have performed. Your pain medication was refilled today. If you will need another refill, an opioid contract will need to be completed. Follow-up with your Primary Care doctor.

## 2013-06-04 NOTE — Progress Notes (Signed)
Case discussed with Dr. Schooler soon after the resident saw the patient.  We reviewed the resident's history and exam and pertinent patient test results.  I agree with the assessment, diagnosis and plan of care documented in the resident's note. 

## 2013-06-14 ENCOUNTER — Encounter: Payer: Self-pay | Admitting: Sports Medicine

## 2013-06-14 ENCOUNTER — Ambulatory Visit (INDEPENDENT_AMBULATORY_CARE_PROVIDER_SITE_OTHER): Payer: Medicare Other | Admitting: Sports Medicine

## 2013-06-14 VITALS — BP 114/68 | HR 76 | Ht 67.0 in | Wt 237.0 lb

## 2013-06-14 DIAGNOSIS — G8929 Other chronic pain: Secondary | ICD-10-CM

## 2013-06-14 NOTE — Assessment & Plan Note (Signed)
Pain is not consistent with findings on imaging and physical exam is limited due to pt's response to light touch. Pt declined injections stating is afraid of needles and that Cortisone gives her rash and makes her sick although she could not recall when, where or for what she had steroid shots. No further recommendations than to control her chronic pain syndrome are indicated at this time.

## 2013-06-14 NOTE — Progress Notes (Signed)
Sports Medicine Office Visit Note   Subjective:   Patient ID: Sarah [name], female  DOB: 1945/11/19, 67 y.o.. MRN: [id number]   Pt that comes today for evaluation of her left hip and leg pain, she has been evaluated with Xrays and 2 MRIs that showed arthritic changes but no stenosis. She has also had Physical therapy, evaluation by Pain clinic and treatment by chiropractor. Pain is reported to be located on her left side back, thigh and knee. She reports swelling but no redness. Pt declined injections offered by [facility] and reports opioid treatment is the only option that keeps her pain under control.     PMHx is significant for Chronic pain, left tibial plateau fracture s/p surgery in 2012,  HTN, HLD.   Review of Systems:  Per HPI  Objective:   Physical Exam: Gen: NAD, walking assisted with a cane. MSK: pt reports pain to minimal light touch of left side of back, hip, thigh and knee, so maneuvers to determine ROM were limited. No erythema or effusion observed. Walks with a slight limp.  Assessment & Plan:    Chronic left lower extremity pain  Unfortunately I have nothing further to add to this patient's treatment. She is not interested in any sort of injection. She has already been seen and evaluated by pain management. My recommendation is for her to followup with her primary care physician.

## 2013-07-01 ENCOUNTER — Other Ambulatory Visit: Payer: Self-pay | Admitting: Internal Medicine

## 2013-07-01 NOTE — Telephone Encounter (Signed)
Called to pharm 

## 2013-07-22 ENCOUNTER — Telehealth: Payer: Self-pay | Admitting: Internal Medicine

## 2013-07-22 NOTE — Telephone Encounter (Signed)
Pt has been in pain and calling clinic w/o answer or call back x 3 days.  She needs a Rx refill of her narcotic Rx.  She has gabapentin and taking that.  Advised to go to the ED if pain is not in control otherwise will forward msg to triage for Monday    [facility] MD   Pt phone # [phone]

## 2013-07-25 ENCOUNTER — Other Ambulatory Visit: Payer: Self-pay | Admitting: *Deleted

## 2013-07-25 DIAGNOSIS — G8929 Other chronic pain: Secondary | ICD-10-CM

## 2013-07-25 NOTE — Telephone Encounter (Signed)
Found Rx from 04/2013 for same dose of pain med. Pt aware and to pick up Rx today. Hilda Blades Aryahna Spagna RN 07/25/13 10:20AM

## 2013-08-02 ENCOUNTER — Other Ambulatory Visit: Payer: Self-pay | Admitting: *Deleted

## 2013-08-02 NOTE — Telephone Encounter (Signed)
Pt here with spouse, requesting rx for klonopin.  Has appt 02/05 with DrPaya.  Pt informed that request will be sent to pcp for review.Despina Hidden Cassady1/20/20154:52 PM

## 2013-08-04 MED ORDER — CLONAZEPAM 0.5 MG PO TABS: ORAL_TABLET | ORAL | Status: DC

## 2013-08-18 ENCOUNTER — Ambulatory Visit (INDEPENDENT_AMBULATORY_CARE_PROVIDER_SITE_OTHER): Payer: Medicare Other | Admitting: Internal Medicine

## 2013-08-18 ENCOUNTER — Encounter: Payer: Self-pay | Admitting: Internal Medicine

## 2013-08-18 VITALS — BP 130/73 | HR 86 | Temp 96.8°F | Ht 66.0 in | Wt 239.9 lb

## 2013-08-18 DIAGNOSIS — Z9189 Other specified personal risk factors, not elsewhere classified: Secondary | ICD-10-CM

## 2013-08-18 DIAGNOSIS — E785 Hyperlipidemia, unspecified: Secondary | ICD-10-CM

## 2013-08-18 DIAGNOSIS — M79609 Pain in unspecified limb: Secondary | ICD-10-CM

## 2013-08-18 DIAGNOSIS — Z9289 Personal history of other medical treatment: Secondary | ICD-10-CM

## 2013-08-18 DIAGNOSIS — E669 Obesity, unspecified: Secondary | ICD-10-CM

## 2013-08-18 DIAGNOSIS — G8929 Other chronic pain: Secondary | ICD-10-CM

## 2013-08-18 DIAGNOSIS — R7309 Other abnormal glucose: Secondary | ICD-10-CM

## 2013-08-18 DIAGNOSIS — Z13820 Encounter for screening for osteoporosis: Secondary | ICD-10-CM

## 2013-08-18 DIAGNOSIS — F411 Generalized anxiety disorder: Secondary | ICD-10-CM

## 2013-08-18 DIAGNOSIS — I10 Essential (primary) hypertension: Secondary | ICD-10-CM

## 2013-08-18 MED ORDER — HYDROCODONE-ACETAMINOPHEN 5-325 MG PO TABS: 1.0000 | ORAL_TABLET | Freq: Four times a day (QID) | ORAL | Status: DC | PRN

## 2013-08-18 MED ORDER — SERTRALINE HCL 50 MG PO TABS: ORAL_TABLET | ORAL | Status: DC

## 2013-08-18 MED ORDER — CLONAZEPAM 0.5 MG PO TABS: ORAL_TABLET | ORAL | Status: DC

## 2013-08-18 NOTE — Patient Instructions (Signed)
-  Please call Dr. Letta Pate office to reconsider the injection of your hip. -Start sertraline for anxiety at one pill nightly for a month, then increase to two pills at night. Call me with any concerning side effects. -Please have your bone density test performed. -Physical therapy referral made. -Return in 3 months.
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
## 2013-08-25 ENCOUNTER — Ambulatory Visit (HOSPITAL_COMMUNITY): Payer: Medicare Other

## 2013-08-27 ENCOUNTER — Other Ambulatory Visit: Payer: Self-pay | Admitting: Internal Medicine

## 2013-09-13 ENCOUNTER — Other Ambulatory Visit: Payer: Self-pay | Admitting: Internal Medicine

## 2013-09-15 ENCOUNTER — Ambulatory Visit: Payer: Medicare Other | Admitting: Sports Medicine

## 2013-09-19 ENCOUNTER — Telehealth: Payer: Self-pay | Admitting: *Deleted

## 2013-09-19 NOTE — Telephone Encounter (Signed)
Received PA request from [facility] ([address]) [id number] for pt's clonazepam 0.5mg  tabs take 2 tabs twice daily prn anxiety #120. Pt's insurance will pay for up to 3 tabs daily.  Pt currently taking up to 4 tabs daily.  Will forward to pcp for review, please advise.Despina Hidden Cassady3/9/201510:19 AM         [phone]  ID# [id number]

## 2013-09-19 NOTE — Telephone Encounter (Signed)
Called and attempting to obtain authorization. They stated we will receive decision by fax.

## 2013-09-20 ENCOUNTER — Ambulatory Visit: Payer: Medicare Other | Attending: Internal Medicine | Admitting: Physical Therapy

## 2013-09-20 DIAGNOSIS — M6281 Muscle weakness (generalized): Secondary | ICD-10-CM | POA: Insufficient documentation

## 2013-09-20 DIAGNOSIS — M2560 Stiffness of unspecified joint, not elsewhere classified: Secondary | ICD-10-CM | POA: Insufficient documentation

## 2013-09-20 DIAGNOSIS — M2550 Pain in unspecified joint: Secondary | ICD-10-CM | POA: Insufficient documentation

## 2013-09-20 DIAGNOSIS — R5381 Other malaise: Secondary | ICD-10-CM | POA: Insufficient documentation

## 2013-09-20 DIAGNOSIS — IMO0001 Reserved for inherently not codable concepts without codable children: Secondary | ICD-10-CM | POA: Insufficient documentation

## 2013-09-22 ENCOUNTER — Ambulatory Visit: Payer: Medicare Other | Admitting: Physical Therapy

## 2013-09-26 ENCOUNTER — Other Ambulatory Visit: Payer: Self-pay | Admitting: Internal Medicine

## 2013-09-26 ENCOUNTER — Telehealth: Payer: Self-pay | Admitting: *Deleted

## 2013-09-26 NOTE — Telephone Encounter (Signed)
Hi Glenda, She is taking klonopin as well. It is not a good idea to mix valium and klonopin (clonazepam) together. She can stop the sertraline and use klonopin (clonazepam) as Rx. Please let her know this.

## 2013-09-26 NOTE — Telephone Encounter (Signed)
Thanks Glenda. The use of valium and clonazepam together is not a good idea as it can lead to respiratory depression, change in mental status, and subsequent consequences.

## 2013-09-26 NOTE — Telephone Encounter (Signed)
Pt informed to stop Sertaline. Also not to mix Valium and Clonazepam; and Valium was not refilled. She should try using Clonazepam only - reluctantly she agreed.

## 2013-09-26 NOTE — Telephone Encounter (Signed)
Return pt's call - pt states Sertaline is not helping; when she takes it, she gets a headache and feels shaky. And Valium seems to work better. Thanks

## 2013-09-27 ENCOUNTER — Ambulatory Visit: Payer: Medicare Other | Admitting: Physical Therapy

## 2013-09-29 ENCOUNTER — Ambulatory Visit: Payer: Medicare Other | Admitting: Physical Therapy

## 2013-09-29 ENCOUNTER — Other Ambulatory Visit: Payer: Self-pay | Admitting: Internal Medicine

## 2013-09-30 ENCOUNTER — Telehealth: Payer: Self-pay | Admitting: *Deleted

## 2013-09-30 ENCOUNTER — Other Ambulatory Visit: Payer: Self-pay | Admitting: Internal Medicine

## 2013-09-30 MED ORDER — SERTRALINE HCL 100 MG PO TABS: 100.0000 mg | ORAL_TABLET | Freq: Every day | ORAL | Status: DC

## 2013-09-30 MED ORDER — DIAZEPAM 2 MG PO TABS: 2.0000 mg | ORAL_TABLET | Freq: Two times a day (BID) | ORAL | Status: DC | PRN

## 2013-09-30 NOTE — Telephone Encounter (Signed)
Called patient back at home number. It appears that her pharmacy did not dispense her klonopin due to insurance coverage, even though I called and requested it to be covered. She did not stop the sertraline as advised and now wants to continue taking it. She had called a few days earlier and stated that it was giving her a headache. At this time, she states she wants valium instead of klonopin. I spent 20 minutes on the phone with her going over all of her medications. At this time, she wants to continue sertraline 100 mg daily so I will put this back on her list. I will add valium 2 mg PO bid PRN. Educated on side effects and she is agreeable to this medication. We will not purse klonopin (and she does not have any in her possession).

## 2013-09-30 NOTE — Telephone Encounter (Signed)
Rx called in 

## 2013-09-30 NOTE — Telephone Encounter (Signed)
Pt called very confused about her meds.  She is not sure what to take and wants to increase Sertraline as it is not working.  I don't think she is taking klonopin to 1 mg PO bid. Med list does not have Sertraline listed.  Please call pt and review her anxiety meds.  She is anxious. Pt # [phone]

## 2013-10-04 ENCOUNTER — Ambulatory Visit: Payer: Medicare Other | Admitting: Physical Therapy

## 2013-10-06 ENCOUNTER — Ambulatory Visit: Payer: Medicare Other | Admitting: Physical Therapy

## 2013-10-11 ENCOUNTER — Ambulatory Visit: Payer: Medicare Other | Admitting: Physical Therapy

## 2013-10-13 ENCOUNTER — Ambulatory Visit: Payer: Medicare Other | Attending: Internal Medicine | Admitting: Physical Therapy

## 2013-10-13 DIAGNOSIS — M2560 Stiffness of unspecified joint, not elsewhere classified: Secondary | ICD-10-CM | POA: Insufficient documentation

## 2013-10-13 DIAGNOSIS — R5381 Other malaise: Secondary | ICD-10-CM | POA: Insufficient documentation

## 2013-10-13 DIAGNOSIS — M2550 Pain in unspecified joint: Secondary | ICD-10-CM | POA: Insufficient documentation

## 2013-10-13 DIAGNOSIS — IMO0001 Reserved for inherently not codable concepts without codable children: Secondary | ICD-10-CM | POA: Insufficient documentation

## 2013-10-13 DIAGNOSIS — M6281 Muscle weakness (generalized): Secondary | ICD-10-CM | POA: Insufficient documentation

## 2013-10-18 ENCOUNTER — Ambulatory Visit: Payer: Medicare Other | Admitting: Physical Therapy

## 2013-10-20 ENCOUNTER — Ambulatory Visit: Payer: Medicare Other | Admitting: Physical Therapy

## 2013-10-25 ENCOUNTER — Ambulatory Visit: Payer: Medicare Other | Admitting: Physical Therapy

## 2013-10-27 ENCOUNTER — Ambulatory Visit: Payer: Medicare Other | Admitting: Physical Therapy

## 2013-11-01 ENCOUNTER — Ambulatory Visit: Payer: Medicare Other | Admitting: Physical Therapy

## 2013-11-03 ENCOUNTER — Ambulatory Visit: Payer: Medicare Other | Admitting: Physical Therapy

## 2013-11-08 ENCOUNTER — Ambulatory Visit: Payer: Medicare Other | Admitting: Physical Therapy

## 2013-11-10 ENCOUNTER — Other Ambulatory Visit: Payer: Self-pay | Admitting: Internal Medicine

## 2013-11-10 ENCOUNTER — Ambulatory Visit: Payer: Medicare Other | Admitting: Physical Therapy

## 2013-11-11 NOTE — Telephone Encounter (Signed)
Diazepam rx called to [facility].

## 2013-11-16 ENCOUNTER — Ambulatory Visit: Payer: Medicare Other | Admitting: Physical Therapy

## 2013-11-18 ENCOUNTER — Ambulatory Visit: Payer: Medicare Other | Attending: Internal Medicine | Admitting: Physical Therapy

## 2013-11-18 DIAGNOSIS — IMO0001 Reserved for inherently not codable concepts without codable children: Secondary | ICD-10-CM | POA: Insufficient documentation

## 2013-11-18 DIAGNOSIS — M2560 Stiffness of unspecified joint, not elsewhere classified: Secondary | ICD-10-CM | POA: Insufficient documentation

## 2013-11-18 DIAGNOSIS — M6281 Muscle weakness (generalized): Secondary | ICD-10-CM | POA: Insufficient documentation

## 2013-11-18 DIAGNOSIS — R5381 Other malaise: Secondary | ICD-10-CM | POA: Insufficient documentation

## 2013-11-18 DIAGNOSIS — M2550 Pain in unspecified joint: Secondary | ICD-10-CM | POA: Insufficient documentation

## 2013-11-26 ENCOUNTER — Other Ambulatory Visit: Payer: Self-pay | Admitting: Internal Medicine

## 2013-12-10 ENCOUNTER — Other Ambulatory Visit: Payer: Self-pay | Admitting: Internal Medicine

## 2013-12-12 ENCOUNTER — Other Ambulatory Visit: Payer: Self-pay | Admitting: *Deleted

## 2013-12-12 ENCOUNTER — Encounter: Payer: Self-pay | Admitting: Internal Medicine

## 2013-12-12 ENCOUNTER — Ambulatory Visit (INDEPENDENT_AMBULATORY_CARE_PROVIDER_SITE_OTHER): Payer: Medicare Other | Admitting: Internal Medicine

## 2013-12-12 VITALS — BP 118/70 | HR 71 | Temp 98.1°F | Wt 214.1 lb

## 2013-12-12 DIAGNOSIS — G8929 Other chronic pain: Secondary | ICD-10-CM

## 2013-12-12 DIAGNOSIS — F411 Generalized anxiety disorder: Secondary | ICD-10-CM

## 2013-12-12 DIAGNOSIS — I10 Essential (primary) hypertension: Secondary | ICD-10-CM

## 2013-12-12 DIAGNOSIS — M79609 Pain in unspecified limb: Secondary | ICD-10-CM

## 2013-12-12 MED ORDER — ATORVASTATIN CALCIUM 10 MG PO TABS: ORAL_TABLET | ORAL | Status: DC

## 2013-12-12 MED ORDER — DIAZEPAM 5 MG PO TABS: ORAL_TABLET | ORAL | Status: DC
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
## 2013-12-12 NOTE — Assessment & Plan Note (Signed)
Well controlled.  Plans: Continue current regimen. 

## 2013-12-12 NOTE — Patient Instructions (Signed)
Follow up with your PCP in a month. Take all the medications as recommended below.

## 2013-12-12 NOTE — Assessment & Plan Note (Signed)
Appears to be generalised anxiety disorder with no episodes of panic spells. Being treated with SSRI (Zoloft) + LA BZD's. And seems to be doing well although patient states that the dose of diazepam is not strong enough. Discussed with the attending regarding further management and plan.  Plans: Increase the Diazepam to 5 mg BID and continue current strength of Zoloft.  Recommended to follow up with PCP in a month to see how the symptoms are.
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
## 2013-12-14 NOTE — Progress Notes (Signed)
Case discussed with Dr. Boggala at the time of the visit.  We reviewed the resident's history and exam and pertinent patient test results.  I agree with the assessment, diagnosis, and plan of care documented in the resident's note. 

## 2013-12-27 ENCOUNTER — Other Ambulatory Visit: Payer: Self-pay | Admitting: Internal Medicine

## 2014-03-04 ENCOUNTER — Other Ambulatory Visit: Payer: Self-pay | Admitting: Internal Medicine

## 2014-03-15 ENCOUNTER — Other Ambulatory Visit: Payer: Self-pay | Admitting: Internal Medicine

## 2014-03-27 ENCOUNTER — Encounter: Payer: Self-pay | Admitting: Internal Medicine

## 2014-03-27 ENCOUNTER — Ambulatory Visit: Payer: Medicare Other | Admitting: Internal Medicine

## 2014-03-27 ENCOUNTER — Ambulatory Visit (INDEPENDENT_AMBULATORY_CARE_PROVIDER_SITE_OTHER): Payer: Medicare Other | Admitting: Internal Medicine

## 2014-03-27 VITALS — BP 124/58 | HR 58 | Temp 98.0°F | Wt 210.3 lb

## 2014-03-27 DIAGNOSIS — F411 Generalized anxiety disorder: Secondary | ICD-10-CM

## 2014-03-27 DIAGNOSIS — M79609 Pain in unspecified limb: Secondary | ICD-10-CM

## 2014-03-27 DIAGNOSIS — M79605 Pain in left leg: Secondary | ICD-10-CM | POA: Insufficient documentation

## 2014-03-27 DIAGNOSIS — Z0000 Encounter for general adult medical examination without abnormal findings: Secondary | ICD-10-CM

## 2014-03-27 DIAGNOSIS — I10 Essential (primary) hypertension: Secondary | ICD-10-CM

## 2014-03-27 MED ORDER — HYDROCODONE-ACETAMINOPHEN 5-325 MG PO TABS: 1.0000 | ORAL_TABLET | Freq: Two times a day (BID) | ORAL | Status: DC | PRN

## 2014-03-27 NOTE — Progress Notes (Signed)
Patient ID: Sarah [name], female   DOB: Nov 05, 1945, 68 y.o.   MRN: [id number] Case discussed with Dr. Hayes Ludwig soon after the resident saw the patient.  We reviewed the resident's history and exam and pertinent patient test results.  I agree with the assessment, diagnosis, and plan of care documented in the resident's note.

## 2014-03-27 NOTE — Assessment & Plan Note (Signed)
She states that she is doing better with sertraline 100mg  daily, has not needed valium.  Continue Zoloft 100mg  daily

## 2014-03-27 NOTE — Patient Instructions (Signed)
-  Follow up in 1 month for refill of your pain medication.    Thank you for bringing your medicines today. This helps [place] keep you safe from mistakes.

## 2014-03-27 NOTE — Assessment & Plan Note (Signed)
She has acute on chronic pain.  Rx Norco 5-325mg  BID PRN for pain Referred to Sports medicine Pt advised to f/u in 1 month with PCP to discuss medication contract if her pain is persistent, she verbalized understanding and agreed with this plan
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
## 2014-03-27 NOTE — Assessment & Plan Note (Signed)
BP Readings from Last 3 Encounters:  03/27/14 124/58  12/12/13 118/70  08/18/13 130/73    Lab Results  Component Value Date   NA 140 09/09/2012   K 4.6 09/09/2012   CREATININE 0.92 09/09/2012    Assessment: Blood pressure control:  Controlled  Progress toward BP goal:   At goal Comments: hs eis on Norvasc 2.5mg  daily, HCTZ 12.5mg  daily  Plan: Medications:  continue current medications Educational resources provided:   Self management tools provided:   Other plans: Follow up in 1 month with PCP

## 2014-03-27 NOTE — Assessment & Plan Note (Signed)
Declined the flu vaccine today but will consider it during her next visit.

## 2014-04-03 ENCOUNTER — Ambulatory Visit: Payer: Medicare Other | Admitting: Sports Medicine

## 2014-04-13 NOTE — Addendum Note (Signed)
Addended by: Hulan Fray on: 04/13/2014 05:51 PM   Modules accepted: Orders

## 2014-05-05 ENCOUNTER — Other Ambulatory Visit: Payer: Self-pay | Admitting: Internal Medicine

## 2014-05-18 ENCOUNTER — Emergency Department (HOSPITAL_COMMUNITY): Payer: Medicare Other

## 2014-05-18 ENCOUNTER — Encounter (HOSPITAL_COMMUNITY): Payer: Self-pay

## 2014-05-18 ENCOUNTER — Emergency Department (HOSPITAL_COMMUNITY)
Admission: EM | Admit: 2014-05-18 | Discharge: 2014-05-18 | Disposition: A | Discharge status: Home / Self Care | Payer: Medicare Other | Attending: Emergency Medicine | Admitting: Emergency Medicine

## 2014-05-18 DIAGNOSIS — R55 Syncope and collapse: Secondary | ICD-10-CM | POA: Insufficient documentation

## 2014-05-18 DIAGNOSIS — Z79899 Other long term (current) drug therapy: Secondary | ICD-10-CM | POA: Diagnosis not present

## 2014-05-18 DIAGNOSIS — Y9389 Activity, other specified: Secondary | ICD-10-CM | POA: Diagnosis not present

## 2014-05-18 DIAGNOSIS — Y92511 Restaurant or cafe as the place of occurrence of the external cause: Secondary | ICD-10-CM | POA: Insufficient documentation

## 2014-05-18 DIAGNOSIS — W01198A Fall on same level from slipping, tripping and stumbling with subsequent striking against other object, initial encounter: Secondary | ICD-10-CM | POA: Diagnosis not present

## 2014-05-18 DIAGNOSIS — E780 Pure hypercholesterolemia: Secondary | ICD-10-CM | POA: Insufficient documentation

## 2014-05-18 DIAGNOSIS — W19XXXA Unspecified fall, initial encounter: Secondary | ICD-10-CM

## 2014-05-18 DIAGNOSIS — S0990XA Unspecified injury of head, initial encounter: Secondary | ICD-10-CM | POA: Insufficient documentation

## 2014-05-18 DIAGNOSIS — M545 Low back pain, unspecified: Secondary | ICD-10-CM

## 2014-05-18 DIAGNOSIS — S3992XA Unspecified injury of lower back, initial encounter: Secondary | ICD-10-CM | POA: Insufficient documentation

## 2014-05-18 DIAGNOSIS — F419 Anxiety disorder, unspecified: Secondary | ICD-10-CM | POA: Insufficient documentation

## 2014-05-18 DIAGNOSIS — E669 Obesity, unspecified: Secondary | ICD-10-CM | POA: Diagnosis not present

## 2014-05-18 DIAGNOSIS — I10 Essential (primary) hypertension: Secondary | ICD-10-CM | POA: Insufficient documentation

## 2014-05-18 DIAGNOSIS — Z872 Personal history of diseases of the skin and subcutaneous tissue: Secondary | ICD-10-CM | POA: Insufficient documentation

## 2014-05-18 MED ORDER — HYDROCODONE-ACETAMINOPHEN 5-325 MG PO TABS: 1.0000 | ORAL_TABLET | Freq: Four times a day (QID) | ORAL | Status: DC | PRN

## 2014-05-18 MED ORDER — OXYCODONE-ACETAMINOPHEN 5-325 MG PO TABS
2.0000 | ORAL_TABLET | Freq: Once | ORAL | Status: AC
  Administered 2014-05-18: 2 via ORAL
  Filled 2014-05-18 (×2): qty 2

## 2014-05-18 MED ORDER — DIAZEPAM 5 MG PO TABS: 10.0000 mg | ORAL_TABLET | Freq: Once | ORAL | Status: DC

## 2014-05-18 MED ORDER — DIAZEPAM 5 MG PO TABS
5.0000 mg | ORAL_TABLET | Freq: Once | ORAL | Status: AC
  Administered 2014-05-18: 5 mg via ORAL
  Filled 2014-05-18: qty 1

## 2014-05-18 MED ORDER — DIAZEPAM 5 MG PO TABS: 5.0000 mg | ORAL_TABLET | Freq: Four times a day (QID) | ORAL | Status: DC | PRN

## 2014-05-18 MED ORDER — NAPROXEN 500 MG PO TABS: 500.0000 mg | ORAL_TABLET | Freq: Two times a day (BID) | ORAL | Status: DC

## 2014-05-18 NOTE — ED Notes (Signed)
Patient transported to X-ray 

## 2014-05-18 NOTE — ED Notes (Signed)
Patient back in room following XRay/CT.
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
## 2014-05-18 NOTE — ED Provider Notes (Signed)
CSN: [id number]     Arrival date & time 05/18/14  2000 History   First MD Initiated Contact with Patient 05/18/14 2010     Chief Complaint  Patient presents with  . Fall     (Consider location/radiation/quality/duration/timing/severity/associated sxs/prior Treatment) Patient is a 68 y.o. female presenting with fall. The history is provided by the patient and medical records. No language interpreter was used.  Fall Associated symptoms include arthralgias (left great toe). Pertinent negatives include no abdominal pain, chest pain, coughing, diaphoresis, fatigue, fever, headaches, nausea, rash or vomiting.     Sarah [name] is a 68 y.o. female  with a hx of obesity, HTN, anxiety, peripheral edema presents to the Emergency Department complaining of gradual, persistent, progressively worsening low back pain onset just PTA when she tripped coming out of a restaurant and fell, hitting her head on a truck parked by the front door.  Pt reports unknown LOC.  Pt arrives via EMS, fully immobilized. Associated symptoms include left great toe pain, total back pain.  Nothing makes it better and moving makes it worse.  Pt denies fever, chills, chest pain, SOB, abd pain, N/V/D, weakness, dizziness, dysuria.  Though patient is unsure of loss of consciousness she does deny a syncopal episode causing her fall. She is adamant that her foot got caught on a rug and she fell..     Past Medical History  Diagnosis Date  . Obesity   . Cellulitis of left foot   . Dystrophic nail   . Abnormal electrocardiogram   . Postmenopausal status   . Peripheral edema   . Anxiety   . High cholesterol   . Hypertension    Past Surgical History  Procedure Laterality Date  . Closed reduction tibial fracture  04/2011  . Tubal ligation  1994   Family History  Problem Relation Age of Onset  . Diabetes Mother   . Hypertension Mother   . Aneurysm Mother   . Cancer Father   . Colon cancer Father 15  . Hypertension Daughter     History  Substance Use Topics  . Smoking status: Never Smoker   . Smokeless tobacco: Never Used  . Alcohol Use: No   OB History    No data available     Review of Systems  Constitutional: Negative for fever, diaphoresis, appetite change, fatigue and unexpected weight change.  HENT: Negative for mouth sores.   Eyes: Negative for visual disturbance.  Respiratory: Negative for cough, chest tightness, shortness of breath and wheezing.   Cardiovascular: Negative for chest pain.  Gastrointestinal: Negative for nausea, vomiting, abdominal pain, diarrhea and constipation.  Endocrine: Negative for polydipsia, polyphagia and polyuria.  Genitourinary: Negative for dysuria, urgency, frequency and hematuria.  Musculoskeletal: Positive for back pain and arthralgias (left great toe). Negative for neck stiffness.  Skin: Negative for rash.  Allergic/Immunologic: Negative for immunocompromised state.  Neurological: Positive for syncope (unknown). Negative for light-headedness and headaches.  Hematological: Does not bruise/bleed easily.  Psychiatric/Behavioral: Negative for sleep disturbance. The patient is not nervous/anxious.       Allergies  Review of patient's allergies indicates no known allergies.  Home Medications   Prior to Admission medications   Medication Sig Start Date End Date Taking? Authorizing Provider  amLODipine (NORVASC) 2.5 MG tablet TAKE 1 TABLET BY MOUTH EVERY DAY Patient taking differently: Take 1 tablet by mouth every day 03/15/14  Yes Nischal Narendra, MD  atorvastatin (LIPITOR) 10 MG tablet TAKE 1 TABLET BY MOUTH DAILY Patient taking  differently: Take 1 Tablet by mouth daily 03/15/14  Yes Nischal Narendra, MD  Calcium Carbonate-Vit D-Min (CALCIUM 600+D PLUS MINERALS) 600-400 MG-UNIT CHEW Chew 1 tablet by mouth daily. 12/02/12  Yes Alejandro Paya, DO  diazepam (VALIUM) 5 MG tablet Take 1 tablet twice daily. 12/12/13  Yes Vijaya Mercer Pod, MD  gabapentin (NEURONTIN)  100 MG capsule TAKE ONE CAPSULE BY MOUTH THREE TIMES DAILY Patient taking differently: Take 1 capsule by mouth three times daily 03/06/14  Yes Nischal Narendra, MD  hydrochlorothiazide (HYDRODIURIL) 12.5 MG tablet TAKE 1 TABLET BY MOUTH EVERY DAY Patient taking differently: Take 1 tablet by mouth every day 05/05/14  Yes Nischal Narendra, MD  HYDROcodone-acetaminophen (NORCO/VICODIN) 5-325 MG per tablet Take 1 tablet by mouth 2 (two) times daily as needed. 03/27/14  Yes Blain Pais, MD  psyllium (METAMUCIL SMOOTH TEXTURE) 28 % packet Take 1 packet in an 8 oz glass of water twice a day as needed for constipation.  Do not take within 2 hours of taking other medications. 06/20/11  Yes Axel Filler, MD  sertraline (ZOLOFT) 100 MG tablet TAKE 1 TABLET BY MOUTH EVERY DAY Patient taking differently: Take 1 tablet by mouth every day 03/06/14  Yes Nischal Narendra, MD  diazepam (VALIUM) 5 MG tablet Take 1 tablet (5 mg total) by mouth every 6 (six) hours as needed for anxiety (spasms). 05/18/14   Zebbie Ace, PA-C  HYDROcodone-acetaminophen (NORCO/VICODIN) 5-325 MG per tablet Take 1 tablet by mouth every 6 (six) hours as needed for moderate pain or severe pain. 05/18/14   Teigan Sahli, PA-C  naproxen (NAPROSYN) 500 MG tablet Take 1 tablet (500 mg total) by mouth 2 (two) times daily with a meal. 05/18/14   Brodyn Depuy, PA-C   BP 124/70 mmHg  Pulse 70  Temp(Src) 98.3 F (36.8 C) (Oral)  Resp 20  SpO2 100% Physical Exam  Constitutional: She is oriented to person, place, and time. She appears well-developed and well-nourished. No distress.  HENT:  Head: Normocephalic and atraumatic.  Nose: Nose normal.  Mouth/Throat: Uvula is midline, oropharynx is clear and moist and mucous membranes are normal.  Eyes: Conjunctivae and EOM are normal. Pupils are equal, round, and reactive to light.  Neck: Normal range of motion. No spinous process tenderness and no muscular tenderness present.  No rigidity. Normal range of motion present.  c-collar in place - no range of motion testing mild midline cervical tenderness mild paraspinal tenderness  Cardiovascular: Normal rate, regular rhythm, normal heart sounds and intact distal pulses.   No murmur heard. Pulses:      Radial pulses are 2+ on the right side, and 2+ on the left side.       Dorsalis pedis pulses are 2+ on the right side, and 2+ on the left side.       Posterior tibial pulses are 2+ on the right side, and 2+ on the left side.  Pulmonary/Chest: Effort normal and breath sounds normal. No accessory muscle usage. No respiratory distress. She has no decreased breath sounds. She has no wheezes. She has no rhonchi. She has no rales. She exhibits no tenderness and no bony tenderness.  No contusion or ecchymosis No flail segment, crepitus or deformity Equal chest expansion  Abdominal: Soft. Normal appearance and bowel sounds are normal. There is no tenderness. There is no rigidity, no guarding and no CVA tenderness.  No contusion or ecchymosis Abd soft and nontender  Musculoskeletal: Normal range of motion.       Thoracic  back: She exhibits normal range of motion.       Lumbar back: She exhibits normal range of motion.  Full range of motion of the T-spine and L-spine mild tenderness to palpation of the spinous processes of the upperT-spine and entire L-spine Mild tenderness to palpation of the paraspinous muscles of the T-spine and L-spine No step-offs or deformities to the C-spine, T-spine or L-spine  Lymphadenopathy:    She has no cervical adenopathy.  Neurological: She is alert and oriented to person, place, and time. She has normal reflexes. No cranial nerve deficit. GCS eye subscore is 4. GCS verbal subscore is 5. GCS motor subscore is 6.  Reflex Scores:      Bicep reflexes are 2+ on the right side and 2+ on the left side.      Brachioradialis reflexes are 2+ on the right side and 2+ on the left side.      Patellar  reflexes are 2+ on the right side and 2+ on the left side.      Achilles reflexes are 2+ on the right side and 2+ on the left side. Speech is clear and goal oriented, follows commands Normal 5/5 strength in upper and lower extremities bilaterally including dorsiflexion and plantar flexion, strong and equal grip strength Sensation normal to light and sharp touch Moves extremities without ataxia, coordination intact Gait testing deferred No Clonus  Skin: Skin is warm and dry. No rash noted. She is not diaphoretic. No erythema.  Psychiatric: She has a normal mood and affect.  Nursing note and vitals reviewed.   ED Course  Procedures (including critical care time) Labs Review Labs Reviewed - No data to display  Imaging Review Dg Thoracic Spine 2 View  05/18/2014   CLINICAL DATA:  Tripped over a rug at [employer] tonight. Fall. Back pain.  EXAM: THORACIC SPINE - 2 VIEW  COMPARISON:  Chest radiography 07/25/2012  FINDINGS: alignment is normal. No evidence of spinal fracture. Ordinary bridging osteophytes in the mid thoracic region. Posterior medial ribs appear unremarkable.  IMPRESSION: Negative.   Electronically Signed   By: Nelson Chimes M.D.   On: 05/18/2014 21:09   Dg Lumbar Spine Complete  05/18/2014   CLINICAL DATA:  Trip and fall injury with low back pain.  EXAM: LUMBAR SPINE - COMPLETE 4+ VIEW  COMPARISON:  07/25/2012  FINDINGS: Sacralization of L5. Degenerative changes in the lower lumbar spine and facet joints. Slight anterior subluxation of L4 on L5, stable since prior study. No vertebral compression deformities. No focal bone lesion or bone destruction. Bone cortex and trabecular architecture appear intact. No change since prior study.  IMPRESSION: Degenerative changes in the lumbar spine. No acute bony abnormalities.   Electronically Signed   By: Lucienne Capers M.D.   On: 05/18/2014 21:24   Ct Head Wo Contrast  05/18/2014   CLINICAL DATA:  Unwitnessed fall. Tripped on  this side walk when exiting [employer]. Trauma to the face by striking a parked car.  EXAM: CT HEAD WITHOUT CONTRAST  CT CERVICAL SPINE WITHOUT CONTRAST  TECHNIQUE: Multidetector CT imaging of the head and cervical spine was performed following the standard protocol without intravenous contrast. Multiplanar CT image reconstructions of the cervical spine were also generated.  COMPARISON:  07/25/2012  FINDINGS: CT HEAD FINDINGS  The brain has a normal appearance without evidence of atrophy, infarction, mass lesion, hemorrhage, hydrocephalus or extra-axial collection. The calvarium is unremarkable. The paranasal sinuses, middle ears and mastoids are clear. The  patient has a tendency towards dural calcification, a normal variant  CT CERVICAL SPINE FINDINGS  No fracture or traumatic malalignment. There is chronic fusion at C5-6. There is adjacent segment degenerative spondylosis at C4-5 and C6-7 with endplate osteophytes. No soft tissue swelling.  IMPRESSION: Head CT:  Normal study.  Cervical spine CT: No acute or traumatic finding. Chronic fusion at C5-6 with degenerative spondylosis at C4-5 and C6-7.   Electronically Signed   By: Nelson Chimes M.D.   On: 05/18/2014 21:32   Ct Cervical Spine Wo Contrast  05/18/2014   CLINICAL DATA:  Unwitnessed fall. Tripped on this side walk when exiting [employer]. Trauma to the face by striking a parked car.  EXAM: CT HEAD WITHOUT CONTRAST  CT CERVICAL SPINE WITHOUT CONTRAST  TECHNIQUE: Multidetector CT imaging of the head and cervical spine was performed following the standard protocol without intravenous contrast. Multiplanar CT image reconstructions of the cervical spine were also generated.  COMPARISON:  07/25/2012  FINDINGS: CT HEAD FINDINGS  The brain has a normal appearance without evidence of atrophy, infarction, mass lesion, hemorrhage, hydrocephalus or extra-axial collection. The calvarium is unremarkable. The paranasal sinuses, middle ears and  mastoids are clear. The patient has a tendency towards dural calcification, a normal variant  CT CERVICAL SPINE FINDINGS  No fracture or traumatic malalignment. There is chronic fusion at C5-6. There is adjacent segment degenerative spondylosis at C4-5 and C6-7 with endplate osteophytes. No soft tissue swelling.  IMPRESSION: Head CT:  Normal study.  Cervical spine CT: No acute or traumatic finding. Chronic fusion at C5-6 with degenerative spondylosis at C4-5 and C6-7.   Electronically Signed   By: Nelson Chimes M.D.   On: 05/18/2014 21:32   Dg Toe Great Left  05/18/2014   CLINICAL DATA:  Tripped over a rug at [employer]. Pain in the great toe.  EXAM: LEFT GREAT TOE  COMPARISON:  None.  FINDINGS: No evidence of fracture, dislocation or significant degenerative change.  IMPRESSION: Negative.   Electronically Signed   By: Nelson Chimes M.D.   On: 05/18/2014 21:07     EKG Interpretation None      MDM   Final diagnoses:  Fall  Bilateral low back pain without sciatica   Sarah [name] presents after fall with unknown loss of consciousness and midline tenderness to C-spine, upper T-spine and entire L-spine on exam. Patient arrived via EMS fully immobilized. Patient removed from the long spine board but remains flat on the bed with c-collar in place. Gait testing deferred.  10:21PM Patient without signs of serious head, neck, or back injury on exam. No TTP of the chest or abd.   Normal neurological exam. Normal muscle soreness after fall.    Radiology without acute abnormality.  Patient ambulates here in the emergency department without difficulty. c-collar removed and patient with full range of motion without significant pain. No evidence of ligamentous injury.  Pt will be discharged home with symptomatic therapy. Pt has been instructed to follow up with their doctor if symptoms persist. Home conservative therapies for pain including ice and heat tx have been discussed. Pt is  hemodynamically stable, in NAD. Pain has been managed & has no complaints prior to dc.  I have personally reviewed patient's vitals, nursing note and any pertinent labs or imaging.  I performed an undressed physical exam.    It has been determined that no acute conditions requiring further emergency intervention are present at this time. The patient/guardian  have been advised of the diagnosis and plan. I reviewed all labs and imaging including any potential incidental findings. We have discussed signs and symptoms that warrant return to the ED and they are listed in the discharge instructions.    Vital signs are stable at discharge.   BP 124/70 mmHg  Pulse 70  Temp(Src) 98.3 F (36.8 C) (Oral)  Resp 20  SpO2 100%         Abigail Butts, PA-C 05/19/14 [id number]  Orpah Greek, MD 05/19/14 [phone]

## 2014-05-18 NOTE — ED Notes (Signed)
Per EMS, patient had an unwitnessed fall.  She reports she tripped on sidewalk when exiting [facility], hitting her face on parked car.  No LOC.  She then ambulated to her car, approximately 12 feet away, and called 911.

## 2014-05-18 NOTE — Discharge Instructions (Signed)
1. Medications: valium, naproxyn, vicodin, usual home medications 2. Treatment: rest, drink plenty of fluids, gentle stretching as discussed, alternate ice and heat 3. Follow Up: Please followup with your primary doctor in 3 days for discussion of your diagnoses and further evaluation after today's visit; if you do not have a primary care doctor use the resource guide provided to find one;  Return to the ER for worsening back pain, difficulty walking, loss of bowel or bladder control or other concerning symptoms   Back Exercises Back exercises help treat and prevent back injuries. The goal of back exercises is to increase the strength of your abdominal and back muscles and the flexibility of your back. These exercises should be started when you no longer have back pain. Back exercises include:  Pelvic Tilt. Lie on your back with your knees bent. Tilt your pelvis until the lower part of your back is against the floor. Hold this position 5 to 10 sec and repeat 5 to 10 times.  Knee to Chest. Pull first 1 knee up against your chest and hold for 20 to 30 seconds, repeat this with the other knee, and then both knees. This may be done with the other leg straight or bent, whichever feels better.  Sit-Ups or Curl-Ups. Bend your knees 90 degrees. Start with tilting your pelvis, and do a partial, slow sit-up, lifting your trunk only 30 to 45 degrees off the floor. Take at least 2 to 3 seconds for each sit-up. Do not do sit-ups with your knees out straight. If partial sit-ups are difficult, simply do the above but with only tightening your abdominal muscles and holding it as directed.  Hip-Lift. Lie on your back with your knees flexed 90 degrees. Push down with your feet and shoulders as you raise your hips a couple inches off the floor; hold for 10 seconds, repeat 5 to 10 times.  Back arches. Lie on your stomach, propping yourself up on bent elbows. Slowly press on your hands, causing an arch in your low back.  Repeat 3 to 5 times. Any initial stiffness and discomfort should lessen with repetition over time.  Shoulder-Lifts. Lie face down with arms beside your body. Keep hips and torso pressed to floor as you slowly lift your head and shoulders off the floor. Do not overdo your exercises, especially in the beginning. Exercises may cause you some mild back discomfort which lasts for a few minutes; however, if the pain is more severe, or lasts for more than 15 minutes, do not continue exercises until you see your caregiver. Improvement with exercise therapy for back problems is slow.  See your caregivers for assistance with developing a proper back exercise program. Document Released: 08/07/2004 Document Revised: 09/22/2011 Document Reviewed: 05/01/2011 [facility] Patient Information 2015 [place], [place]. This information is not intended to replace advice given to you by your health care provider. Make sure you discuss any questions you have with your health care provider.

## 2014-05-25 ENCOUNTER — Ambulatory Visit (INDEPENDENT_AMBULATORY_CARE_PROVIDER_SITE_OTHER): Payer: Medicare Other | Admitting: *Deleted

## 2014-05-25 ENCOUNTER — Ambulatory Visit (INDEPENDENT_AMBULATORY_CARE_PROVIDER_SITE_OTHER): Payer: Medicare Other | Admitting: Internal Medicine

## 2014-05-25 ENCOUNTER — Encounter: Payer: Self-pay | Admitting: Internal Medicine

## 2014-05-25 VITALS — BP 131/57 | HR 61 | Temp 97.5°F | Ht 66.0 in | Wt 225.0 lb

## 2014-05-25 DIAGNOSIS — Z23 Encounter for immunization: Secondary | ICD-10-CM

## 2014-05-25 DIAGNOSIS — Z0000 Encounter for general adult medical examination without abnormal findings: Secondary | ICD-10-CM

## 2014-05-25 DIAGNOSIS — I10 Essential (primary) hypertension: Secondary | ICD-10-CM

## 2014-05-25 DIAGNOSIS — F411 Generalized anxiety disorder: Secondary | ICD-10-CM

## 2014-05-25 DIAGNOSIS — G8929 Other chronic pain: Secondary | ICD-10-CM

## 2014-05-25 MED ORDER — DIAZEPAM 5 MG PO TABS: ORAL_TABLET | ORAL | Status: DC

## 2014-05-25 MED ORDER — MELOXICAM 7.5 MG PO TABS: 7.5000 mg | ORAL_TABLET | Freq: Every day | ORAL | Status: DC

## 2014-05-25 NOTE — Patient Instructions (Signed)
It was a pleasure to see you today. We have given you a prescription for a new pain pill called meloxicam (mobic) to be taken once a day. Please let us know if this is not helpful. Please return to clinic or seek medical attention if you have any new or worsening joint pain, trouble breathing, or other worrisome medical condition. We look forward to seeing you again in 2-3 weeks.  Lottie Mussel, MD  General Instructions:   Please bring your medicines with you each time you come to clinic.  Medicines may include prescription medications, over-the-counter medications, herbal remedies, eye drops, vitamins, or other pills.   Progress Toward Treatment Goals:  Treatment Goal 04/20/2013  Blood pressure at goal    Self Care Goals & Plans:  Self Care Goal 05/25/2014  Manage my medications take my medicines as prescribed; bring my medications to every visit; refill my medications on time  Monitor my health -  Eat healthy foods eat more vegetables; eat baked foods instead of fried foods; eat foods that are low in salt  Be physically active -    No flowsheet data found.   Care Management & Community Referrals:  No flowsheet data found.

## 2014-05-25 NOTE — Assessment & Plan Note (Signed)
Flu shot administered today. 

## 2014-05-25 NOTE — Progress Notes (Signed)
   Subjective:    Patient ID: Scot Dock, female    DOB: [birth year], 68 y.o.   MRN: [id number]  HPI  Ms Figge is a 68 year old woman with HTN, anxiety who presents for ED follow-up. She was seen in the ED 11/5 for a fall. Per the EMR, she tripped on a rug coming out of a restaurant and hit her head on a truck; she denies loss of consciousness. She denies any prodromal symptoms. She reported back pain as well as L great toe pain. On exam, she had midline tenderness of her c-spine, upper t-spine, and entire l-spine. CT head was normal and CT c-spine had no acute or traumatic findings but noted chronic fusion at C5-6 with degenerative spondylosis at C4-5 and C6-7. She also had films of the l-spine, t-spine and L great toe which were negative. She was discharged with vicodin 5-325 x 6 pills and naproxen 500 mg bid x 30 pills.  Since her visit to the ED, she is still sore in both legs and her left shoulder. She says her L great toe hurts so bad it feels like it is broken. The pain is about the same as it was a week ago. She says the vicodin and naproxen helped equally.    Review of Systems  Constitutional: Negative for fever, chills and diaphoresis.  Respiratory: Negative for shortness of breath.   Cardiovascular: Negative for chest pain.  Gastrointestinal: Negative for nausea, vomiting, abdominal pain, diarrhea and constipation.  Musculoskeletal: Positive for back pain, arthralgias and neck pain.  Neurological: Negative for dizziness, syncope, weakness, light-headedness, numbness and headaches.       Objective:   Physical Exam        Assessment & Plan:

## 2014-05-25 NOTE — Assessment & Plan Note (Signed)
Sarah [name] was very tearful when discussing the unexpected death of her 67 year old son in 02/2014. She said she will never get over it and it still upsets her. She says she has had trouble sleeping since her fall as she has fall dreams. She was prescribed valium 5 mg bid in 12/2013. We discussed how this increases her risk of fall but would not want to abruptly discontinue a benzodiazepine. We recommend she see her PCP who assesses whether she can be weaned off the valium and consider increasing the zoloft. -continue sertraline 100 mg qhs -continue valim 5 mg bid x 60 pills for now

## 2014-05-25 NOTE — Assessment & Plan Note (Signed)
BP Readings from Last 3 Encounters:  05/25/14 131/57  05/18/14 124/70  03/27/14 124/58    Lab Results  Component Value Date   NA 140 09/09/2012   K 4.6 09/09/2012   CREATININE 0.92 09/09/2012    Assessment: Blood pressure control: controlled Progress toward BP goal:  at goal Comments: none  Plan: Medications:  continue current medications amlodipine 5 mg daily and HCTZ 12.5 mg daily Educational resources provided:   Self management tools provided:   Other plans: none, continue to monitor

## 2014-05-25 NOTE — Assessment & Plan Note (Signed)
See HPI. Sarah [name] reports diffuse pain down her neck, back, legs, and L foot. This is likely a generalized, diffuse arthralgia. She had negative c-spine CT, as well as plain films of l-spine, t-spine and L great toe; thus, additional imaging is not warranted at this time. She would likely not benefit from sports medicine referral as the pain is diffuse. Fibromyalgia was considered but unlikely as the patient says the pain began acutely after a mechanical fall, is generally greater on the left versus right, and trigger point exam was equivocal. She already takes gabapentin 100 mg tid for her chronic L leg pain from her previous fracture and thinks this is somewhat helpful. As she still has the pain after a trial of vicodin and naproxen, we will recommend a different class of anti-inflammatory medications that are found on the $4 Rx list. -mobic 7.5 mg po daily -reassess in 2-3 weeks and consider diclofenac ($4 list) at that time if no improvement

## 2014-05-26 NOTE — Progress Notes (Signed)
I saw and evaluated the patient. I personally confirmed the key portions of Dr. Rothman's history and exam and reviewed pertinent patient test results. The assessment, diagnosis, and plan were formulated together and I agree with the documentation in the resident's note. 

## 2014-06-06 ENCOUNTER — Other Ambulatory Visit: Payer: Self-pay | Admitting: *Deleted

## 2014-06-06 MED ORDER — HYDROCHLOROTHIAZIDE 12.5 MG PO TABS: 12.5000 mg | ORAL_TABLET | Freq: Every day | ORAL | Status: DC

## 2014-06-13 ENCOUNTER — Encounter: Payer: Self-pay | Admitting: Internal Medicine

## 2014-06-13 ENCOUNTER — Ambulatory Visit (INDEPENDENT_AMBULATORY_CARE_PROVIDER_SITE_OTHER): Payer: Self-pay | Admitting: Internal Medicine

## 2014-06-13 ENCOUNTER — Ambulatory Visit (HOSPITAL_COMMUNITY)
Admission: RE | Admit: 2014-06-13 | Discharge: 2014-06-13 | Disposition: A | Discharge status: Home / Self Care | Payer: Medicare Other | Source: Ambulatory Visit | Attending: Internal Medicine | Admitting: Internal Medicine

## 2014-06-13 VITALS — BP 128/58 | HR 76 | Temp 98.2°F | Ht 67.0 in | Wt 222.5 lb

## 2014-06-13 DIAGNOSIS — M545 Low back pain: Secondary | ICD-10-CM

## 2014-06-13 DIAGNOSIS — M25512 Pain in left shoulder: Secondary | ICD-10-CM | POA: Insufficient documentation

## 2014-06-13 DIAGNOSIS — I10 Essential (primary) hypertension: Secondary | ICD-10-CM

## 2014-06-13 DIAGNOSIS — G8929 Other chronic pain: Secondary | ICD-10-CM | POA: Insufficient documentation

## 2014-06-13 DIAGNOSIS — Z0000 Encounter for general adult medical examination without abnormal findings: Secondary | ICD-10-CM

## 2014-06-13 MED ORDER — MELOXICAM 15 MG PO TABS: 15.0000 mg | ORAL_TABLET | Freq: Every day | ORAL | Status: DC

## 2014-06-13 MED ORDER — HYDROCODONE-ACETAMINOPHEN 5-325 MG PO TABS: 1.0000 | ORAL_TABLET | Freq: Four times a day (QID) | ORAL | Status: DC | PRN

## 2014-06-13 NOTE — Assessment & Plan Note (Signed)
Pain w/ palpation over the tip of the shoulder and anterior tenderness. Mild limitation in active ROM w/ abduction. Some question of rotator cuff tear 2/2 fall recently.  -XR left shoulder; shows degenerative change particularly involving the insertion of the left rotator cuff indicating impingement and probable chronic rotator cuff disease. -Increased Mobic to 15 mg daily w/ Vicodin 5-325 mg for breakthrough; gave refill of #30.  -Will have patient follow up in 1 month. If pain continues w/ conservative therapy, can refer for sports medicine, consider steroid injection, or orthopedics.

## 2014-06-13 NOTE — Assessment & Plan Note (Signed)
BP Readings from Last 3 Encounters:  06/13/14 128/58  05/25/14 131/57  05/18/14 124/70    Lab Results  Component Value Date   NA 140 09/09/2012   K 4.6 09/09/2012   CREATININE 0.92 09/09/2012    Assessment: Blood pressure control: controlled Progress toward BP goal:  at goal Comments: Compliant w/ HCTZ 12.5 mg daily + Norvasc 2.5 mg daily.   Plan: Medications:  continue current medications Educational resources provided: brochure (has information ) Self management tools provided:   Other plans: RTC in 1 month

## 2014-06-13 NOTE — Progress Notes (Signed)
Internal Medicine Clinic Attending  I saw and evaluated the patient.  I personally confirmed the key portions of the history and exam documented by Dr. Jones and I reviewed pertinent patient test results.  The assessment, diagnosis, and plan were formulated together and I agree with the documentation in the resident's note.     

## 2014-06-13 NOTE — Patient Instructions (Addendum)
General Instructions:  1. Please schedule a follow up appointment for 1 month.  Get XR right shoulder upstairs in radiology.  2. Please take all medications as prescribed.   Start taking Mobic 15 mg daily. Can take Vicodin 5-325 mg twice daily ONLY as needed for breakthrough pain.   3. If you have worsening of your symptoms or new symptoms arise, please call the clinic ([phone]), or go to the ER immediately if symptoms are severe.    Thank you for bringing your medicines today. This helps [place] keep you safe from mistakes.   Progress Toward Treatment Goals:  Treatment Goal 06/13/2014  Blood pressure at goal  Prevent falls at goal    Self Care Goals & Plans:  Self Care Goal 06/13/2014  Manage my medications take my medicines as prescribed; bring my medications to every visit; refill my medications on time  Monitor my health -  Eat healthy foods drink diet soda or water instead of juice or soda; eat more vegetables; eat foods that are low in salt; eat baked foods instead of fried foods; eat fruit for snacks and desserts  Be physically active -  Meeting treatment goals maintain the current self-care plan    No flowsheet data found.   Care Management & Community Referrals:  Referral 06/13/2014  Referrals made for care management support social worker  Referrals made to community resources none

## 2014-06-13 NOTE — Progress Notes (Signed)
Subjective:   Patient ID: Sarah [name] female   DOB: 06/15/1946 68 y.o.   MRN: [id number]  HPI: [name] is a 68 y.o. female w/ PMHx of HTN, HLD, anxiety, and obesity, presents to the clinic today for a follow-up visit. Patient was last seen in the clinic on 05/25/14 after she sustained a fall and had been complaining of diffuse arthralgias 2/2 this. Today, the patient is still having lower back pain, foot pain (L>R), and left shoulder pain that she says is generally unchanged from her previous visit. She walks w/ a cane today, however she has been using this since prior to her recent fall.  The patient is quite tearful on exam today, says she is overwhelmed by certain aspect of her life, most namely having to care for her husband (had a CVA) and also grieving the death of her son who passed away in 02-19-2014. Since her fall, both of these issues have become almost too much to manage on her own and is having a hard time caring for both herself and her husband.   Past Medical History  Diagnosis Date  . Obesity   . Cellulitis of left foot   . Dystrophic nail   . Abnormal electrocardiogram   . Postmenopausal status   . Peripheral edema   . Anxiety   . High cholesterol   . Hypertension    Current Outpatient Prescriptions  Medication Sig Dispense Refill  . amLODipine (NORVASC) 2.5 MG tablet TAKE 1 TABLET BY MOUTH EVERY DAY (Patient taking differently: Take 1 tablet by mouth every day) 30 tablet 3  . atorvastatin (LIPITOR) 10 MG tablet TAKE 1 TABLET BY MOUTH DAILY (Patient taking differently: Take 1 Tablet by mouth daily) 30 tablet 3  . Calcium Carbonate-Vit D-Min (CALCIUM 600+D PLUS MINERALS) 600-400 MG-UNIT CHEW Chew 1 tablet by mouth daily. 30 each 11  . diazepam (VALIUM) 5 MG tablet Take 1 tablet twice daily. 60 tablet 0  . gabapentin (NEURONTIN) 100 MG capsule TAKE ONE CAPSULE BY MOUTH THREE TIMES DAILY (Patient taking differently: Take 1 capsule by mouth three times daily) 90  capsule 2  . hydrochlorothiazide (HYDRODIURIL) 12.5 MG tablet Take 1 tablet (12.5 mg total) by mouth daily. 90 tablet 4  . HYDROcodone-acetaminophen (NORCO/VICODIN) 5-325 MG per tablet Take 1 tablet by mouth every 6 (six) hours as needed for moderate pain or severe pain. 30 tablet 0  . meloxicam (MOBIC) 15 MG tablet Take 1 tablet (15 mg total) by mouth daily. 30 tablet 1  . psyllium (METAMUCIL SMOOTH TEXTURE) 28 % packet Take 1 packet in an 8 oz glass of water twice a day as needed for constipation.  Do not take within 2 hours of taking other medications. 30 packet 2  . sertraline (ZOLOFT) 100 MG tablet TAKE 1 TABLET BY MOUTH EVERY DAY (Patient taking differently: Take 1 tablet by mouth every day) 30 tablet 2   No current facility-administered medications for this visit.    Review of Systems: General: Denies fever, chills, diaphoresis, appetite change and fatigue.  Respiratory: Denies SOB, DOE, cough, and wheezing.   Cardiovascular: Denies chest pain and palpitations.  Gastrointestinal: Denies nausea, vomiting, abdominal pain, and diarrhea.  Genitourinary: Denies dysuria, increased frequency, and flank pain. Endocrine: Denies hot or cold intolerance, polyuria, and polydipsia. Musculoskeletal: Positive for myalgias, back pain, arthralgias, and gait difficulty. Denies joint swelling. Skin: Denies pallor, rash and wounds.  Neurological: Denies dizziness, seizures, syncope, weakness, lightheadedness, numbness and headaches.  Psychiatric/Behavioral:  Denies mood changes, and sleep disturbances.  Objective:   Physical Exam: Filed Vitals:   06/13/14 0904  BP: 128/58  Pulse: 76  Temp: 98.2 F (36.8 C)  TempSrc: Oral  Height: 5\' 7"  (1.702 m)  Weight: 222 lb 8 oz (100.925 kg)  SpO2: 100%    General: AA female, alert, cooperative, tearful on exam.  HEENT: PERRL, EOMI. Moist mucus membranes Neck: Full range of motion without pain, supple, no lymphadenopathy or carotid bruits Lungs: Clear  to ascultation bilaterally, normal work of respiration, no wheezes, rales, rhonchi Heart: RRR, no murmurs, gallops, or rubs Abdomen: Soft, non-tender, non-distended, BS + Extremities: No cyanosis, clubbing, or edema Musculoskeletal: Tenderness to palpation in the lumbar paraspinal region. Tenderness over the left tip of the shoulder. Only slightly limited ROM on abduction (past 90 degrees) of the left arm. Walks with a cane.  Neurologic: Alert & oriented X3, cranial nerves II-XII intact, strength grossly intact, sensation intact to light touch   Assessment & Plan:   Please see problem based assessment and plan.

## 2014-06-13 NOTE — Assessment & Plan Note (Signed)
Patient w/ continued pain mostly in lumbar spine and left shoulder. Suspect paraspinal muscle strain. Previous imaging of cervical, thoracic, and lumbar spine not significant for fracture. Pain poorly controlled w/ Mobic 7.5 mg qd. -Increase Mobic to 15 mg daily + Vicodin 5-325 mg only for breakthrough.  -Will have patient return in 1 month. Consider further imaging and workup at that time if pain continues.  -Encouraged heat pack while at rest.

## 2014-06-15 ENCOUNTER — Other Ambulatory Visit: Payer: Self-pay | Admitting: Internal Medicine

## 2014-06-24 ENCOUNTER — Other Ambulatory Visit: Payer: Self-pay | Admitting: Internal Medicine

## 2014-07-05 ENCOUNTER — Other Ambulatory Visit: Payer: Self-pay | Admitting: Internal Medicine

## 2014-07-17 ENCOUNTER — Other Ambulatory Visit: Payer: Self-pay | Admitting: Internal Medicine

## 2014-07-17 NOTE — Telephone Encounter (Signed)
Aldine Contes, MD is unavailable through 07/25/2014, refill request sent to attending pool.Despina Hidden Cassady1/4/20168:47 AM

## 2014-07-17 NOTE — Telephone Encounter (Signed)
Needs appt Jan to F/U pain issues

## 2014-07-18 NOTE — Telephone Encounter (Signed)
appt request sent to front desk pool for an appointment.Regenia Skeeter, Satcha Storlie Cassady1/5/20169:21 AM

## 2014-07-26 ENCOUNTER — Ambulatory Visit (INDEPENDENT_AMBULATORY_CARE_PROVIDER_SITE_OTHER): Payer: Medicare Other | Admitting: Internal Medicine

## 2014-07-26 VITALS — BP 139/68 | HR 71 | Temp 97.2°F | Wt 227.5 lb

## 2014-07-26 DIAGNOSIS — M545 Low back pain: Secondary | ICD-10-CM

## 2014-07-26 DIAGNOSIS — M79605 Pain in left leg: Secondary | ICD-10-CM

## 2014-07-26 DIAGNOSIS — F411 Generalized anxiety disorder: Secondary | ICD-10-CM

## 2014-07-26 DIAGNOSIS — G8929 Other chronic pain: Secondary | ICD-10-CM

## 2014-07-26 MED ORDER — METHOCARBAMOL 750 MG PO TABS: 750.0000 mg | ORAL_TABLET | Freq: Four times a day (QID) | ORAL | Status: AC

## 2014-07-26 MED ORDER — NAPROXEN 500 MG PO TABS: 500.0000 mg | ORAL_TABLET | Freq: Two times a day (BID) | ORAL | Status: DC

## 2014-07-26 NOTE — Patient Instructions (Signed)
General Instructions:  Please stop taking Meloxicam, Hydrocodone-acetaminophen, and Diazepam.  I want you to start taking Naproxen 500mg  twice a day  You can take Methocarbamol 750mg  up to 4 times a day for muscle spasm.  Do not take this for more than 2 weeks.  Follow up in 1 month.  Please bring your medicines with you each time you come to clinic.  Medicines may include prescription medications, over-the-counter medications, herbal remedies, eye drops, vitamins, or other pills.   Progress Toward Treatment Goals:  Treatment Goal 06/13/2014  Blood pressure at goal  Prevent falls at goal    Self Care Goals & Plans:  Self Care Goal 06/13/2014  Manage my medications take my medicines as prescribed; bring my medications to every visit; refill my medications on time  Monitor my health -  Eat healthy foods drink diet soda or water instead of juice or soda; eat more vegetables; eat foods that are low in salt; eat baked foods instead of fried foods; eat fruit for snacks and desserts  Be physically active -  Meeting treatment goals maintain the current self-care plan    No flowsheet data found.   Care Management & Community Referrals:  Referral 06/13/2014  Referrals made for care management support social worker  Referrals made to community resources none
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
## 2014-07-27 NOTE — Progress Notes (Signed)
INTERNAL MEDICINE TEACHING ATTENDING ADDENDUM - Ebb Carelock, MD: I reviewed and discussed at the time of visit with the resident Dr. Hoffman, the patient's medical history, physical examination, diagnosis and results of pertinent tests and treatment and I agree with the patient's care as documented.  
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
## 2014-07-27 NOTE — Assessment & Plan Note (Signed)
Continue Zoloft 

## 2014-08-15 ENCOUNTER — Encounter: Payer: Self-pay | Admitting: *Deleted

## 2014-08-23 ENCOUNTER — Other Ambulatory Visit: Payer: Self-pay | Admitting: *Deleted

## 2014-08-23 MED ORDER — HYDROCHLOROTHIAZIDE 12.5 MG PO TABS: 12.5000 mg | ORAL_TABLET | Freq: Every day | ORAL | Status: DC

## 2014-08-23 MED ORDER — GABAPENTIN 100 MG PO CAPS: 100.0000 mg | ORAL_CAPSULE | Freq: Three times a day (TID) | ORAL | Status: DC

## 2014-08-23 MED ORDER — SERTRALINE HCL 100 MG PO TABS: 100.0000 mg | ORAL_TABLET | Freq: Every day | ORAL | Status: DC

## 2014-08-23 MED ORDER — NAPROXEN 500 MG PO TABS
500.0000 mg | ORAL_TABLET | Freq: Two times a day (BID) | ORAL | Status: DC
Start: 2014-08-23 — End: 2015-06-23

## 2014-08-23 MED ORDER — AMLODIPINE BESYLATE 2.5 MG PO TABS: 2.5000 mg | ORAL_TABLET | Freq: Every day | ORAL | Status: DC

## 2014-08-23 MED ORDER — PSYLLIUM 28 % PO PACK: PACK | ORAL | Status: DC

## 2014-08-23 MED ORDER — ATORVASTATIN CALCIUM 10 MG PO TABS: 10.0000 mg | ORAL_TABLET | Freq: Every day | ORAL | Status: DC

## 2014-08-23 NOTE — Telephone Encounter (Signed)
appt scheduled for 2/11.

## 2014-08-23 NOTE — Telephone Encounter (Signed)
Pt is changing pharmacies to mail order per her insurance, they must have 90 day scripts, she states she has been using 1 diazepam daily due to the death of her son

## 2014-08-24 ENCOUNTER — Ambulatory Visit (INDEPENDENT_AMBULATORY_CARE_PROVIDER_SITE_OTHER): Payer: Medicare Other | Admitting: Internal Medicine

## 2014-08-24 ENCOUNTER — Encounter: Payer: Self-pay | Admitting: Internal Medicine

## 2014-08-24 ENCOUNTER — Ambulatory Visit (HOSPITAL_COMMUNITY)
Admission: RE | Admit: 2014-08-24 | Discharge: 2014-08-24 | Disposition: A | Discharge status: Home / Self Care | Payer: Medicare Other | Source: Ambulatory Visit | Attending: Internal Medicine | Admitting: Internal Medicine

## 2014-08-24 VITALS — BP 134/49 | HR 85 | Temp 98.4°F | Wt 230.5 lb

## 2014-08-24 DIAGNOSIS — M1712 Unilateral primary osteoarthritis, left knee: Secondary | ICD-10-CM | POA: Diagnosis not present

## 2014-08-24 DIAGNOSIS — F411 Generalized anxiety disorder: Secondary | ICD-10-CM

## 2014-08-24 DIAGNOSIS — M898X6 Other specified disorders of bone, lower leg: Secondary | ICD-10-CM | POA: Insufficient documentation

## 2014-08-24 DIAGNOSIS — M25562 Pain in left knee: Secondary | ICD-10-CM | POA: Insufficient documentation

## 2014-08-24 DIAGNOSIS — M79605 Pain in left leg: Secondary | ICD-10-CM

## 2014-08-24 DIAGNOSIS — R262 Difficulty in walking, not elsewhere classified: Secondary | ICD-10-CM

## 2014-08-24 DIAGNOSIS — R600 Localized edema: Secondary | ICD-10-CM

## 2014-08-24 DIAGNOSIS — M21862 Other specified acquired deformities of left lower leg: Secondary | ICD-10-CM | POA: Insufficient documentation

## 2014-08-24 DIAGNOSIS — W19XXXD Unspecified fall, subsequent encounter: Secondary | ICD-10-CM

## 2014-08-24 MED ORDER — DIAZEPAM 5 MG PO TABS: 5.0000 mg | ORAL_TABLET | Freq: Two times a day (BID) | ORAL | Status: DC | PRN

## 2014-08-24 NOTE — Patient Instructions (Signed)
- It was a pleasure seeing you today - You will need to follow up in 1 week for a regular appointment and blood work - I will get an xray of your left knee today - We will also refer you to sports medicine for your persistent knee pain  Knee Pain The knee is the complex joint between your thigh and your lower leg. It is made up of bones, tendons, ligaments, and cartilage. The bones that make up the knee are:  The femur in the thigh.  The tibia and fibula in the lower leg.  The patella or kneecap riding in the groove on the lower femur. CAUSES  Knee pain is a common complaint with many causes. A few of these causes are:  Injury, such as:  A ruptured ligament or tendon injury.  Torn cartilage.  Medical conditions, such as:  Gout  Arthritis  Infections  Overuse, over training, or overdoing a physical activity. Knee pain can be minor or severe. Knee pain can accompany debilitating injury. Minor knee problems often respond well to self-care measures or get well on their own. More serious injuries may need medical intervention or even surgery. SYMPTOMS The knee is complex. Symptoms of knee problems can vary widely. Some of the problems are:  Pain with movement and weight bearing.  Swelling and tenderness.  Buckling of the knee.  Inability to straighten or extend your knee.  Your knee locks and you cannot straighten it.  Warmth and redness with pain and fever.  Deformity or dislocation of the kneecap. DIAGNOSIS  Determining what is wrong may be very straight forward such as when there is an injury. It can also be challenging because of the complexity of the knee. Tests to make a diagnosis may include:  Your caregiver taking a history and doing a physical exam.  Routine X-rays can be used to rule out other problems. X-rays will not reveal a cartilage tear. Some injuries of the knee can be diagnosed by:  Arthroscopy a surgical technique by which a small video camera is  inserted through tiny incisions on the sides of the knee. This procedure is used to examine and repair internal knee joint problems. Tiny instruments can be used during arthroscopy to repair the torn knee cartilage (meniscus).  Arthrography is a radiology technique. A contrast liquid is directly injected into the knee joint. Internal structures of the knee joint then become visible on X-ray film.  An MRI scan is a non X-ray radiology procedure in which magnetic fields and a computer produce two- or three-dimensional images of the inside of the knee. Cartilage tears are often visible using an MRI scanner. MRI scans have largely replaced arthrography in diagnosing cartilage tears of the knee.  Blood work.  Examination of the fluid that helps to lubricate the knee joint (synovial fluid). This is done by taking a sample out using a needle and a syringe. TREATMENT The treatment of knee problems depends on the cause. Some of these treatments are:  Depending on the injury, proper casting, splinting, surgery, or physical therapy care will be needed.  Give yourself adequate recovery time. Do not overuse your joints. If you begin to get sore during workout routines, back off. Slow down or do fewer repetitions.  For repetitive activities such as cycling or running, maintain your strength and nutrition.  Alternate muscle groups. For example, if you are a weight lifter, work the upper body on one day and the lower body the next.  Either tight  or weak muscles do not give the proper support for your knee. Tight or weak muscles do not absorb the stress placed on the knee joint. Keep the muscles surrounding the knee strong.  Take care of mechanical problems.  If you have flat feet, orthotics or special shoes may help. See your caregiver if you need help.  Arch supports, sometimes with wedges on the inner or outer aspect of the heel, can help. These can shift pressure away from the side of the knee most  bothered by osteoarthritis.  A brace called an "unloader" brace also may be used to help ease the pressure on the most arthritic side of the knee.  If your caregiver has prescribed crutches, braces, wraps or ice, use as directed. The acronym for this is PRICE. This means protection, rest, ice, compression, and elevation.  Nonsteroidal anti-inflammatory drugs (NSAIDs), can help relieve pain. But if taken immediately after an injury, they may actually increase swelling. Take NSAIDs with food in your stomach. Stop them if you develop stomach problems. Do not take these if you have a history of ulcers, stomach pain, or bleeding from the bowel. Do not take without your caregiver's approval if you have problems with fluid retention, heart failure, or kidney problems.  For ongoing knee problems, physical therapy may be helpful.  Glucosamine and chondroitin are over-the-counter dietary supplements. Both may help relieve the pain of osteoarthritis in the knee. These medicines are different from the usual anti-inflammatory drugs. Glucosamine may decrease the rate of cartilage destruction.  Injections of a corticosteroid drug into your knee joint may help reduce the symptoms of an arthritis flare-up. They may provide pain relief that lasts a few months. You may have to wait a few months between injections. The injections do have a small increased risk of infection, water retention, and elevated blood sugar levels.  Hyaluronic acid injected into damaged joints may ease pain and provide lubrication. These injections may work by reducing inflammation. A series of shots may give relief for as long as 6 months.  Topical painkillers. Applying certain ointments to your skin may help relieve the pain and stiffness of osteoarthritis. Ask your pharmacist for suggestions. Many over the-counter products are approved for temporary relief of arthritis pain.  In some countries, doctors often prescribe topical NSAIDs for  relief of chronic conditions such as arthritis and tendinitis. A review of treatment with NSAID creams found that they worked as well as oral medications but without the serious side effects. PREVENTION  Maintain a healthy weight. Extra pounds put more strain on your joints.  Get strong, stay limber. Weak muscles are a common cause of knee injuries. Stretching is important. Include flexibility exercises in your workouts.  Be smart about exercise. If you have osteoarthritis, chronic knee pain or recurring injuries, you may need to change the way you exercise. This does not mean you have to stop being active. If your knees ache after jogging or playing basketball, consider switching to swimming, water aerobics, or other low-impact activities, at least for a few days a week. Sometimes limiting high-impact activities will provide relief.  Make sure your shoes fit well. Choose footwear that is right for your sport.  Protect your knees. Use the proper gear for knee-sensitive activities. Use kneepads when playing volleyball or laying carpet. Buckle your seat belt every time you drive. Most shattered kneecaps occur in car accidents.  Rest when you are tired. SEEK MEDICAL CARE IF:  You have knee pain that is continual and  does not seem to be getting better.  SEEK IMMEDIATE MEDICAL CARE IF:  Your knee joint feels hot to the touch and you have a high fever. MAKE SURE YOU:   Understand these instructions.  Will watch your condition.  Will get help right away if you are not doing well or get worse. Document Released: 04/27/2007 Document Revised: 09/22/2011 Document Reviewed: 04/27/2007 [facility] Patient Information 2015 [place], [place]. This information is not intended to replace advice given to you by your health care provider. Make sure you discuss any questions you have with your health care provider.

## 2014-08-24 NOTE — Assessment & Plan Note (Signed)
-   c/w zoloft for now - diazepam refilled. Explained will try only short course - Pt to f/u with psychotherapist

## 2014-08-24 NOTE — Progress Notes (Signed)
   Subjective:    Patient ID: Sarah [name], female    DOB: [birth year], 69 y.o.   MRN: [id number]  HPI Pt presents for persistent left knee pain and mild swelling. She states the recurrent pain and swelling have been there since her fall at [facility] on November the 5th. On her last visit vicodin and Mobic were dc'd and she was started on Robaxin and naproxen. She states they help a little but pain is constant. No fevers. Patient complains of difficulty ambulating secondary to pain.  She also has increased anxiety and wants to continue taking diazepam prn.  She states that this visit is only for her knee pain and she wants to address other issues including lab work and DEXA scan and fatigue (need to check TSH and CBC) on a subsequent visit.   Review of Systems  Constitutional: Positive for fatigue.  HENT: Negative.   Eyes: Negative.   Respiratory: Negative.   Cardiovascular: Negative.   Gastrointestinal: Negative.   Genitourinary: Negative.   Musculoskeletal: Positive for arthralgias and gait problem.  Skin: Negative.   Neurological: Negative.   Psychiatric/Behavioral: Positive for dysphoric mood. The patient is nervous/anxious.        Objective:   Physical Exam  Constitutional: She is oriented to person, place, and time. She appears well-developed and well-nourished.  HENT:  Head: Normocephalic and atraumatic.  Eyes: Conjunctivae are normal.  Neck: Normal range of motion.  Cardiovascular: Normal rate and regular rhythm.   Pulmonary/Chest: Effort normal and breath sounds normal. No respiratory distress. She has no wheezes. She has no rales.  Abdominal: Soft. Bowel sounds are normal. She exhibits no distension. There is no tenderness.  Musculoskeletal:  Mild left knee swelling and tenderness to palpation Patient with pain on flexion of her knee  Neurological: She is alert and oriented to person, place, and time.  Skin: Skin is warm and dry.          Assessment & Plan:    Please see problem based charting for assessment and plan: Will address chronic issues, lab work and fatigue on next visit per patient request

## 2014-08-24 NOTE — Assessment & Plan Note (Signed)
-   Pt with worsening Left knee pain since fall and now with difficulty ambulating and mild swelling - L knee x ray with mild degenerative changes - c/w naproxyn for now - Will refer to sports medicine for follow up

## 2014-08-30 ENCOUNTER — Telehealth: Payer: Self-pay | Admitting: Internal Medicine

## 2014-08-30 NOTE — Telephone Encounter (Signed)
Call to patient to confirm appointment for 08/31/14 at 3:15 lmtcb

## 2014-08-31 ENCOUNTER — Ambulatory Visit (INDEPENDENT_AMBULATORY_CARE_PROVIDER_SITE_OTHER): Payer: Medicare Other | Admitting: Internal Medicine

## 2014-08-31 VITALS — BP 109/51 | HR 68 | Temp 98.3°F | Ht 67.0 in | Wt 231.2 lb

## 2014-08-31 DIAGNOSIS — M25462 Effusion, left knee: Secondary | ICD-10-CM | POA: Diagnosis not present

## 2014-08-31 DIAGNOSIS — I10 Essential (primary) hypertension: Secondary | ICD-10-CM

## 2014-08-31 DIAGNOSIS — F419 Anxiety disorder, unspecified: Secondary | ICD-10-CM

## 2014-08-31 DIAGNOSIS — M25562 Pain in left knee: Secondary | ICD-10-CM

## 2014-08-31 DIAGNOSIS — E785 Hyperlipidemia, unspecified: Secondary | ICD-10-CM

## 2014-08-31 DIAGNOSIS — Z23 Encounter for immunization: Secondary | ICD-10-CM

## 2014-08-31 DIAGNOSIS — F411 Generalized anxiety disorder: Secondary | ICD-10-CM

## 2014-08-31 DIAGNOSIS — Z0000 Encounter for general adult medical examination without abnormal findings: Secondary | ICD-10-CM

## 2014-08-31 DIAGNOSIS — M79605 Pain in left leg: Secondary | ICD-10-CM

## 2014-08-31 NOTE — Progress Notes (Signed)
Medicine attending: Medical history, presenting problems, physical findings, and medications, reviewed with Dr Luan Moore and I concur with his evaluation and management plan.

## 2014-08-31 NOTE — Assessment & Plan Note (Signed)
- 

## 2014-08-31 NOTE — Assessment & Plan Note (Signed)
Patient states that she has persistent left knee pain since last time. She does state that her prescription of naproxen has helped with her pain. Exam is remarkable for mild joint effusion and tenderness. -Patient has scheduled follow-up with sports medicine on 09/06/2014.

## 2014-08-31 NOTE — Patient Instructions (Addendum)
Please follow-up with your sports medicine physician for further management of your left knee pain. Please also follow-up with a psychotherapist to help with your anxiety and depression. Please come back to see [place] in clinic in 3 months. We will also contact you regarding a referral to a psychologist.  General Instructions:   Thank you for bringing your medicines today. This helps [place] keep you safe from mistakes.   Progress Toward Treatment Goals:  Treatment Goal 08/31/2014  Blood pressure at goal  Prevent falls at goal    Self Care Goals & Plans:  Self Care Goal 08/31/2014  Manage my medications take my medicines as prescribed; bring my medications to every visit; refill my medications on time  Monitor my health -  Eat healthy foods drink diet soda or water instead of juice or soda; eat more vegetables; eat foods that are low in salt; eat baked foods instead of fried foods; eat fruit for snacks and desserts  Be physically active -  Meeting treatment goals -    No flowsheet data found.   Care Management & Community Referrals:  Referral 06/13/2014  Referrals made for care management support social worker  Referrals made to community resources none

## 2014-08-31 NOTE — Progress Notes (Signed)
   Subjective:    Patient ID: Sarah [name], female    DOB: April 16, 1946, 69 y.o.   MRN: [id number]  HPI  Patient is a 69 year old with a history of anxiety, hypertension, hyperlipidemia presents to clinic for routine follow-up.   Please refer to separate problem-list charting for more details.   Review of Systems  Constitutional: Negative for fever and chills.  HENT: Negative for rhinorrhea and sore throat.   Eyes: Negative for visual disturbance.  Respiratory: Negative for cough and shortness of breath.   Cardiovascular: Negative for chest pain and palpitations.  Gastrointestinal: Negative for nausea, vomiting, abdominal pain, diarrhea, constipation and blood in stool.  Genitourinary: Negative for dysuria and hematuria.  Musculoskeletal:       Left knee pain  Neurological: Negative for syncope.  Psychiatric/Behavioral:       Decreased energy       Objective:   Physical Exam  Constitutional: She is oriented to person, place, and time. She appears well-developed and well-nourished. No distress.  HENT:  Head: Normocephalic and atraumatic.  Eyes: EOM are normal. Pupils are equal, round, and reactive to light. Left eye exhibits no discharge.  Neck: Normal range of motion. Neck supple. No thyromegaly present.  Cardiovascular: Normal rate and regular rhythm.  Exam reveals no gallop and no friction rub.   No murmur heard. Pulmonary/Chest: Effort normal and breath sounds normal. No respiratory distress. She has no wheezes. She has no rales.  Abdominal: Soft. Bowel sounds are normal. She exhibits no distension. There is no tenderness. There is no rebound.  Musculoskeletal:  Left knee effusion with tenderness to palpation throughout the knee  Neurological: She is alert and oriented to person, place, and time. No cranial nerve deficit.  Skin: Skin is warm and dry. No rash noted.  Psychiatric: She has a normal mood and affect. Thought content normal.          Assessment & Plan:    Please refer to separate problem-list charting for more details.

## 2014-08-31 NOTE — Assessment & Plan Note (Signed)
BP Readings from Last 3 Encounters:  08/31/14 109/51  08/24/14 134/49  07/26/14 139/68    Lab Results  Component Value Date   NA 140 09/09/2012   K 4.6 09/09/2012   CREATININE 0.92 09/09/2012    Assessment: Blood pressure control: controlled Progress toward BP goal:  at goal Comments: Patient states that she is compliant with her amlodipine 2.5 mg daily, hydrochlorothiazide 12.5 mg daily.  Plan: Medications:  continue current medications Educational resources provided: brochure (denies) Self management tools provided:   Other plans: None

## 2014-08-31 NOTE — Assessment & Plan Note (Addendum)
Patient states that she is compliant with her Lipitor 10 mg daily. -Repeat lipid panel today.  Addendum 09/01/14:  Lipid Panel     Component Value Date/Time   CHOL 185 08/31/2014 1628   TRIG 185* 08/31/2014 1628   HDL 45 08/31/2014 1628   CHOLHDL 4.1 08/31/2014 1628   VLDL 37 08/31/2014 1628   LDLCALC 103* 08/31/2014 1628    Patient's ASCVD risk is 2% with the below parameters. No change in statin therapy warranted at this time.

## 2014-08-31 NOTE — Assessment & Plan Note (Signed)
Patient still reports symptoms of fatigue and is currently compliant with her regimen of Zoloft 100 mg daily and diazepam 5 mg twice a day when necessary. -Continue with current medication regimen. Diazepam is planned to be a short course of therapy. -Will contact patient with information regarding referral to psychology.

## 2014-09-01 LAB — BASIC METABOLIC PANEL
BUN: 10 mg/dL (ref 6–23)
CHLORIDE: 103 meq/L (ref 96–112)
CO2: 29 mEq/L (ref 19–32)
Calcium: 9.2 mg/dL (ref 8.4–10.5)
Creat: 0.93 mg/dL (ref 0.50–1.10)
Glucose, Bld: 80 mg/dL (ref 70–99)
POTASSIUM: 4.6 meq/L (ref 3.5–5.3)
Sodium: 140 mEq/L (ref 135–145)

## 2014-09-01 LAB — LIPID PANEL
CHOL/HDL RATIO: 4.1 ratio
CHOLESTEROL: 185 mg/dL (ref 0–200)
HDL: 45 mg/dL (ref >39)
LDL Cholesterol: 103 mg/dL — ABNORMAL HIGH (ref 0–99)
Triglycerides: 185 mg/dL — ABNORMAL HIGH (ref <150)
VLDL: 37 mg/dL (ref 0–40)

## 2014-09-04 ENCOUNTER — Ambulatory Visit: Payer: Medicare Other | Admitting: Sports Medicine

## 2014-09-06 ENCOUNTER — Encounter: Payer: Self-pay | Admitting: Sports Medicine

## 2014-09-06 ENCOUNTER — Ambulatory Visit (INDEPENDENT_AMBULATORY_CARE_PROVIDER_SITE_OTHER): Payer: Medicare Other | Admitting: Sports Medicine

## 2014-09-06 VITALS — BP 116/73 | HR 86 | Ht 67.0 in | Wt 231.0 lb

## 2014-09-06 DIAGNOSIS — G8929 Other chronic pain: Secondary | ICD-10-CM | POA: Diagnosis not present

## 2014-09-07 ENCOUNTER — Telehealth: Payer: Self-pay | Admitting: Licensed Clinical Social Worker

## 2014-09-07 NOTE — Progress Notes (Signed)
   Subjective:    Patient ID: Sarah [name], female    DOB: November 03, 1945, 69 y.o.   MRN: [id number]  HPI chief complaint: Left arm and left knee pain  69 year old female comes in today complaining of left arm and left knee pain. She suffered a fall back in November. Since then she has had diffuse pain and numbness in the left arm. Some weakness as well. She has also had pain in the left knee. She was previously seen in our office a couple years ago with left leg pain. She has a history of a previous tibial plateau fracture in this knee. History of posttraumatic osteoarthritis as well. X-rays done on February 11 of this year showed that those post surgical changes are stable. Mild tricompartmental DJD. Nothing acute. She gets intermittent swelling in the knee. She was recently placed on naproxen sodium which has seemed to help some she continues to have pain.  Interim medical history reviewed Medications reviewed Allergies reviewed    Review of Systems     Objective:   Physical Exam Well-developed, well-nourished. No acute distress.  Patient has good cervical range of motion. No tenderness to palpation along cervical midline.  Neurological exam shows reflexes to be equal at the biceps, triceps, and brachial radialis tendons bilaterally. No atrophy. No focal weakness. There is diffuse 4/5 weakness secondary to pain.  Left shoulder: Full painless range of motion. Good cuff strength.  Left knee: Range of motion 0-90. 1+ boggy synovitis. Diffuse tenderness to palpation. No erythema. Knee is grossly stable ligaments exam. Patient ambulates with a limp.  X-rays are as above       Assessment & Plan:  Left arm cervical radiculopathy Left knee pain secondary to DJD  I recommended a cortisone injection for the patient's left knee today but she refused. Instead we will try physical therapy and a body helix compression sleeve. I will order physical therapy for both her knee and her cervical  radiculopathy. If she changes her mind, the patient may feel free to return to the office at a later date for an injection. Follow-up when necessary.
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
## 2014-09-12 NOTE — Telephone Encounter (Signed)
CSW contact [employer] to obtain behavioral health benefits.  Pt's in-network copayment for outpatient services would be $40, $0 deductible.  CSW will send Sarah [name] information regarding copayment and in-network providers.  In addition, CSW will send calendar of free support groups through [place].

## 2014-09-13 NOTE — Addendum Note (Signed)
Addended by: Hulan Fray on: 09/13/2014 06:49 PM   Modules accepted: Orders

## 2015-01-08 ENCOUNTER — Encounter: Payer: Self-pay | Admitting: *Deleted

## 2015-04-13 ENCOUNTER — Ambulatory Visit (INDEPENDENT_AMBULATORY_CARE_PROVIDER_SITE_OTHER): Payer: Medicare Other | Admitting: Internal Medicine

## 2015-04-13 ENCOUNTER — Encounter: Payer: Self-pay | Admitting: Internal Medicine

## 2015-04-13 VITALS — BP 127/59 | HR 81 | Temp 98.1°F | Ht 67.0 in | Wt 245.5 lb

## 2015-04-13 DIAGNOSIS — F419 Anxiety disorder, unspecified: Secondary | ICD-10-CM | POA: Diagnosis not present

## 2015-04-13 DIAGNOSIS — R739 Hyperglycemia, unspecified: Secondary | ICD-10-CM | POA: Diagnosis not present

## 2015-04-13 DIAGNOSIS — I10 Essential (primary) hypertension: Secondary | ICD-10-CM

## 2015-04-13 DIAGNOSIS — R5383 Other fatigue: Secondary | ICD-10-CM

## 2015-04-13 DIAGNOSIS — F411 Generalized anxiety disorder: Secondary | ICD-10-CM

## 2015-04-13 DIAGNOSIS — G8929 Other chronic pain: Secondary | ICD-10-CM

## 2015-04-13 DIAGNOSIS — M79605 Pain in left leg: Secondary | ICD-10-CM

## 2015-04-13 DIAGNOSIS — M79662 Pain in left lower leg: Secondary | ICD-10-CM | POA: Diagnosis not present

## 2015-04-13 LAB — GLUCOSE, CAPILLARY: Glucose-Capillary: 80 mg/dL (ref 65–99)

## 2015-04-13 LAB — POCT GLYCOSYLATED HEMOGLOBIN (HGB A1C): Hemoglobin A1C: 5.5

## 2015-04-13 MED ORDER — GABAPENTIN 100 MG PO CAPS: ORAL_CAPSULE | ORAL | Status: DC

## 2015-04-13 MED ORDER — TRAZODONE HCL 100 MG PO TABS
100.0000 mg | ORAL_TABLET | Freq: Every day | ORAL | Status: DC
Start: 2015-04-13 — End: 2015-06-26

## 2015-04-13 NOTE — Progress Notes (Signed)
Medicine attending: I personally interviewed and briefly examined this patient, and reviewed pertinent clinical laboratory  data  with resident physician Dr.Parth Saraiya and we discussed a   management plan. 

## 2015-04-13 NOTE — Patient Instructions (Signed)
Thank you for your visit today Please follow up in 2 weeks to discuss lab results Please stop the sertraline, and then start taking the trazodone- Please do not stop the sertraline until you receive the trazodone  Please stop the naproxyn Please take gabapentin two times a day, then 2 pills at bedtime.
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
## 2015-04-13 NOTE — Assessment & Plan Note (Signed)
Pt has a lot of stressors in the past one and half years including having to care for her husband who had multiple strokes, and the death of her son.  Her main problem today was low energy resulting from the inability to both fall asleep and stay asleep. She goes to bed at 11-12 and typical wake up time is 6 AM. She frequently takes naps in the afternoon. She watches TV right before she goes to bed. She wishes that she has more energy to clean her house and do activities like going out as all she does is sit in her house and watch TV.  She says sertraline is not helping her at all. She currently does not take valium or other BZDs.  Plan -Switched from sertraline to trazodone 100 mg at bedtime to help improve her sleep -advised improved sleep hygiene -If she continues to have problems with her anxiety and depression, then we will refer to a psychiatrist- she has been offered this in the past and she did not opt for it. -At the end of the visit, She asked about the appropriateness of valium for long term, and I advised her that it was not appropriate.

## 2015-04-13 NOTE — Assessment & Plan Note (Addendum)
She says that she is having burning and tingling on her anterior shin- left more than right. And she has a crawling sensation on her legs. She says she continued to have left knee pain but says it is a chronic issue .All of this happened after she fell on her leg last year at [facility]. She also has weight gain since last seen. No RLS symptoms. She says the gabapentin 100 mg tid is not adequate On physical exam, the dorsalis pedis pulse was decreased on the left than right.  Left knee has mild swelling and crepitus, consistent with osteoarthritis changes. Normal sensation and motor strength in neuro exam in all extremities. Review of chart indicates she has had anemia in the past. TSH has not been checked in many years.   -Plan: -check CBC, CMP -TSH, vitamin D -T53, folic acid level - gabapentin 100 mg, 100 mg and 200 mg at bedtime

## 2015-04-18 ENCOUNTER — Other Ambulatory Visit (INDEPENDENT_AMBULATORY_CARE_PROVIDER_SITE_OTHER): Payer: Medicare Other

## 2015-04-18 DIAGNOSIS — M79605 Pain in left leg: Secondary | ICD-10-CM

## 2015-04-18 DIAGNOSIS — R5383 Other fatigue: Secondary | ICD-10-CM

## 2015-04-19 ENCOUNTER — Telehealth: Payer: Self-pay | Admitting: Internal Medicine

## 2015-04-19 DIAGNOSIS — E559 Vitamin D deficiency, unspecified: Secondary | ICD-10-CM

## 2015-04-19 LAB — CMP14 + ANION GAP
ALBUMIN: 4.3 g/dL (ref 3.6–4.8)
ALT: 12 IU/L (ref 0–32)
ANION GAP: 16 mmol/L (ref 10.0–18.0)
AST: 15 IU/L (ref 0–40)
Albumin/Globulin Ratio: 1.3 (ref 1.1–2.5)
Alkaline Phosphatase: 100 IU/L (ref 39–117)
BUN / CREAT RATIO: 10 — AB (ref 11–26)
BUN: 9 mg/dL (ref 8–27)
CO2: 28 mmol/L (ref 18–29)
CREATININE: 0.93 mg/dL (ref 0.57–1.00)
Calcium: 9.5 mg/dL (ref 8.7–10.3)
Chloride: 97 mmol/L (ref 97–108)
GFR calc non Af Amer: 63 mL/min/{1.73_m2} (ref >59)
GFR, EST AFRICAN AMERICAN: 73 mL/min/{1.73_m2} (ref >59)
GLOBULIN, TOTAL: 3.3 g/dL (ref 1.5–4.5)
Glucose: 81 mg/dL (ref 65–99)
Potassium: 4 mmol/L (ref 3.5–5.2)
SODIUM: 141 mmol/L (ref 134–144)
Total Protein: 7.6 g/dL (ref 6.0–8.5)

## 2015-04-19 LAB — FOLATE RBC
FOLATE, RBC: 781 ng/mL (ref >498)
Folate, Hemolysate: 297.7 ng/mL
Hematocrit: 38.1 % (ref 34.0–46.6)

## 2015-04-19 LAB — CBC
HEMATOCRIT: 38.9 % (ref 34.0–46.6)
HEMOGLOBIN: 12.4 g/dL (ref 11.1–15.9)
MCH: 29.1 pg (ref 26.6–33.0)
MCHC: 31.9 g/dL (ref 31.5–35.7)
MCV: 91 fL (ref 79–97)
Platelets: 261 10*3/uL (ref 150–379)
RBC: 4.26 x10E6/uL (ref 3.77–5.28)
RDW: 14.6 % (ref 12.3–15.4)
WBC: 7.3 10*3/uL (ref 3.4–10.8)

## 2015-04-19 LAB — TSH: TSH: 3.02 u[IU]/mL (ref 0.450–4.500)

## 2015-04-19 LAB — VITAMIN B12: VITAMIN B 12: 428 pg/mL (ref 211–946)

## 2015-04-19 LAB — VITAMIN D 25 HYDROXY (VIT D DEFICIENCY, FRACTURES): VIT D 25 HYDROXY: 8 ng/mL — AB (ref 30.0–100.0)

## 2015-04-19 NOTE — Telephone Encounter (Signed)
Called the patient and left the voicemail informing that her Vitamin D level was very low- she needs to be on a weekly supplementation, and she needs DEXA scan as likely she is osteopaenic.  I am not sure of her insurance status as it says " no coverage", however, Pt is over 65 so she should be eligible for Medicare.  I asked her to call the clinic back to inform about the insurance status and prescription  Will prescribe 50,000 IU of Vitamin D weekly for 8 weeks after her return call and order DEXA  Plan (1) She needs to be enrolled in medicare (2) vitamin d supplementation and dexa

## 2015-04-20 MED ORDER — VITAMIN D (ERGOCALCIFEROL) 1.25 MG (50000 UNIT) PO CAPS: 50000.0000 [IU] | ORAL_CAPSULE | ORAL | Status: DC

## 2015-04-20 NOTE — Telephone Encounter (Signed)
Ordered weekly vit Dm, and amb referral to DEXA

## 2015-04-20 NOTE — Telephone Encounter (Signed)
Talked with pt about Dr Tiburcio Pea note about Vit D and Bone density. Pt has [employer] and C. Cyndi Bender states will cover bone density. Pt uses [facility] for pharmacy for Vit D. Hilda Blades Oluwatobiloba Martin RN 04/20/15 3:30PM

## 2015-04-20 NOTE — Telephone Encounter (Signed)
Thank you Parth

## 2015-04-20 NOTE — Addendum Note (Signed)
Addended by: Burgess Estelle A on: 04/20/2015 03:51 PM   Modules accepted: Orders

## 2015-04-23 NOTE — Telephone Encounter (Signed)
Thank you.  As noted below, pt is enrolled in a Medicare Advantage program.

## 2015-04-26 ENCOUNTER — Telehealth: Payer: Self-pay | Admitting: Internal Medicine

## 2015-04-26 ENCOUNTER — Telehealth: Payer: Self-pay | Admitting: *Deleted

## 2015-04-26 NOTE — Telephone Encounter (Signed)
Pt called: States insurance does not want to pay for vit d therapy also if she is to take it for 8 weeks 1 weekly then why did he order 15? States after she started the trazadone her leg hurt, she read the side effects and they frightened her but she did rest better taking it and she doesn't feel as tired She is worried because several of her meds were changed She was reassured, explained that the meds needed to get in her sysytem and the old ones needed to get out of her system but if she is uncomfortable taking any medicines that she is free to decide what she wants to do, she is agreeableappt was made w/ dr Dareen Piano for Tuesday 10/18 at [facility]

## 2015-04-26 NOTE — Telephone Encounter (Signed)
Contacted pt's insurance to initiate prior authorization on Vit D. Request sent for "clinical review".  May take 24-72 hours for decision.Regenia Skeeter, Sandra Tellefsen Cassady10/13/20164:36 PM    Ref # [id number]

## 2015-04-26 NOTE — Telephone Encounter (Signed)
Pt called requesting the nurse to call back regarding Vitamin D. Please call back.

## 2015-04-30 ENCOUNTER — Telehealth: Payer: Self-pay | Admitting: Internal Medicine

## 2015-04-30 NOTE — Telephone Encounter (Signed)
Pt states insurance will not paid for Vitamin D. Please call pt back.

## 2015-05-01 ENCOUNTER — Encounter: Payer: Self-pay | Admitting: Internal Medicine

## 2015-05-02 NOTE — Telephone Encounter (Signed)
Kaye g. Has submitted a PA for this, she will check on status and will call pt tomorrow

## 2015-05-02 NOTE — Telephone Encounter (Signed)
INSURANCE WILL NOT PAY FOR VITAMIN D, NEED NURSE TO CALL HER BACK, PLEASE TEXT HER WITH ANSWER

## 2015-05-02 NOTE — Telephone Encounter (Signed)
Her vitamin D level was low -  The repeat was 8. She can take it for 8 weeks and follow up with [place] and we can decide if we need to continue it further. Thank you

## 2015-05-07 NOTE — Telephone Encounter (Signed)
Pt call back regarding her vitamin D. Please call pt back.

## 2015-05-08 NOTE — Telephone Encounter (Signed)
Sarah [name], is there any update on this PA sent on 10/13?

## 2015-06-05 ENCOUNTER — Encounter: Payer: Self-pay | Admitting: Student

## 2015-06-14 ENCOUNTER — Other Ambulatory Visit: Payer: Self-pay

## 2015-06-14 MED ORDER — ATORVASTATIN CALCIUM 10 MG PO TABS: 10.0000 mg | ORAL_TABLET | Freq: Every day | ORAL | Status: DC

## 2015-06-20 ENCOUNTER — Other Ambulatory Visit: Payer: Self-pay | Admitting: Internal Medicine

## 2015-06-22 ENCOUNTER — Other Ambulatory Visit: Payer: Self-pay | Admitting: Internal Medicine

## 2015-06-22 NOTE — Telephone Encounter (Signed)
Pt requesting the nurse to call back regarding vitamin D.

## 2015-06-23 ENCOUNTER — Other Ambulatory Visit: Payer: Self-pay | Admitting: Internal Medicine

## 2015-06-25 MED ORDER — DIAZEPAM 5 MG PO TABS: 5.0000 mg | ORAL_TABLET | Freq: Four times a day (QID) | ORAL | Status: DC | PRN

## 2015-06-25 NOTE — Telephone Encounter (Signed)
Spoke with pt, she reports still having no energy, not enough to clean her house some weeks.  Pt does report that she hasn't started taking the vitamin D (ordered in Oct) yet because she couldn't afford it, she has ordered it now and is waiting for the shipment to come in.  She will allow 2-3 weeks to see a change and will call us to re-evaluate if needed.  Pt requesting a refill on her diazepam 5mg , she states she takes very infrequently which is why it was removed from her active list, however, with the upcoming holidays she is feeling anxious and feels that it would help her for the next few weeks.  She lost her dad last year and this is a trigger for her right now along with taking care of her spouse.

## 2015-06-25 NOTE — Telephone Encounter (Signed)
Was D/C'd by MD in Sep. One fill but PCP to decide on RF

## 2015-06-25 NOTE — Telephone Encounter (Signed)
She has gotten it infreq. I am providing small supply but this is no guarantee that her PCP will cont it.

## 2015-06-26 ENCOUNTER — Other Ambulatory Visit: Payer: Self-pay | Admitting: Internal Medicine

## 2015-06-26 MED ORDER — TRAZODONE HCL 100 MG PO TABS: 100.0000 mg | ORAL_TABLET | Freq: Every day | ORAL | Status: DC

## 2015-06-26 NOTE — Telephone Encounter (Signed)
rx phoned in

## 2015-06-26 NOTE — Telephone Encounter (Signed)
Review of notes suggests the trazodone replaced sertraline for an anxiety state and for sleep.  I am unsure if this is effective for the anxiety although it may be helpful for insomnia and depression if the patient has this.  As this was recently prescribed and the patient is requesting a refill I will provide a month supply without refills and ask that the PCP address if this is to be continued for anxiety, depression, or insomnia to clarify its indication moving forward.

## 2015-06-26 NOTE — Telephone Encounter (Signed)
Pt requesting Desyrel to be filled.

## 2015-08-30 ENCOUNTER — Other Ambulatory Visit: Payer: Self-pay | Admitting: Internal Medicine

## 2015-09-14 ENCOUNTER — Telehealth: Payer: Self-pay | Admitting: Internal Medicine

## 2015-09-14 NOTE — Telephone Encounter (Signed)
APPT REMINDER CALL, LMTCB IF SHE NEEDS TO CANCEL °

## 2015-09-17 ENCOUNTER — Telehealth: Payer: Self-pay | Admitting: Internal Medicine

## 2015-09-17 ENCOUNTER — Ambulatory Visit: Payer: Self-pay | Admitting: Internal Medicine

## 2015-09-17 ENCOUNTER — Encounter: Payer: Self-pay | Admitting: Internal Medicine

## 2015-09-17 NOTE — Telephone Encounter (Signed)
APPT REMINDER CALL, LMTCB IF SHE NEEDS TO CANCEL °

## 2015-09-18 ENCOUNTER — Other Ambulatory Visit (HOSPITAL_COMMUNITY): Payer: Self-pay

## 2015-09-18 ENCOUNTER — Encounter: Payer: Self-pay | Admitting: Internal Medicine

## 2015-09-18 ENCOUNTER — Ambulatory Visit (INDEPENDENT_AMBULATORY_CARE_PROVIDER_SITE_OTHER): Payer: Medicare Other | Admitting: Internal Medicine

## 2015-09-18 ENCOUNTER — Other Ambulatory Visit: Payer: Self-pay | Admitting: *Deleted

## 2015-09-18 VITALS — BP 127/66 | HR 79 | Temp 98.2°F | Ht 67.0 in | Wt 245.1 lb

## 2015-09-18 DIAGNOSIS — M25562 Pain in left knee: Secondary | ICD-10-CM

## 2015-09-18 DIAGNOSIS — E785 Hyperlipidemia, unspecified: Secondary | ICD-10-CM | POA: Diagnosis not present

## 2015-09-18 DIAGNOSIS — E559 Vitamin D deficiency, unspecified: Secondary | ICD-10-CM | POA: Diagnosis not present

## 2015-09-18 DIAGNOSIS — I10 Essential (primary) hypertension: Secondary | ICD-10-CM | POA: Diagnosis not present

## 2015-09-18 DIAGNOSIS — Z79899 Other long term (current) drug therapy: Secondary | ICD-10-CM

## 2015-09-18 DIAGNOSIS — G8929 Other chronic pain: Secondary | ICD-10-CM | POA: Diagnosis not present

## 2015-09-18 DIAGNOSIS — Z23 Encounter for immunization: Secondary | ICD-10-CM | POA: Diagnosis not present

## 2015-09-18 DIAGNOSIS — N950 Postmenopausal bleeding: Secondary | ICD-10-CM | POA: Insufficient documentation

## 2015-09-18 DIAGNOSIS — Z1159 Encounter for screening for other viral diseases: Secondary | ICD-10-CM

## 2015-09-18 DIAGNOSIS — M25561 Pain in right knee: Secondary | ICD-10-CM | POA: Diagnosis not present

## 2015-09-18 DIAGNOSIS — R319 Hematuria, unspecified: Secondary | ICD-10-CM

## 2015-09-18 DIAGNOSIS — Z0000 Encounter for general adult medical examination without abnormal findings: Secondary | ICD-10-CM

## 2015-09-18 DIAGNOSIS — F411 Generalized anxiety disorder: Secondary | ICD-10-CM

## 2015-09-18 MED ORDER — GABAPENTIN 300 MG PO CAPS: ORAL_CAPSULE | ORAL | Status: DC

## 2015-09-18 MED ORDER — DULOXETINE HCL 30 MG PO CPEP: 30.0000 mg | ORAL_CAPSULE | Freq: Every day | ORAL | Status: DC

## 2015-09-18 MED ORDER — DIAZEPAM 5 MG PO TABS: 5.0000 mg | ORAL_TABLET | Freq: Four times a day (QID) | ORAL | Status: DC | PRN

## 2015-09-18 MED ORDER — DICLOFENAC SODIUM 1 % TD GEL: 4.0000 g | Freq: Four times a day (QID) | TRANSDERMAL | Status: DC

## 2015-09-18 NOTE — Assessment & Plan Note (Signed)
-   Patient states she has noticed blood in her underwear on waking up - She is unsure of the source- possibly urinary tract or uterine - Has not noted blood in her urine or stool during the rest of the day - Will check u/a and BMP - Patient to f/u ob for possible uterine etiology. Will likely need pelvic exam and possible ultrasound if this continues and u/a is normal

## 2015-09-18 NOTE — Assessment & Plan Note (Signed)
-   Patient still with episodes of anxiety - She uses valium sparingly as needed. No concerns for abuse at this point - Would refill her valium and also start her on duloxetine for anxiety and pain - I believe she does have a component of depression as well and this will help her - She was on sertraline in the past with no good effect

## 2015-09-18 NOTE — Patient Instructions (Addendum)
-   It was a pleasure seeing you today - We will check your bloodwork today - We will also check your urine to look for blood - Please call your ob and tell them about the blood in your underwear. They will likely need to follow up with you sooner - we will give you a flu shot today - I have given you voltaren gel for your pain - Please follow up in 1 month - I have also started yuo on duloxetine for your anxiety in addition to your valium

## 2015-09-18 NOTE — Telephone Encounter (Signed)
Pt stated you told her to take gabapentin differently today, please send a new script i called diazepam in, called pt and that's when she told me about gabapentin but she could not remember what you told her

## 2015-09-18 NOTE — Assessment & Plan Note (Signed)
BP Readings from Last 3 Encounters:  09/18/15 127/66  04/13/15 127/59  09/06/14 116/73    Lab Results  Component Value Date   NA 141 04/18/2015   K 4.0 04/18/2015   CREATININE 0.93 04/18/2015    Assessment: Blood pressure control:  well controlled Progress toward BP goal:   at goal Comments: patient is compliant with meds  Plan: Medications:  continue current medications Educational resources provided: brochure (denies ) Self management tools provided:   Other plans:

## 2015-09-18 NOTE — Progress Notes (Signed)
   Subjective:    Patient ID: Sarah [name], female    DOB: [birth year], 70 y.o.   MRN: [id number]  HPI Patient presents today for follow up of her chronic medical issues including her anxiety and HTN. Patient states that she feels tired and she has no energy and this is a chronic issue. She has had recent w/u for fatigue with CBC, TSH, B12 and folate which were all in normal limits. She states she is under a lot of pressure at home as she is taking care of her husband. States her sleep is better on the trazodone and that she has a normal appetite. She also complains of worsening knee pain- this is a chronic issue in both knees, worse on bearing weight and ambulating. No fevers/chills, no swelling/redness in her knees. Patient also states that she sometimes notes a spot of blood in her underwear when she wakes up. States that she does not notice any blood in her urine or stool and that she checks for this. She states it is similar to right before she gets a period but she has been post menopausal for years now.   Review of Systems  Constitutional: Positive for fatigue. Negative for fever, chills, activity change and appetite change.  HENT: Negative.   Eyes: Negative.   Respiratory: Negative.   Cardiovascular: Negative.   Gastrointestinal: Negative.   Musculoskeletal: Positive for arthralgias and gait problem. Negative for myalgias and back pain.  Skin: Negative.   Neurological: Negative.   Psychiatric/Behavioral: Negative for hallucinations, behavioral problems, self-injury and agitation. The patient is nervous/anxious.        Objective:   Physical Exam  Constitutional: She is oriented to person, place, and time. She appears well-developed and well-nourished.  HENT:  Head: Normocephalic and atraumatic.  Cardiovascular: Normal rate, regular rhythm and normal heart sounds.   Pulmonary/Chest: Effort normal and breath sounds normal. No respiratory distress. She has no wheezes.  Abdominal:  Soft. Bowel sounds are normal. She exhibits no distension. There is no tenderness.  Musculoskeletal: Normal range of motion. She exhibits no edema or tenderness.  Neurological: She is alert and oriented to person, place, and time.  Skin: Skin is warm and dry. No erythema.  Psychiatric: She has a normal mood and affect. Her behavior is normal.          Assessment & Plan:  Please see problem based charting for assessment and plan:

## 2015-09-18 NOTE — Assessment & Plan Note (Signed)
-   Will give flu shot today - Patient will need PNA vaccine on next visit - Patient is also due for a mammogram. States she will get this and a pap smear on her next f/u with her Ob/gyn - Will screen for hep C

## 2015-09-18 NOTE — Assessment & Plan Note (Addendum)
-   Patient with low vitamin D levels - completed 8 week course of 50,000 units of vitamin D - Will check repeat level today

## 2015-09-18 NOTE — Assessment & Plan Note (Signed)
-   Patient is compliant with atorvastatin - Will check lipid panel today

## 2015-09-18 NOTE — Assessment & Plan Note (Signed)
-   patient is agreeable to screening for Hepatitis C - Does not currently drink ETOH and has no history of IVDA - Patient does state son died from "liver disorder" and it was likely a virus.  - Will screen for hep C

## 2015-09-18 NOTE — Addendum Note (Signed)
Addended by: Aldine Contes on: 09/18/2015 03:18 PM   Modules accepted: Orders

## 2015-09-18 NOTE — Assessment & Plan Note (Signed)
-   Patient with b/l knee pain likely secondary to OA - Patient has chronic pain and it has become slightly worse over teh last 2-3 weeks. - No swelling/erythema/increased local warmth noted,. - Will prescribe voltaren gel prn - If patient has worsening pain or swelling she will need to follow up - Patient also complains of burning pain in her feet intermittently. Had a normal A1c last year - She was on gabapentin 100 mg tid. Will increase to 300 mg tid and monitor

## 2015-09-19 LAB — BMP8+ANION GAP
ANION GAP: 18 mmol/L (ref 10.0–18.0)
BUN/Creatinine Ratio: 10 — ABNORMAL LOW (ref 11–26)
BUN: 10 mg/dL (ref 8–27)
CALCIUM: 9 mg/dL (ref 8.7–10.3)
CHLORIDE: 99 mmol/L (ref 96–106)
CO2: 25 mmol/L (ref 18–29)
Creatinine, Ser: 1.03 mg/dL — ABNORMAL HIGH (ref 0.57–1.00)
GFR calc Af Amer: 65 mL/min/{1.73_m2} (ref >59)
GFR calc non Af Amer: 56 mL/min/{1.73_m2} — ABNORMAL LOW (ref >59)
GLUCOSE: 89 mg/dL (ref 65–99)
POTASSIUM: 4.3 mmol/L (ref 3.5–5.2)
Sodium: 142 mmol/L (ref 134–144)

## 2015-09-19 LAB — MICROSCOPIC EXAMINATION
CASTS: NONE SEEN /LPF
Epithelial Cells (non renal): >10 /hpf — AB (ref 0–10)

## 2015-09-19 LAB — LIPID PANEL
CHOL/HDL RATIO: 3.3 ratio (ref 0.0–4.4)
Cholesterol, Total: 150 mg/dL (ref 100–199)
HDL: 45 mg/dL (ref >39)
LDL Calculated: 86 mg/dL (ref 0–99)
Triglycerides: 93 mg/dL (ref 0–149)
VLDL Cholesterol Cal: 19 mg/dL (ref 5–40)

## 2015-09-19 LAB — URINALYSIS, ROUTINE W REFLEX MICROSCOPIC
BILIRUBIN UA: NEGATIVE
Glucose, UA: NEGATIVE
KETONES UA: NEGATIVE
LEUKOCYTES UA: NEGATIVE
Nitrite, UA: NEGATIVE
PROTEIN UA: NEGATIVE
SPEC GRAV UA: 1.015 (ref 1.005–1.030)
Urobilinogen, Ur: 1 mg/dL (ref 0.2–1.0)
pH, UA: 6 (ref 5.0–7.5)

## 2015-09-19 LAB — VITAMIN D 25 HYDROXY (VIT D DEFICIENCY, FRACTURES): Vit D, 25-Hydroxy: 43.4 ng/mL (ref 30.0–100.0)

## 2015-09-19 LAB — HEPATITIS C ANTIBODY

## 2015-09-20 ENCOUNTER — Telehealth: Payer: Self-pay | Admitting: Internal Medicine

## 2015-09-20 NOTE — Telephone Encounter (Signed)
Called patient to discuss results of lab work. Of note her creatinine is mildly elevated possibly pre renal. Will follow up repeat BMP on next visit. Patient also noted to have small amount of blood in her urine. Unsure if this is secondary to a primary urinary source or secondary to a gyn etiology. Patient has noted intermittent episodes of waking up with spots of blood in her underwear but denies hematuria. She will f/u with gyn (called today for an appointment) and if gyn w/u is negative and this spotting persists would consider further GU w/u to determine etiology. No weight loss or loss of appetite but does complain of fatigue. Will follow closely

## 2015-09-25 ENCOUNTER — Other Ambulatory Visit: Payer: Self-pay | Admitting: Internal Medicine

## 2015-09-26 ENCOUNTER — Telehealth: Payer: Self-pay

## 2015-09-26 NOTE — Telephone Encounter (Signed)
rec'd call from [employer] to [employer] for patient.  Provided clinical indication to pharmacist and rec'd auth for medication valid through 07/13/16 ref# [id number] Will call pharmacy to notify

## 2015-09-27 NOTE — H&P (Signed)
Sarah [name] is an 70 y.o. female. She presents for hysteroscopy and endometrial sampling and possible polypectomy  Pertinent Gynecological History: OB History: G?, P5   Menstrual History:   No LMP recorded. Patient is postmenopausal.    Past Medical History  Diagnosis Date  . Obesity   . Cellulitis of left foot   . Dystrophic nail   . Abnormal electrocardiogram   . Postmenopausal status   . Peripheral edema   . Anxiety   . High cholesterol   . Hypertension     Past Surgical History  Procedure Laterality Date  . Closed reduction tibial fracture  04/2011  . Tubal ligation  1994    Family History  Problem Relation Age of Onset  . Diabetes Mother   . Hypertension Mother   . Aneurysm Mother   . Cancer Father   . Colon cancer Father 44  . Hypertension Daughter     Social History:  reports that she has never smoked. She has never used smokeless tobacco. She reports that she does not drink alcohol or use illicit drugs.  Allergies: No Known Allergies  No prescriptions prior to admission    Review of Systems  Constitutional: Negative.   HENT: Negative.   Respiratory: Negative.   Cardiovascular: Negative.   Gastrointestinal: Negative.   Genitourinary: Negative.   Skin: Negative.   Neurological: Negative.     There were no vitals taken for this visit. Physical Exam  Constitutional: She appears well-developed.  HENT:  Head: Normocephalic.  Eyes: Pupils are equal, round, and reactive to light.  Neck: Normal range of motion.  Cardiovascular: Normal rate.   Respiratory: Effort normal.  GI: Soft.  Genitourinary: Vagina normal and uterus normal.  Musculoskeletal: Normal range of motion.  Neurological: She is alert.  Skin: Skin is warm.   Office Ultrasound:  Small uterus with 1.36 cm endometrial thickness.  Assessment/Plan: Hysteroscopy and possible polypectomy and D&C.  [name] D 09/27/2015, 8:23 PM

## 2015-09-28 ENCOUNTER — Other Ambulatory Visit: Payer: Self-pay | Admitting: Internal Medicine

## 2015-09-29 IMAGING — CR DG LUMBAR SPINE COMPLETE 4+V
5 series · 5 of 5 positions shown · non-contrast
Comparison: 07/25/2012

CLINICAL DATA: Trip and fall injury with low back pain.

EXAM:
LUMBAR SPINE - COMPLETE 4+ VIEW

[t lumbar spine ap]
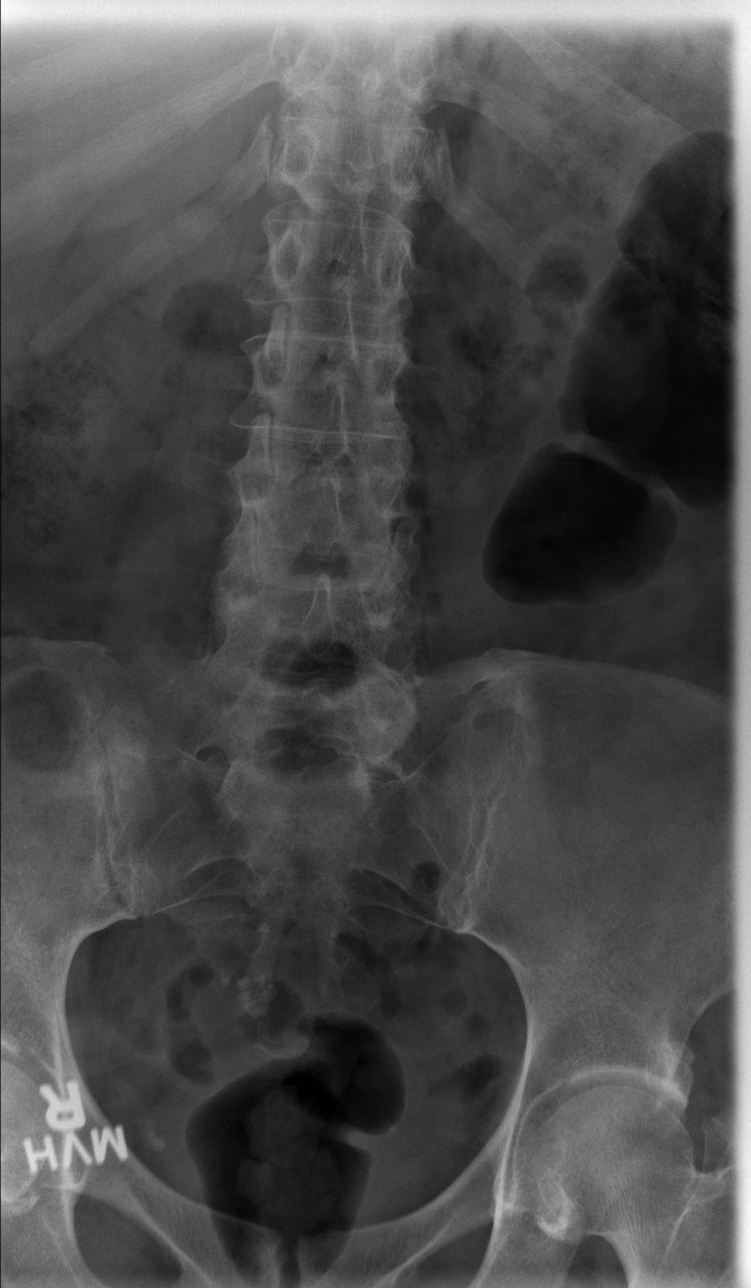

[t lumbar spine obl (1 of 2)]
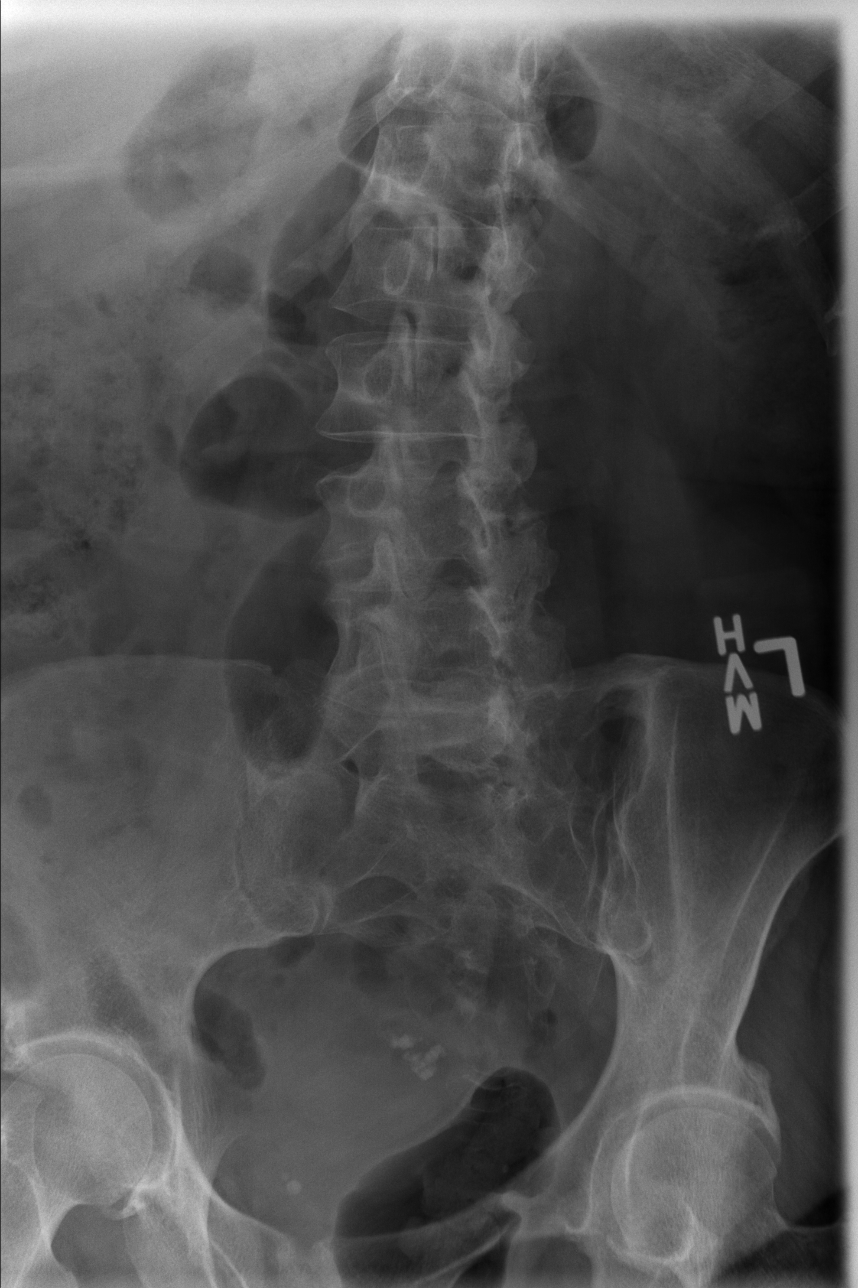

[t lumbar spine obl (2 of 2)]
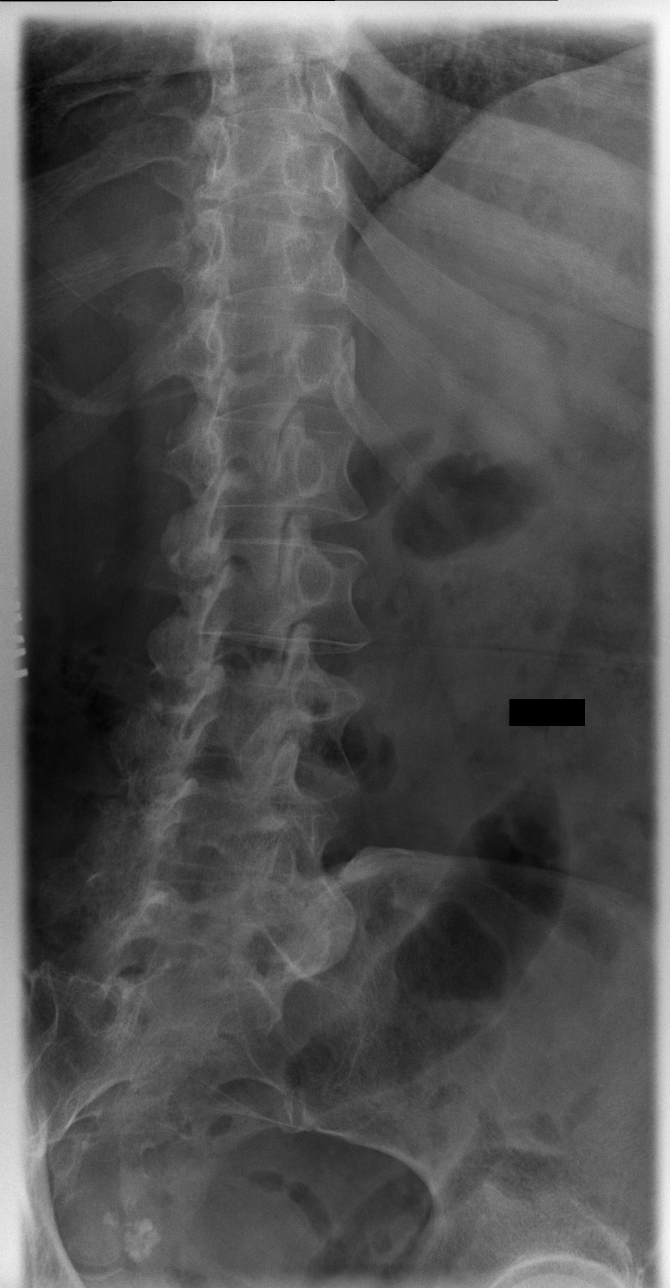

[t lumbar spine lat]
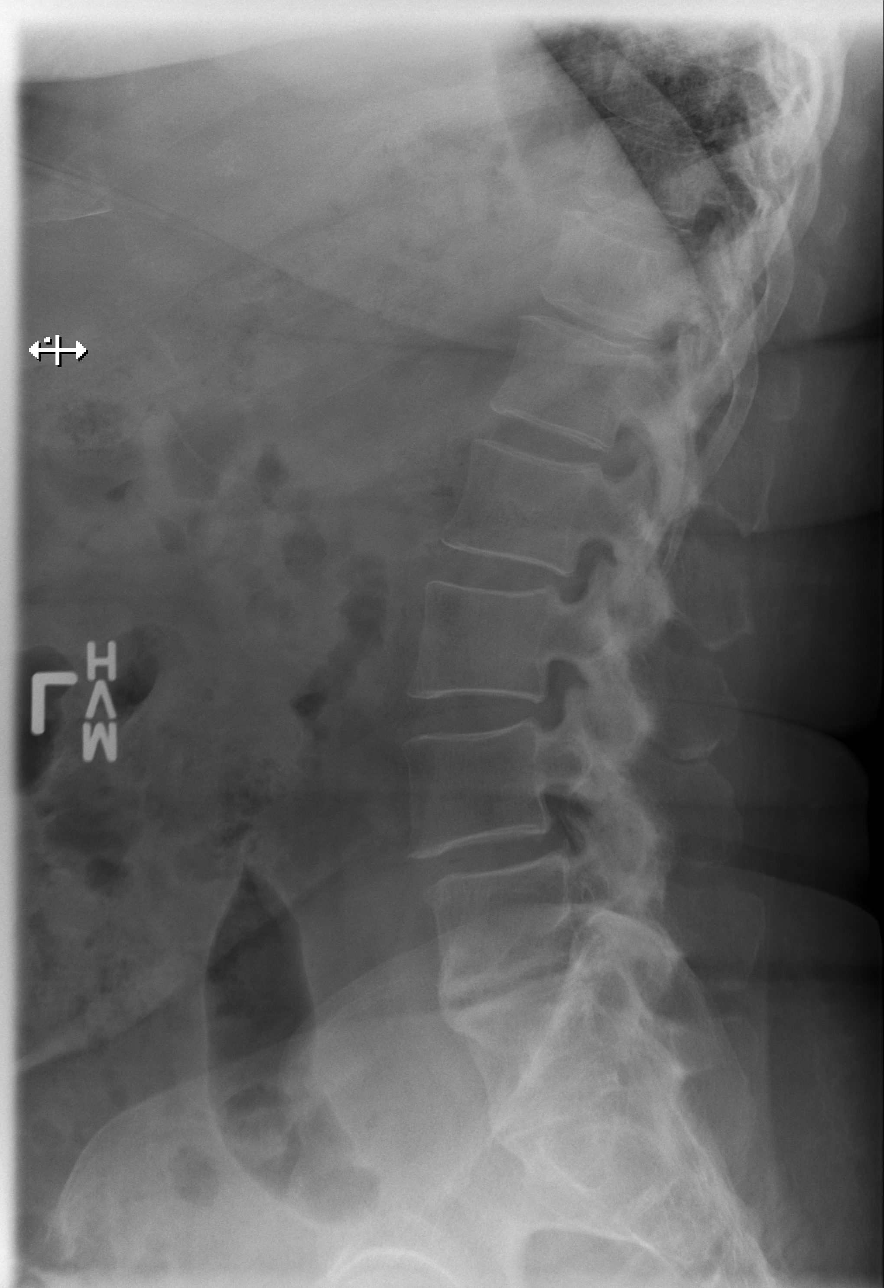

[t lumbar l-5 s-1 spot]
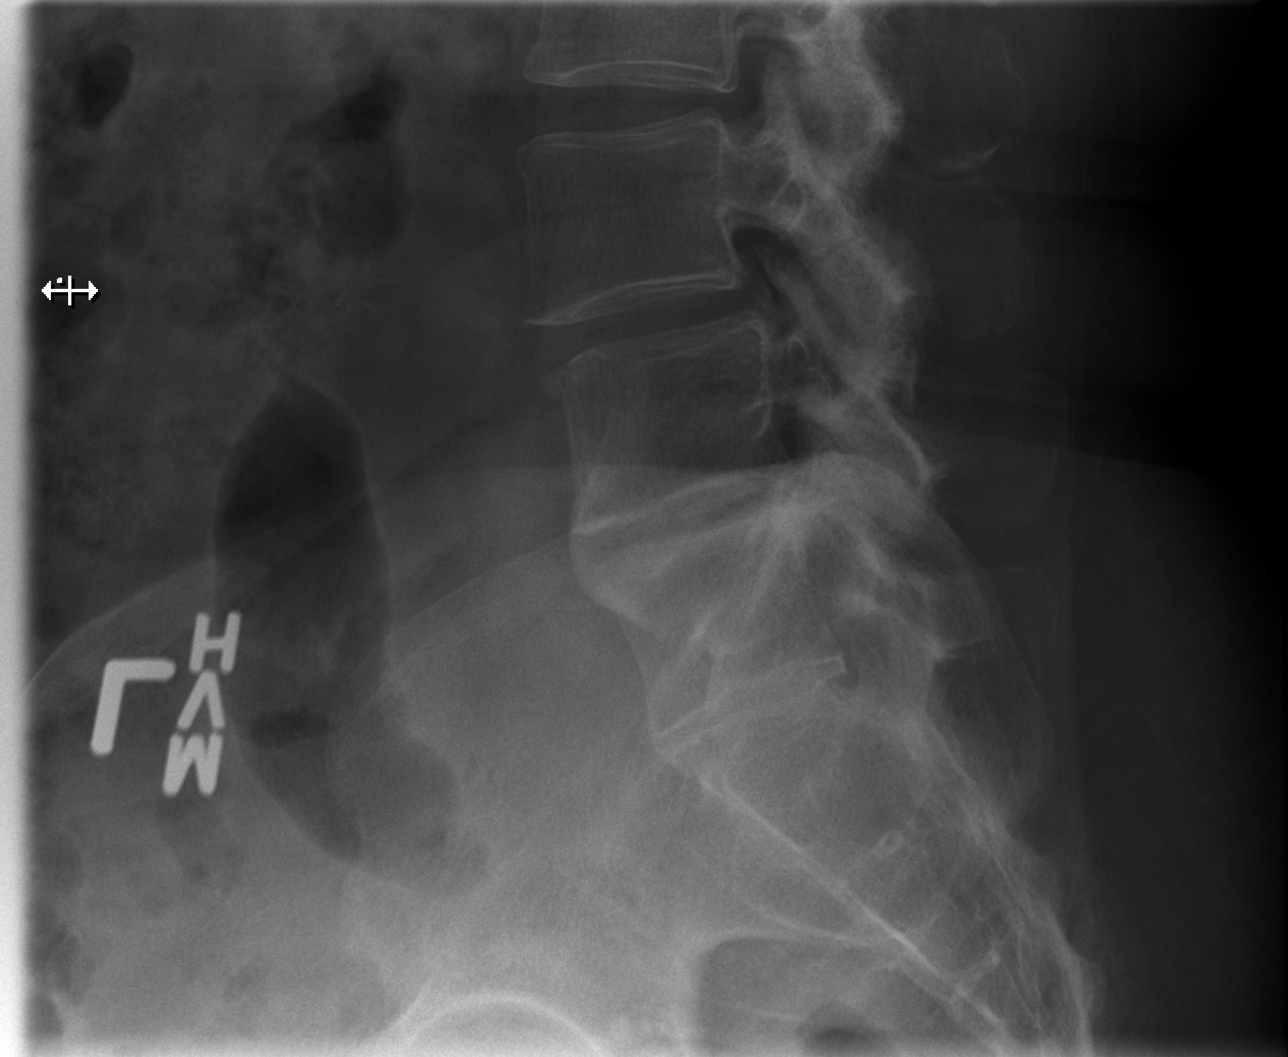

[5 of 5 positions shown; findings below may reference images not displayed]

FINDINGS: Sacralization of L5. Degenerative changes in the lower lumbar spine
and facet joints. Slight anterior subluxation of L4 on L5, stable
since prior study. No vertebral compression deformities. No focal
bone lesion or bone destruction. Bone cortex and trabecular
architecture appear intact. No change since prior study.
IMPRESSION: Degenerative changes in the lumbar spine. No acute bony
abnormalities.

## 2015-10-19 ENCOUNTER — Encounter (HOSPITAL_COMMUNITY): Payer: Self-pay

## 2015-10-19 ENCOUNTER — Other Ambulatory Visit: Payer: Self-pay

## 2015-10-19 ENCOUNTER — Encounter (HOSPITAL_COMMUNITY)
Admission: RE | Admit: 2015-10-19 | Discharge: 2015-10-19 | Disposition: A | Discharge status: Home / Self Care | Payer: Medicare Other | Source: Ambulatory Visit | Attending: Obstetrics & Gynecology | Admitting: Obstetrics & Gynecology

## 2015-10-19 DIAGNOSIS — Z780 Asymptomatic menopausal state: Secondary | ICD-10-CM | POA: Diagnosis not present

## 2015-10-19 DIAGNOSIS — Z01812 Encounter for preprocedural laboratory examination: Secondary | ICD-10-CM | POA: Diagnosis not present

## 2015-10-19 DIAGNOSIS — Z01810 Encounter for preprocedural cardiovascular examination: Secondary | ICD-10-CM | POA: Diagnosis present

## 2015-10-19 DIAGNOSIS — E785 Hyperlipidemia, unspecified: Secondary | ICD-10-CM | POA: Diagnosis not present

## 2015-10-19 DIAGNOSIS — F419 Anxiety disorder, unspecified: Secondary | ICD-10-CM | POA: Insufficient documentation

## 2015-10-19 DIAGNOSIS — I10 Essential (primary) hypertension: Secondary | ICD-10-CM | POA: Diagnosis not present

## 2015-10-19 DIAGNOSIS — E669 Obesity, unspecified: Secondary | ICD-10-CM | POA: Insufficient documentation

## 2015-10-19 LAB — CBC
HEMATOCRIT: 37.7 % (ref 36.0–46.0)
Hemoglobin: 12.6 g/dL (ref 12.0–15.0)
MCH: 29.3 pg (ref 26.0–34.0)
MCHC: 33.4 g/dL (ref 30.0–36.0)
MCV: 87.7 fL (ref 78.0–100.0)
PLATELETS: 283 10*3/uL (ref 150–400)
RBC: 4.3 MIL/uL (ref 3.87–5.11)
RDW: 14 % (ref 11.5–15.5)
WBC: 5.9 10*3/uL (ref 4.0–10.5)

## 2015-10-19 LAB — BASIC METABOLIC PANEL
ANION GAP: 4 — AB (ref 5–15)
BUN: 10 mg/dL (ref 6–20)
CALCIUM: 8.9 mg/dL (ref 8.9–10.3)
CO2: 32 mmol/L (ref 22–32)
CREATININE: 1.12 mg/dL — AB (ref 0.44–1.00)
Chloride: 103 mmol/L (ref 101–111)
GFR calc Af Amer: 57 mL/min — ABNORMAL LOW (ref >60)
GFR calc non Af Amer: 49 mL/min — ABNORMAL LOW (ref >60)
GLUCOSE: 97 mg/dL (ref 65–99)
Potassium: 3.4 mmol/L — ABNORMAL LOW (ref 3.5–5.1)
Sodium: 139 mmol/L (ref 135–145)

## 2015-10-19 NOTE — Patient Instructions (Addendum)
   Your procedure is scheduled on: April 11 (Tuesday)  Enter through the [employer] [facility] at: [place] up the phone at the desk and dial [phone] and inform us of your arrival.  Please call this number if you have any problems the morning of surgery: [phone]  Remember: Do not eat or drink after midnight April 10 (monday)  Take these medicines the morning of surgery with a SIP OF WATER: TAKE ALL BLOOD PRESSURE MEDS DAY OF SURGERY  Do not wear jewelry, make-up, or FINGER nail polish No metal in your hair or on your body. Do not wear lotions, powders, perfumes.  You may wear deodorant.  Do not bring valuables to the hospital. Contacts, dentures or bridgework may not be worn into surgery.    Patients discharged on the day of surgery will not be allowed to drive home.

## 2015-10-22 ENCOUNTER — Other Ambulatory Visit: Payer: Self-pay | Admitting: Internal Medicine

## 2015-10-22 NOTE — Telephone Encounter (Signed)
[facility] that [facility] has 09/18/15 rx on file

## 2015-10-29 ENCOUNTER — Ambulatory Visit (HOSPITAL_COMMUNITY): Payer: Medicare Other | Admitting: Anesthesiology

## 2015-10-29 ENCOUNTER — Encounter (HOSPITAL_COMMUNITY)
Admission: RE | Disposition: A | Discharge status: Home / Self Care | Payer: Self-pay | Source: Ambulatory Visit | Attending: Obstetrics & Gynecology

## 2015-10-29 ENCOUNTER — Ambulatory Visit (HOSPITAL_COMMUNITY)
Admission: RE | Admit: 2015-10-29 | Discharge: 2015-10-29 | Disposition: A | Discharge status: Home / Self Care | Payer: Medicare Other | Source: Ambulatory Visit | Attending: Obstetrics & Gynecology | Admitting: Obstetrics & Gynecology

## 2015-10-29 ENCOUNTER — Encounter (HOSPITAL_COMMUNITY): Payer: Self-pay | Admitting: *Deleted

## 2015-10-29 DIAGNOSIS — N950 Postmenopausal bleeding: Secondary | ICD-10-CM | POA: Insufficient documentation

## 2015-10-29 DIAGNOSIS — F419 Anxiety disorder, unspecified: Secondary | ICD-10-CM | POA: Insufficient documentation

## 2015-10-29 DIAGNOSIS — N840 Polyp of corpus uteri: Secondary | ICD-10-CM | POA: Insufficient documentation

## 2015-10-29 DIAGNOSIS — I10 Essential (primary) hypertension: Secondary | ICD-10-CM | POA: Diagnosis not present

## 2015-10-29 DIAGNOSIS — Z9851 Tubal ligation status: Secondary | ICD-10-CM | POA: Diagnosis not present

## 2015-10-29 DIAGNOSIS — E7800 Pure hypercholesterolemia, unspecified: Secondary | ICD-10-CM | POA: Diagnosis not present

## 2015-10-29 HISTORY — PX: HYSTEROSCOPY W/D&C: SHX1775

## 2015-10-29 HISTORY — PX: CERVICAL POLYPECTOMY: SHX88

## 2015-10-29 SURGERY — DILATATION AND CURETTAGE /HYSTEROSCOPY
Anesthesia: General

## 2015-10-29 MED ORDER — EPHEDRINE 5 MG/ML INJ
INTRAVENOUS | Status: AC
  Filled 2015-10-29: qty 10

## 2015-10-29 MED ORDER — DEXAMETHASONE SODIUM PHOSPHATE 4 MG/ML IJ SOLN
INTRAMUSCULAR | Status: AC
  Filled 2015-10-29: qty 1

## 2015-10-29 MED ORDER — PHENYLEPHRINE 40 MCG/ML (10ML) SYRINGE FOR IV PUSH (FOR BLOOD PRESSURE SUPPORT)
PREFILLED_SYRINGE | INTRAVENOUS | Status: AC
  Filled 2015-10-29: qty 10

## 2015-10-29 MED ORDER — MIDAZOLAM HCL 5 MG/5ML IJ SOLN
INTRAMUSCULAR | Status: DC | PRN
  Administered 2015-10-29: 2 mg via INTRAVENOUS

## 2015-10-29 MED ORDER — PROPOFOL 10 MG/ML IV BOLUS
INTRAVENOUS | Status: DC | PRN
  Administered 2015-10-29: 30 mg via INTRAVENOUS
  Administered 2015-10-29: 150 mg via INTRAVENOUS

## 2015-10-29 MED ORDER — FENTANYL CITRATE (PF) 100 MCG/2ML IJ SOLN
INTRAMUSCULAR | Status: DC | PRN
  Administered 2015-10-29: 100 ug via INTRAVENOUS

## 2015-10-29 MED ORDER — ONDANSETRON HCL 4 MG/2ML IJ SOLN
INTRAMUSCULAR | Status: DC | PRN
  Administered 2015-10-29: 4 mg via INTRAVENOUS

## 2015-10-29 MED ORDER — ONDANSETRON HCL 4 MG/2ML IJ SOLN: 4.0000 mg | Freq: Once | INTRAMUSCULAR | Status: DC | PRN

## 2015-10-29 MED ORDER — PHENYLEPHRINE HCL 10 MG/ML IJ SOLN
INTRAMUSCULAR | Status: DC | PRN
  Administered 2015-10-29: 40 ug via INTRAVENOUS
  Administered 2015-10-29 (×2): 80 ug via INTRAVENOUS

## 2015-10-29 MED ORDER — LACTATED RINGERS IV SOLN
INTRAVENOUS | Status: DC
  Administered 2015-10-29 (×2): via INTRAVENOUS

## 2015-10-29 MED ORDER — LIDOCAINE HCL (CARDIAC) 20 MG/ML IV SOLN
INTRAVENOUS | Status: DC | PRN
  Administered 2015-10-29: 80 mg via INTRAVENOUS

## 2015-10-29 MED ORDER — ONDANSETRON HCL 4 MG/2ML IJ SOLN
INTRAMUSCULAR | Status: AC
  Filled 2015-10-29: qty 2

## 2015-10-29 MED ORDER — FENTANYL CITRATE (PF) 250 MCG/5ML IJ SOLN
INTRAMUSCULAR | Status: AC
  Filled 2015-10-29: qty 5

## 2015-10-29 MED ORDER — FENTANYL CITRATE (PF) 100 MCG/2ML IJ SOLN: 25.0000 ug | INTRAMUSCULAR | Status: DC | PRN

## 2015-10-29 MED ORDER — EPHEDRINE SULFATE 50 MG/ML IJ SOLN
INTRAMUSCULAR | Status: DC | PRN
  Administered 2015-10-29 (×3): 10 mg via INTRAVENOUS

## 2015-10-29 MED ORDER — SODIUM CHLORIDE 0.9 % IR SOLN
Status: DC | PRN
  Administered 2015-10-29: 3000 mL

## 2015-10-29 MED ORDER — LIDOCAINE HCL 2 % IJ SOLN
INTRAMUSCULAR | Status: DC | PRN
  Administered 2015-10-29: 10 mL

## 2015-10-29 MED ORDER — LIDOCAINE HCL (CARDIAC) 20 MG/ML IV SOLN
INTRAVENOUS | Status: AC
  Filled 2015-10-29: qty 5

## 2015-10-29 MED ORDER — PROPOFOL 10 MG/ML IV BOLUS
INTRAVENOUS | Status: AC
  Filled 2015-10-29: qty 20

## 2015-10-29 MED ORDER — LIDOCAINE HCL 2 % IJ SOLN
INTRAMUSCULAR | Status: AC
  Filled 2015-10-29: qty 20

## 2015-10-29 SURGICAL SUPPLY — 18 items
CANISTER SUCT 3000ML (MISCELLANEOUS) ×3 IMPLANT
CATH ROBINSON RED A/P 16FR (CATHETERS) IMPLANT
CLOTH BEACON ORANGE TIMEOUT ST (SAFETY) ×3 IMPLANT
CONTAINER PREFILL 10% NBF 60ML (FORM) ×6 IMPLANT
ELECT REM PT RETURN 9FT ADLT (ELECTROSURGICAL)
ELECTRODE REM PT RTRN 9FT ADLT (ELECTROSURGICAL) IMPLANT
GLOVE BIOGEL PI IND STRL 7.0 (GLOVE) ×1 IMPLANT
GLOVE BIOGEL PI INDICATOR 7.0 (GLOVE) ×2
GLOVE ECLIPSE 6.0 STRL STRAW (GLOVE) ×6 IMPLANT
GOWN STRL REUS W/TWL LRG LVL3 (GOWN DISPOSABLE) ×6 IMPLANT
LOOP ANGLED CUTTING 22FR (CUTTING LOOP) ×3 IMPLANT
PACK VAGINAL MINOR WOMEN LF (CUSTOM PROCEDURE TRAY) ×3 IMPLANT
PAD OB MATERNITY 4.3X12.25 (PERSONAL CARE ITEMS) ×3 IMPLANT
PAD PREP 24X48 CUFFED NSTRL (MISCELLANEOUS) ×3 IMPLANT
TOWEL OR 17X24 6PK STRL BLUE (TOWEL DISPOSABLE) ×6 IMPLANT
TUBING AQUILEX INFLOW (TUBING) ×3 IMPLANT
TUBING AQUILEX OUTFLOW (TUBING) ×3 IMPLANT
WATER STERILE IRR 1000ML POUR (IV SOLUTION) ×3 IMPLANT

## 2015-10-29 NOTE — Anesthesia Procedure Notes (Signed)
Procedure Name: LMA Insertion Date/Time: 10/29/2015 10:04 AM Performed by: Casimer Lanius A Pre-anesthesia Checklist: Patient being monitored, Patient identified, Emergency Drugs available and Suction available Patient Re-evaluated:Patient Re-evaluated prior to inductionOxygen Delivery Method: Circle system utilized Preoxygenation: Pre-oxygenation with 100% oxygen Intubation Type: IV induction and Inhalational induction Ventilation: Mask ventilation without difficulty LMA: LMA inserted LMA Size: 4.0 Number of attempts: 1 Dental Injury: Teeth and Oropharynx as per pre-operative assessment

## 2015-10-29 NOTE — Op Note (Signed)
Patient Name: Sarah [name] MRN: [id number]  Date of Surgery: 10/29/2015    PREOPERATIVE DIAGNOSIS: PMB  POSTOPERATIVE DIAGNOSIS: PMB   PROCEDURE: Hysteroscopy, submucous myomectomy, endometrial polypectomy, and D&C  SURGEON: Ramonia Mcclaran D. Deatra Ina M.D.  ANESTHESIA: General  ESTIMATED BLOOD LOSS: Minimal  FINDINGS: Multiple masses in the endometrial cavity.  Scant endometrium.   INDICATIONS: Post menopausal bleeding.  PROCEDURE IN DETAIL: The patient was taken to the OR and placed in the dors-lithotomy position. The perineum and vagina were prepped and draped in a sterile fashion. Bimanual exam revealed a retroverted,normal sized uterus. 10 ml of 2% lidocaine was infiltrated in the paracervical tissue and Pratt dilators were used to open the cervix to 21 [place]. A diagnostic hysteroscope was introduced and multiple 1-2 cm polyps and small myoma were identified.  Polyp forceps were used to remove the masses.  Reinspection revealed the continued presence of 2 masses.  The cervix was further dilated to 43 [place] and the resectoscope was introduced.  The masses were removed with the small loop electode under direct vision. After the cavity was noted to be free of masses a D& C was performed. The procedure was then terminated and the patient left the operating room in good condition.

## 2015-10-29 NOTE — Transfer of Care (Signed)
Immediate Anesthesia Transfer of Care Note  Patient: Sarah [name]  Procedure(s) Performed: Procedure(s): DILATATION AND CURETTAGE /HYSTEROSCOPY with resectoscope (N/A) CERVICAL POLYPECTOMY (N/A)  Patient Location: PACU  Anesthesia Type:General  Level of Consciousness: awake  Airway & Oxygen Therapy: Patient Spontanous Breathing  Post-op Assessment: Report given to PACU RN  Post vital signs: stable  Filed Vitals:   10/29/15 0921  BP: 144/79  Pulse: 97  Temp: 36.8 C  Resp: 16    Complications: No apparent anesthesia complications

## 2015-10-29 NOTE — Discharge Instructions (Signed)
Hysteroscopy, Care After Refer to this sheet in the next few weeks. These instructions provide you with information on caring for yourself after your procedure. Your health care provider may also give you more specific instructions. Your treatment has been planned according to current medical practices, but problems sometimes occur. Call your health care provider if you have any problems or questions after your procedure.  WHAT TO EXPECT AFTER THE PROCEDURE After your procedure, it is typical to have the following:  You may have some cramping. This normally lasts for a couple days.  You may have bleeding. This can vary from light spotting for a few days to menstrual-like bleeding for 3-7 days. HOME CARE INSTRUCTIONS  Rest for the first 1-2 days after the procedure.  Only take over-the-counter or prescription medicines as directed by your health care provider. Do not take aspirin. It can increase the chances of bleeding.  Take showers instead of baths for 2 weeks or as directed by your health care provider.  Do not drive for 24 hours or as directed.  Do not drink alcohol while taking pain medicine.  Do not use tampons, douche, or have sexual intercourse for 2 weeks or until your health care provider says it is okay.  Take your temperature twice a day for 4-5 days. Write it down each time.  Follow your health care provider's advice about diet, exercise, and lifting.  If you develop constipation, you may:  Take a mild laxative if your health care provider approves.  Add bran foods to your diet.  Drink enough fluids to keep your urine clear or pale yellow.  Try to have someone with you or available to you for the first 24-48 hours, especially if you were given a general anesthetic.  Follow up with your health care provider as directed. Call Dr. Deatra Ina [phone]) If you are concerned about any of these things:  You feel dizzy or lightheaded.  You feel sick to your stomach  (nauseous).  You have abnormal vaginal discharge.  You have a rash.  You have pain that is not controlled with medicine.  You have bleeding that is heavier than a normal menstrual period.  You have a fever.  You have increasing cramps or pain, not controlled with medicine.  You have new belly (abdominal) pain.  You pass out.  You have pain in the tops of your shoulders (shoulder strap areas).  You have shortness of breath.   This information is not intended to replace advice given to you by your health care provider. Make sure you discuss any questions you have with your health care provider.   Document Released: 04/20/2013 Document Reviewed: 04/20/2013 Elsevier Interactive Patient Education 2016 [place]: D&C / D&E The following instructions have been prepared to help you care for yourself upon your return home.   Personal hygiene:  Use sanitary pads for vaginal drainage, not tampons.  Shower the day after your procedure.  NO tub baths, pools or Jacuzzis for 2-3 weeks.  Wipe front to back after using the bathroom.  Activity and limitations:  Do NOT drive or operate any equipment for 24 hours. The effects of anesthesia are still present and drowsiness may result.  Do NOT rest in bed all day.  Walking is encouraged.  Walk up and down stairs slowly.  You may resume your normal activity in one to two days or as indicated by your physician.  Sexual activity: NO intercourse for at least 2 weeks after the procedure,  or as indicated by your physician.  Diet: Eat a light meal as desired this evening. You may resume your usual diet tomorrow.  Return to work: You may resume your work activities in one to two days or as indicated by your doctor.  What to expect after your surgery: Expect to have vaginal bleeding/discharge for 2-3 days and spotting for up to 10 days. It is not unusual to have soreness for up to 1-2 weeks. You may have a slight  burning sensation when you urinate for the first day. Mild cramps may continue for a couple of days. You may have a regular period in 2-6 weeks.  Call your doctor for any of the following:  Excessive vaginal bleeding, saturating and changing one pad every hour.  Inability to urinate 6 hours after discharge from hospital.  Pain not relieved by pain medication.  Fever of 100.4 F or greater.  Unusual vaginal discharge or odor.   Call for an appointment:    Patients signature: ______________________  Nurses signature ________________________  Support person's signature_______________________

## 2015-10-29 NOTE — Progress Notes (Signed)
I have interviewed and performed the pertinent exams on my patient to confirm that there have been no significant changes in her condition since the dictation of her history and physical exam.  

## 2015-10-29 NOTE — Anesthesia Preprocedure Evaluation (Addendum)
Anesthesia Evaluation  Patient identified by MRN, date of birth, ID band Patient awake    Reviewed: Allergy & Precautions, H&P , NPO status , Patient's Chart, lab work & pertinent test results  History of Anesthesia Complications Negative for: history of anesthetic complications  Airway Mallampati: II  TM Distance: >3 FB Neck ROM: full    Dental  (+) Edentulous Lower, Edentulous Upper   Pulmonary neg pulmonary ROS,    Pulmonary exam normal breath sounds clear to auscultation       Cardiovascular hypertension, Pt. on medications negative cardio ROS Normal cardiovascular exam Rhythm:regular Rate:Normal  2009 Echo - normal EF, no AV stenosis   Neuro/Psych Anxiety negative neurological ROS     GI/Hepatic negative GI ROS, Neg liver ROS,   Endo/Other  Morbid obesity  Renal/GU negative Renal ROS     Musculoskeletal   Abdominal   Peds  Hematology   Anesthesia Other Findings   Reproductive/Obstetrics negative OB ROS                           Anesthesia Physical Anesthesia Plan  ASA: II  Anesthesia Plan: General   Post-op Pain Management:    Induction: Intravenous  Airway Management Planned: LMA  Additional Equipment:   Intra-op Plan:   Post-operative Plan: Extubation in OR  Informed Consent: I have reviewed the patients History and Physical, chart, labs and discussed the procedure including the risks, benefits and alternatives for the proposed anesthesia with the patient or authorized representative who has indicated his/her understanding and acceptance.   Dental Advisory Given  Plan Discussed with: Anesthesiologist, CRNA and Surgeon  Anesthesia Plan Comments:         Anesthesia Quick Evaluation

## 2015-10-29 NOTE — Anesthesia Postprocedure Evaluation (Signed)
Anesthesia Post Note  Patient: Sarah [name]  Procedure(s) Performed: Procedure(s) (LRB): DILATATION AND CURETTAGE /HYSTEROSCOPY with resectoscope (N/A) CERVICAL POLYPECTOMY (N/A)  Patient location during evaluation: PACU Anesthesia Type: General Level of consciousness: awake Pain management: pain level controlled Vital Signs Assessment: post-procedure vital signs reviewed and stable Respiratory status: spontaneous breathing Cardiovascular status: stable Postop Assessment: no signs of nausea or vomiting Anesthetic complications: no    Last Vitals:  Filed Vitals:   10/29/15 1151 10/29/15 1220  BP: 124/95 137/80  Pulse: 80 82  Temp: 36.3 C   Resp: 15 18    Last Pain: There were no vitals filed for this visit.               [place]

## 2015-10-30 ENCOUNTER — Encounter (HOSPITAL_COMMUNITY): Payer: Self-pay | Admitting: Obstetrics & Gynecology

## 2015-11-01 ENCOUNTER — Other Ambulatory Visit: Payer: Self-pay

## 2015-11-01 NOTE — Telephone Encounter (Signed)
Requesting the nurse to call back.

## 2015-11-01 NOTE — Telephone Encounter (Signed)
Spoke w/ pt, she states the duloxetine is too expensive, co pay is appr $31.00, [facility] and they state it is 31.00, 90 days worth would be slightly cheaper but pt would like to know if there is something similar that you could order that is cheaper. Please advise

## 2015-11-02 ENCOUNTER — Other Ambulatory Visit: Payer: Self-pay | Admitting: Internal Medicine

## 2015-11-02 MED ORDER — VENLAFAXINE HCL ER 37.5 MG PO CP24: 37.5000 mg | ORAL_CAPSULE | Freq: Every day | ORAL | Status: DC

## 2015-11-02 NOTE — Telephone Encounter (Signed)
I put her in for effexor. If this is too expensive please let me know and I will speak to Dr. Maudie Mercury and see which medication will work out cheaper. Thank you

## 2015-11-05 NOTE — Telephone Encounter (Signed)
Pt informed

## 2015-11-08 ENCOUNTER — Telehealth: Payer: Self-pay | Admitting: Internal Medicine

## 2015-11-08 NOTE — Telephone Encounter (Signed)
Call from [facility]-wanted to know if patient was on both cymbalta and effexor.  Per pt's medical record-she was on cymbalta but was unable to afford the $30 copay. Patient requested a lower price alternative.  PCP wrote for effexor, which has a copay of $8 for one mth supply. Pharmacy instructed to d/c the cymbalta rx.  Phone call complete.Despina Hidden Cassady4/27/20178:52 AM

## 2015-11-08 NOTE — Telephone Encounter (Signed)
Needs to clarify rx. Call sent to triage

## 2015-11-09 ENCOUNTER — Other Ambulatory Visit: Payer: Self-pay | Admitting: Internal Medicine

## 2015-11-09 NOTE — Telephone Encounter (Signed)
Last appt 3/7; no f/u appt.

## 2015-11-20 ENCOUNTER — Other Ambulatory Visit: Payer: Self-pay | Admitting: Internal Medicine

## 2015-12-29 ENCOUNTER — Other Ambulatory Visit: Payer: Self-pay | Admitting: Internal Medicine

## 2016-01-28 NOTE — Telephone Encounter (Signed)
PA was denied, pt was made aware of the time of denial.

## 2016-02-09 ENCOUNTER — Other Ambulatory Visit: Payer: Self-pay | Admitting: Internal Medicine

## 2016-02-18 DIAGNOSIS — Z1231 Encounter for screening mammogram for malignant neoplasm of breast: Secondary | ICD-10-CM | POA: Diagnosis not present

## 2016-03-25 ENCOUNTER — Other Ambulatory Visit: Payer: Self-pay | Admitting: Internal Medicine

## 2016-03-27 ENCOUNTER — Other Ambulatory Visit: Payer: Self-pay | Admitting: *Deleted

## 2016-03-27 MED ORDER — VENLAFAXINE HCL ER 37.5 MG PO CP24: 37.5000 mg | ORAL_CAPSULE | Freq: Every day | ORAL | 3 refills | Status: DC

## 2016-04-02 ENCOUNTER — Other Ambulatory Visit: Payer: Self-pay | Admitting: Internal Medicine

## 2016-05-01 ENCOUNTER — Other Ambulatory Visit: Payer: Self-pay | Admitting: Internal Medicine

## 2016-05-06 ENCOUNTER — Telehealth: Payer: Self-pay | Admitting: Internal Medicine

## 2016-05-06 NOTE — Telephone Encounter (Signed)
APT. REMINDER CALL, LMTCB °

## 2016-05-07 ENCOUNTER — Ambulatory Visit (INDEPENDENT_AMBULATORY_CARE_PROVIDER_SITE_OTHER): Payer: Medicare Other | Admitting: Internal Medicine

## 2016-05-07 ENCOUNTER — Encounter: Payer: Self-pay | Admitting: Licensed Clinical Social Worker

## 2016-05-07 DIAGNOSIS — F332 Major depressive disorder, recurrent severe without psychotic features: Secondary | ICD-10-CM | POA: Insufficient documentation

## 2016-05-07 DIAGNOSIS — F322 Major depressive disorder, single episode, severe without psychotic features: Secondary | ICD-10-CM | POA: Diagnosis not present

## 2016-05-07 DIAGNOSIS — F32A Depression, unspecified: Secondary | ICD-10-CM | POA: Insufficient documentation

## 2016-05-07 DIAGNOSIS — F329 Major depressive disorder, single episode, unspecified: Secondary | ICD-10-CM | POA: Insufficient documentation

## 2016-05-07 MED ORDER — VENLAFAXINE HCL ER 37.5 MG PO CP24: 75.0000 mg | ORAL_CAPSULE | Freq: Every day | ORAL | 3 refills | Status: DC

## 2016-05-07 NOTE — Progress Notes (Signed)
[name] was referred to [facility] by nursing staff as patient voiced difficulty provided 24hr care to her spouse.  CSW met with [name] during her scheduled [facility] appointment.  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  Pt states spouse is s/p CVA and unable to independently perform his ADL's.   CSW met with [name], pt tearful as she spoke of the care she provides for her spouse.  [name] complains of being tired all the time after providing his care and cleaning the home.  Pt and spouse unable to go out and "enjoy" dinner as [name] states she has to cut up off of his meals.  Pt states family goal is to keep spouse in the home, placement is not an option.  [name] inquiring about programs that would be able to come into the home and assist with cleaning the home at least once a week.  CSW discuss most program will provide the hands-on care to spouse, but for the housekeeping pt will need to explore cleaning agencies.  [name] agreeable to caregiver assistance. CSW discussed the [facility] program and [facility] programs.  [name] not interested in [facility] as pt/spouse do not want to change PCP.  Brochure on [facility] provided and [name] informed of possible waiting list, pt declines private pay listing.  CSW also provided [name] with information on [facility] adult day care program and in-home assistance.  Pt declines additional social work needs at this time.  [name] aware CSW is available to assist as needed.

## 2016-05-07 NOTE — Assessment & Plan Note (Addendum)
Depression screen [facility] 2/9 05/07/2016 09/18/2015 04/13/2015 08/31/2014 08/31/2014  Decreased Interest 3 0 3 1 0  Down, Depressed, Hopeless 3 0 3 1 0  PHQ - 2 Score 6 0 6 2 0  Altered sleeping 3 - 3 3 -  Tired, decreased energy 3 - 3 3 -  Change in appetite 3 - 3 1 -  Feeling bad or failure about yourself  3 - 0 0 -  Trouble concentrating 3 - 3 0 -  Moving slowly or fidgety/restless 3 - 3 3 -  Suicidal thoughts 0 - 0 0 -  PHQ-9 Score 24 - 21 12 -  Difficult doing work/chores Somewhat difficult - - - -  Some recent data might be hidden    Current medications: Venlafaxine 37.5 mg daily, Trazodone 100 mg QHS  Previous medications:  Assessment Uncontrolled depression with social stressor of 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  Chronicity exceeds adjustment disorder.  Plan  Medications: Increase Venlafaxine to 75 mg daily (37.5 for one week, then 75). -Defer to Dr. Dareen Piano regarding continuation of Trazodone and Valium  Other:  -F/u with PCP -Make appointment for husband to investigate home health options

## 2016-05-07 NOTE — Progress Notes (Signed)
   CC: I need help at home with my husband.  HPI:  Sarah [name] is a 70 y.o. woman with history of HTN, HL, and chronic pain who presents for management of depression.  She has sole responsibility for taking care of husband, who has had multiple remote strokes, and has no help from family or friends.  She says she eats and sleeps poorly, is tearful all the time, and has no time or energy to take care of herself.  These feelings are chronic.  She denies suicidal ideation and says she loves herself, but needs help in order to have time to take care of herself.  She was prescribed Cymbalta but never filled the prescription because she could not afford it.  Then she was prescribed Effexor, which she took for a month until she ran out 1 week ago.  She did not receive a refill from her mail order pharmacy and is not sure why.  She has also been taking Trazodone at night, and occasional Valium.   Past Medical History:  Diagnosis Date  . Abnormal electrocardiogram   . Anxiety   . Cellulitis of left foot   . Dystrophic nail   . High cholesterol   . Hypertension   . Obesity   . Peripheral edema   . Postmenopausal status     Review of Systems:  Review of Systems  Respiratory: Negative for shortness of breath.   Cardiovascular: Negative for chest pain.  Psychiatric/Behavioral: Positive for depression. Negative for suicidal ideas. The patient has insomnia.      Physical Exam:  Vitals:   05/07/16 1034  BP: (!) 124/49  Pulse: 83  Temp: 98 F (36.7 C)  TempSrc: Oral  SpO2: 99%  Weight: 235 lb (106.6 kg)  Height: 5\' 7"  (1.702 m)   Physical Exam  Constitutional: She is oriented to person, place, and time.  Tearful, emotional woman.  Well dressed and groomed.  Cardiovascular: Normal rate and regular rhythm.   Pulmonary/Chest: Effort normal and breath sounds normal.  Neurological: She is alert and oriented to person, place, and time.  Psychiatric:  Mood depressed, affect tearful  and emotive Denies SI/HI    Assessment & Plan:   See Encounters Tab for problem based charting.  Patient seen with Dr. Evette Doffing

## 2016-05-07 NOTE — Patient Instructions (Addendum)
We talked today about feeling overwhelmed and depressed.  The medicine, venlafaxine (Effexor) that you were taking is a good medicine for you right now.  I would like you to order those refills as soon as possible.  When you get the medicine, take 1 capsule daily for a week, then go up to 2 capsules.  We will get you an appointment to see Dr Dareen Piano in a few weeks.  Please call to make an appointment for your husband to see his doctor ASAP to see what resources are available to help him at home.

## 2016-05-08 NOTE — Progress Notes (Signed)
Internal Medicine Clinic Attending  I saw and evaluated the patient.  I personally confirmed the key portions of the history and exam documented by Dr. O'Sullivan and I reviewed pertinent patient test results.  The assessment, diagnosis, and plan were formulated together and I agree with the documentation in the resident's note.   

## 2016-05-14 ENCOUNTER — Other Ambulatory Visit: Payer: Self-pay | Admitting: Internal Medicine

## 2016-05-21 ENCOUNTER — Encounter: Payer: Self-pay | Admitting: Internal Medicine

## 2016-05-21 ENCOUNTER — Ambulatory Visit (INDEPENDENT_AMBULATORY_CARE_PROVIDER_SITE_OTHER): Payer: Medicare Other | Admitting: Internal Medicine

## 2016-05-21 VITALS — BP 136/55 | HR 73 | Temp 98.0°F | Wt 235.0 lb

## 2016-05-21 DIAGNOSIS — Z23 Encounter for immunization: Secondary | ICD-10-CM | POA: Diagnosis not present

## 2016-05-21 DIAGNOSIS — F322 Major depressive disorder, single episode, severe without psychotic features: Secondary | ICD-10-CM | POA: Diagnosis not present

## 2016-05-21 DIAGNOSIS — Z0000 Encounter for general adult medical examination without abnormal findings: Secondary | ICD-10-CM

## 2016-05-21 MED ORDER — VENLAFAXINE HCL ER 150 MG PO CP24: 150.0000 mg | ORAL_CAPSULE | Freq: Every day | ORAL | 3 refills | Status: DC

## 2016-05-21 NOTE — Assessment & Plan Note (Signed)
Flu shot today 

## 2016-05-21 NOTE — Progress Notes (Signed)
   CC: Depression  HPI:  [name] is a 70 y.o. woman with PMHx as noted below who presents today for follow up of her depression.  She was seen in clinic about 2 weeks ago for follow up of her depression. 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 Her Effexor was increased to 75 mg daily at that visit.  Today, she reports not feeling much different than last visit. She reports becoming tearful daily, poor sleep, and poor appetite. She notes recent deaths of her mother and youngest son which have been difficult for her. She feels overwhelmed by taking care of her husband and having to keep up with household chores and bills. She does not have any family or friends that can help. She does go to church at least 3 times weekly and finds comfort in that but does not want people from church to know her personal business and suffering so has not reached out to anyone there. She has discussed with her husband about going to an adult daycare facility or having other services assist in his care but he is resistant to this idea. She states she can keep up with her husband's needs but what she needs help with is household upkeep. She denies any suicidal ideation. She has been taking Effexor XR 75 mg daily and Trazodone 100 mg daily at bedtime.   Past Medical History:  Diagnosis Date  . Abnormal electrocardiogram   . Anxiety   . Cellulitis of left foot   . Dystrophic nail   . High cholesterol   . Hypertension   . Obesity   . Peripheral edema   . Postmenopausal status     Review of Systems:  All negative except per HPI  Physical Exam:  Vitals:   05/21/16 1039  BP: (!) 136/55  Pulse: 73  Temp: 98 F (36.7 C)  TempSrc: Oral  SpO2: 99%  Weight: 235 lb (106.6 kg)   General: well-nourished woman sitting up, pleasant, tearful HEENT: [place]/AT, EOMI, sclera anicteric, mucus membranes moist CV:  RRR, no m/g/r Pulm: CTA bilaterally, breaths non-labored Abd: BS+, soft, obese, non-tender Ext: warm, no peripheral edema  Neuro: alert and oriented x 3 Psych: appropriate speech and thought content. Depressed mood. Tearful at times.   Assessment & Plan:   See Encounters Tab for problem based charting.  Patient discussed with Dr. Evette Doffing

## 2016-05-21 NOTE — Patient Instructions (Signed)
General Instructions: - Increase Effexor to 150 mg daily - Follow up in 4 weeks  Please bring your medicines with you each time you come to clinic.  Medicines may include prescription medications, over-the-counter medications, herbal remedies, eye drops, vitamins, or other pills.   Progress Toward Treatment Goals:  Treatment Goal 08/31/2014  Blood pressure at goal  Prevent falls at goal    Self Care Goals & Plans:  Self Care Goal 09/18/2015  Manage my medications take my medicines as prescribed; bring my medications to every visit; refill my medications on time  Monitor my health -  Eat healthy foods drink diet soda or water instead of juice or soda; eat more vegetables; eat foods that are low in salt; eat baked foods instead of fried foods; eat fruit for snacks and desserts  Be physically active find an activity I enjoy  Meeting treatment goals maintain the current self-care plan    No flowsheet data found.   Care Management & Community Referrals:  Referral 06/13/2014  Referrals made for care management support social worker  Referrals made to community resources none

## 2016-05-21 NOTE — Assessment & Plan Note (Signed)
Despite the patient feeling that the Effexor has not improved her depression symptoms, her PHQ-9 has improved from 24 to 14. This is still not adequately controlled and she continues to feel depressed daily and with poor appetite and sleep. Will increase her Effexor XR to 150 mg daily with goal of getting PHQ-9 at least below 10. She had asked about getting Valium "to help through tough times" but we discussed that the best way to treat her depression is with the Effexor. I also recommended to continue going to church and getting support there. She plans on going back to the gym and I strongly encouraged this idea. Will have her follow up in 4 weeks to reassess her depression.

## 2016-05-22 NOTE — Progress Notes (Signed)
Internal Medicine Clinic Attending  Case discussed with Dr. Rivet at the time of the visit.  We reviewed the resident's history and exam and pertinent patient test results.  I agree with the assessment, diagnosis, and plan of care documented in the resident's note.  

## 2016-06-19 ENCOUNTER — Other Ambulatory Visit: Payer: Self-pay | Admitting: Internal Medicine

## 2016-07-29 ENCOUNTER — Encounter: Payer: Self-pay | Admitting: Internal Medicine

## 2016-11-05 ENCOUNTER — Telehealth: Payer: Self-pay | Admitting: *Deleted

## 2016-11-05 NOTE — Telephone Encounter (Signed)
Received refill requests from [facility] ( [facility]) - called pt to see if she's changing pharmacies; [facility] and [facility] are on her chart.

## 2016-11-10 MED ORDER — ATORVASTATIN CALCIUM 10 MG PO TABS: 10.0000 mg | ORAL_TABLET | Freq: Every day | ORAL | 0 refills | Status: DC

## 2016-11-10 MED ORDER — VENLAFAXINE HCL ER 150 MG PO CP24: 150.0000 mg | ORAL_CAPSULE | Freq: Every day | ORAL | 0 refills | Status: DC

## 2016-11-10 MED ORDER — HYDROCHLOROTHIAZIDE 12.5 MG PO TABS: 12.5000 mg | ORAL_TABLET | Freq: Every day | ORAL | 0 refills | Status: DC

## 2016-11-10 MED ORDER — AMLODIPINE BESYLATE 2.5 MG PO TABS: 2.5000 mg | ORAL_TABLET | Freq: Every day | ORAL | 0 refills | Status: DC

## 2016-11-10 NOTE — Telephone Encounter (Signed)
I called pt so she can make an appt - call transferred to front desk. Chilon stated she offered her an appt for tommorow; pt refused, stated she has class. Informed Dr Dareen Piano will not have any openings until July. Also offered to see someone else - only want to see PCP.Stated she will call back.

## 2016-11-10 NOTE — Telephone Encounter (Signed)
Pt states she is using [facility] b/c she had changed insurance.

## 2016-11-10 NOTE — Telephone Encounter (Signed)
Thank you Glenda... 

## 2017-02-17 ENCOUNTER — Other Ambulatory Visit: Payer: Self-pay | Admitting: *Deleted

## 2017-02-17 ENCOUNTER — Ambulatory Visit (INDEPENDENT_AMBULATORY_CARE_PROVIDER_SITE_OTHER): Payer: PPO | Admitting: Internal Medicine

## 2017-02-17 VITALS — BP 142/65 | HR 70 | Temp 98.3°F | Wt 229.5 lb

## 2017-02-17 DIAGNOSIS — R1319 Other dysphagia: Secondary | ICD-10-CM

## 2017-02-17 DIAGNOSIS — F329 Major depressive disorder, single episode, unspecified: Secondary | ICD-10-CM

## 2017-02-17 DIAGNOSIS — I10 Essential (primary) hypertension: Secondary | ICD-10-CM | POA: Diagnosis not present

## 2017-02-17 DIAGNOSIS — R5383 Other fatigue: Secondary | ICD-10-CM | POA: Insufficient documentation

## 2017-02-17 DIAGNOSIS — Z79899 Other long term (current) drug therapy: Secondary | ICD-10-CM

## 2017-02-17 DIAGNOSIS — Z8249 Family history of ischemic heart disease and other diseases of the circulatory system: Secondary | ICD-10-CM

## 2017-02-17 DIAGNOSIS — R1310 Dysphagia, unspecified: Secondary | ICD-10-CM | POA: Diagnosis not present

## 2017-02-17 DIAGNOSIS — R5382 Chronic fatigue, unspecified: Secondary | ICD-10-CM | POA: Diagnosis not present

## 2017-02-17 DIAGNOSIS — Z634 Disappearance and death of family member: Secondary | ICD-10-CM

## 2017-02-17 DIAGNOSIS — F322 Major depressive disorder, single episode, severe without psychotic features: Secondary | ICD-10-CM

## 2017-02-17 MED ORDER — VENLAFAXINE HCL ER 150 MG PO CP24: 150.0000 mg | ORAL_CAPSULE | Freq: Every day | ORAL | 0 refills | Status: DC

## 2017-02-17 MED ORDER — AMLODIPINE BESYLATE 2.5 MG PO TABS: 2.5000 mg | ORAL_TABLET | Freq: Every day | ORAL | 0 refills | Status: DC

## 2017-02-17 MED ORDER — HYDROCHLOROTHIAZIDE 12.5 MG PO TABS: 12.5000 mg | ORAL_TABLET | Freq: Every day | ORAL | 0 refills | Status: DC

## 2017-02-17 MED ORDER — ATORVASTATIN CALCIUM 10 MG PO TABS: 10.0000 mg | ORAL_TABLET | Freq: Every day | ORAL | 0 refills | Status: DC

## 2017-02-17 NOTE — Assessment & Plan Note (Signed)
She was complaining of esophageal dysphagia for solids. It appears psychogenic, as it get worse under stressful conditions. She needs to rule out any organic pathology.  She was given a referral for gastroenterology.
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
## 2017-02-17 NOTE — Assessment & Plan Note (Signed)
Her complaint of chronic fatigue is most likely because of her abnormal grieving and depression.  In order to rule out any organic cause we will check CBC, CMP and TSH.

## 2017-02-17 NOTE — Assessment & Plan Note (Signed)
BP Readings from Last 3 Encounters:  02/17/17 (!) 142/65  05/21/16 (!) 136/55  05/07/16 (!) 124/49   She was having mildly elevated blood pressure today. She was very anxious and tearful because of her current circumstances.  Continue with the current management, reassess  during next follow-up visit when she is more relaxed.  A new refill was provided for her amlodipine and hydrochlorothiazide.

## 2017-02-17 NOTE — Patient Instructions (Addendum)
Thank you for visiting clinic today. We are doing some lab work today, I will call you with any abnormal results. You can try using some over-the-counter B complex and see if it will help with your energy. As we discussed you're feeling of low in energy is most likely because of your ongoing stressful circumstances. I am sending you to a psychologist for psychotherapy to help. If your insurance refused to pay for those visits, you can call hospice services and they will provide some grief counseling without any cost. I'm also giving you a referral to see a gastroenterologist because of your complains that food is getting stuck in your chest. Please follow-up with your PCP within a month.

## 2017-02-18 LAB — CBC
Hematocrit: 36.5 % (ref 34.0–46.6)
Hemoglobin: 12 g/dL (ref 11.1–15.9)
MCH: 29 pg (ref 26.6–33.0)
MCHC: 32.9 g/dL (ref 31.5–35.7)
MCV: 88 fL (ref 79–97)
PLATELETS: 277 10*3/uL (ref 150–379)
RBC: 4.14 x10E6/uL (ref 3.77–5.28)
RDW: 14.9 % (ref 12.3–15.4)
WBC: 6.1 10*3/uL (ref 3.4–10.8)

## 2017-02-18 LAB — CMP14 + ANION GAP
ALT: 8 IU/L (ref 0–32)
AST: 11 IU/L (ref 0–40)
Albumin/Globulin Ratio: 1.5 (ref 1.2–2.2)
Albumin: 4.3 g/dL (ref 3.5–4.8)
Alkaline Phosphatase: 99 IU/L (ref 39–117)
Anion Gap: 12 mmol/L (ref 10.0–18.0)
BILIRUBIN TOTAL: 0.3 mg/dL (ref 0.0–1.2)
BUN / CREAT RATIO: 8 — AB (ref 12–28)
BUN: 7 mg/dL — ABNORMAL LOW (ref 8–27)
CALCIUM: 9.4 mg/dL (ref 8.7–10.3)
CHLORIDE: 103 mmol/L (ref 96–106)
CO2: 27 mmol/L (ref 20–29)
Creatinine, Ser: 0.84 mg/dL (ref 0.57–1.00)
GFR, EST AFRICAN AMERICAN: 81 mL/min/{1.73_m2} (ref >59)
GFR, EST NON AFRICAN AMERICAN: 71 mL/min/{1.73_m2} (ref >59)
GLOBULIN, TOTAL: 2.9 g/dL (ref 1.5–4.5)
Glucose: 84 mg/dL (ref 65–99)
POTASSIUM: 4.4 mmol/L (ref 3.5–5.2)
SODIUM: 142 mmol/L (ref 134–144)
TOTAL PROTEIN: 7.2 g/dL (ref 6.0–8.5)

## 2017-02-18 LAB — TSH: TSH: 4.14 u[IU]/mL (ref 0.450–4.500)

## 2017-02-18 MED ORDER — GABAPENTIN 300 MG PO CAPS: ORAL_CAPSULE | ORAL | 3 refills | Status: DC

## 2017-02-18 MED ORDER — ATORVASTATIN CALCIUM 10 MG PO TABS: 10.0000 mg | ORAL_TABLET | Freq: Every day | ORAL | 0 refills | Status: DC

## 2017-02-18 MED ORDER — AMLODIPINE BESYLATE 2.5 MG PO TABS: 2.5000 mg | ORAL_TABLET | Freq: Every day | ORAL | 0 refills | Status: DC

## 2017-02-18 MED ORDER — VENLAFAXINE HCL ER 150 MG PO CP24: 150.0000 mg | ORAL_CAPSULE | Freq: Every day | ORAL | 0 refills | Status: DC

## 2017-02-18 MED ORDER — HYDROCHLOROTHIAZIDE 12.5 MG PO TABS: 12.5000 mg | ORAL_TABLET | Freq: Every day | ORAL | 0 refills | Status: DC

## 2017-02-18 MED ORDER — PSYLLIUM 28 % PO PACK: PACK | ORAL | 3 refills | Status: DC

## 2017-02-20 ENCOUNTER — Other Ambulatory Visit: Payer: Self-pay | Admitting: Internal Medicine

## 2017-02-20 ENCOUNTER — Telehealth (HOSPITAL_COMMUNITY): Payer: Self-pay

## 2017-02-20 NOTE — Telephone Encounter (Signed)
REFILL ON VENLAFAXINE 150MG  NEEDS EXTRA WEEK SUPPLY GOING ON VACATION, TO [facility]

## 2017-02-23 MED ORDER — VENLAFAXINE HCL ER 150 MG PO CP24: 150.0000 mg | ORAL_CAPSULE | Freq: Every day | ORAL | 0 refills | Status: DC

## 2017-02-23 NOTE — Progress Notes (Signed)
Internal Medicine Clinic Attending  Case discussed with Dr. Amin at the time of the visit.  We reviewed the resident's history and exam and pertinent patient test results.  I agree with the assessment, diagnosis, and plan of care documented in the resident's note.    

## 2017-03-02 ENCOUNTER — Encounter: Payer: Self-pay | Admitting: *Deleted

## 2017-03-04 ENCOUNTER — Encounter: Payer: Self-pay | Admitting: *Deleted

## 2017-03-17 ENCOUNTER — Telehealth (HOSPITAL_COMMUNITY): Payer: Self-pay | Admitting: Psychiatry

## 2017-03-17 NOTE — Telephone Encounter (Signed)
A:  Called pt to offer MH-IOP (group @ [facility]), but there was no answer.  Left generic vm for pt to call writer back.  Dellia Nims, M.Ed, CNA

## 2017-03-24 ENCOUNTER — Encounter: Payer: Self-pay | Admitting: Internal Medicine

## 2017-03-24 ENCOUNTER — Ambulatory Visit (INDEPENDENT_AMBULATORY_CARE_PROVIDER_SITE_OTHER): Payer: PPO | Admitting: Internal Medicine

## 2017-03-24 VITALS — BP 125/67 | HR 91 | Temp 98.2°F | Ht 66.0 in | Wt 229.8 lb

## 2017-03-24 DIAGNOSIS — Z23 Encounter for immunization: Secondary | ICD-10-CM | POA: Diagnosis not present

## 2017-03-24 DIAGNOSIS — F322 Major depressive disorder, single episode, severe without psychotic features: Secondary | ICD-10-CM

## 2017-03-24 DIAGNOSIS — N950 Postmenopausal bleeding: Secondary | ICD-10-CM

## 2017-03-24 DIAGNOSIS — I10 Essential (primary) hypertension: Secondary | ICD-10-CM | POA: Diagnosis not present

## 2017-03-24 DIAGNOSIS — R1310 Dysphagia, unspecified: Secondary | ICD-10-CM

## 2017-03-24 DIAGNOSIS — R1319 Other dysphagia: Secondary | ICD-10-CM

## 2017-03-24 DIAGNOSIS — Z0000 Encounter for general adult medical examination without abnormal findings: Secondary | ICD-10-CM

## 2017-03-24 DIAGNOSIS — E559 Vitamin D deficiency, unspecified: Secondary | ICD-10-CM

## 2017-03-24 MED ORDER — VENLAFAXINE HCL ER 150 MG PO CP24: 300.0000 mg | ORAL_CAPSULE | Freq: Every day | ORAL | 0 refills | Status: DC

## 2017-03-24 NOTE — Patient Instructions (Signed)
-   It was a pleasure seeing you today - I will increase your effexor to 225 mg (1 1/2 tablets a day) - Please go to a support group. I think it will help you - We will give you a flu shot today  - We will check your blood work including vitamin D level on your next visit - Please call for your mammogram and Pap smear appointments\ - Take care of yourself

## 2017-03-25 NOTE — Assessment & Plan Note (Signed)
-   Flu shot given today - Patient states that she will follow up for her mammogram and Pap smear at [facility] and will make her own appointment

## 2017-03-25 NOTE — Assessment & Plan Note (Signed)
-   This problem has now resolved - Patient denies any further episodes of bleeding after her D&C and cervical polypectomy done last year - No further workup for now

## 2017-03-25 NOTE — Assessment & Plan Note (Addendum)
BP Readings from Last 3 Encounters:  03/24/17 125/67  02/17/17 (!) 142/65  05/21/16 (!) 136/55    Lab Results  Component Value Date   NA 142 02/17/2017   K 4.4 02/17/2017   CREATININE 0.84 02/17/2017    Assessment: Blood pressure control:  well-controlled Progress toward BP goal:   at goal Comments: Patient is compliant with hydrochlorothiazide 12.5 mg daily and amlodipine 2.5 mg daily  Plan: Medications:  continue current medications Educational resources provided: brochure (denies need ) Self management tools provided:   Other plans:

## 2017-03-25 NOTE — Assessment & Plan Note (Signed)
-   Patient with a history of vitamin D deficiency -She is treated with an 8 week course of vitamin D and her vitamin D levels normalized (March 2017) - We will recheck her vitamin D levels on her follow-up visit

## 2017-03-25 NOTE — Assessment & Plan Note (Addendum)
-   Patient is very emotional today and states that a lot of her depression stems from her being the sole caregiver for her husband - Patient states that the Effexor is helping but only a little. We will increase her Effexor dose to 300 mg daily. - Emotional support provided - Patient to follow-up in 3 months - She refuses referral to psychiatry or or psychology at this time - She does state that she will seek out a support group and feels that this is more beneficial for her

## 2017-03-25 NOTE — Assessment & Plan Note (Signed)
-   Patient states that this problem is now resolved - She has no difficulty swallowing at this time and is tolerating her food - She attributes a lot of her symptoms to her depression and states that if this problem recurs she will let us know and follow up with GI

## 2017-03-25 NOTE — Progress Notes (Signed)
   Subjective:    Patient ID: Sarah [name], female    DOB: 1945/12/18, 71 y.o.   MRN: [id number]  HPI  I have seen and examined this patient. This patient is here for routine follow-up of her hypertension and depression.  Patient is very emotional today and states that her depression is secondary to her home situation where she is a sole caregiver to her husband. She denies any other acute complaints at this time and states he is compliant with her medications.  Review of Systems  Constitutional: Negative.   HENT: Negative.   Respiratory: Negative.   Cardiovascular: Negative.   Gastrointestinal: Negative.   Musculoskeletal: Negative.   Neurological: Negative.   Psychiatric/Behavioral: Negative.        Objective:   Physical Exam  Constitutional: She is oriented to person, place, and time. She appears well-developed and well-nourished.  HENT:  Head: Normocephalic and atraumatic.  Cardiovascular: Normal rate, regular rhythm and normal heart sounds.   Pulmonary/Chest: Effort normal and breath sounds normal. No respiratory distress. She has no wheezes.  Abdominal: Soft. Bowel sounds are normal. She exhibits no distension. There is no tenderness.  Musculoskeletal: Normal range of motion. She exhibits no edema or tenderness.  Neurological: She is alert and oriented to person, place, and time.  Skin: Skin is warm. No rash noted. No erythema.  Psychiatric: Her behavior is normal. Thought content normal.  Patient is very emotional today and cries intermittently.          Assessment & Plan:  Please see problem based charting for assessment and plan:

## 2017-04-01 NOTE — Addendum Note (Signed)
Addended by: Hulan Fray on: 04/01/2017 07:38 PM   Modules accepted: Orders

## 2017-04-06 NOTE — Addendum Note (Signed)
Addended by: Hulan Fray on: 04/06/2017 08:50 PM   Modules accepted: Orders

## 2017-05-05 ENCOUNTER — Telehealth: Payer: Self-pay | Admitting: *Deleted

## 2017-05-05 NOTE — Telephone Encounter (Signed)
Call to patient message was left to ask if patient has scheduled her Mammogram.  Patient has also been asked to get a Dexa Scan will speak with Dr. Dareen Piano to place order for.  Sharol Harness  05/05/2017 4:07 PM.

## 2017-05-06 ENCOUNTER — Other Ambulatory Visit: Payer: Self-pay | Admitting: Internal Medicine

## 2017-05-06 DIAGNOSIS — E2839 Other primary ovarian failure: Secondary | ICD-10-CM

## 2017-06-10 ENCOUNTER — Other Ambulatory Visit: Payer: Self-pay

## 2017-06-10 ENCOUNTER — Other Ambulatory Visit: Payer: Self-pay | Admitting: Pharmacy Technician

## 2017-06-10 DIAGNOSIS — F322 Major depressive disorder, single episode, severe without psychotic features: Secondary | ICD-10-CM

## 2017-06-10 MED ORDER — HYDROCHLOROTHIAZIDE 12.5 MG PO TABS: 12.5000 mg | ORAL_TABLET | Freq: Every day | ORAL | 0 refills | Status: DC

## 2017-06-10 MED ORDER — AMLODIPINE BESYLATE 2.5 MG PO TABS: 2.5000 mg | ORAL_TABLET | Freq: Every day | ORAL | 0 refills | Status: DC

## 2017-06-10 MED ORDER — VENLAFAXINE HCL ER 150 MG PO CP24: 300.0000 mg | ORAL_CAPSULE | Freq: Every day | ORAL | 0 refills | Status: DC

## 2017-06-10 MED ORDER — ATORVASTATIN CALCIUM 10 MG PO TABS: 10.0000 mg | ORAL_TABLET | Freq: Every day | ORAL | 0 refills | Status: DC

## 2017-06-10 NOTE — Patient Outreach (Signed)
[facility]) Care Management  06/10/2017  Sarah [name] 07/26/1945 [id number]  Incoming [employer] Emmi call in reference to medication adherence. HIPAA identifiers verified and verbal consent received. [name] gets her medication's through mail order and doesn't think she misses any doses of her medication. As I spoke with the patient further she stated she was out of her Amlodipine and was notified by Blase Mess that she was out of refills. She has been getting her medication's through the mail because she did not realize that she could get 3 month supplies through her local pharmacy. I explained to her that she could and her cost would be the same. At the patient's request I contacted the office of Dr. Dareen Piano and requested to have Amlodipine, Atorvastatin, Hydrochlorothiazide, and Venlafaxine XR sent in to [facility] [address].  Doreene Burke, [place] [phone]

## 2017-06-10 NOTE — Telephone Encounter (Signed)
Crystal with [facility] spoke with patient today and states  pt is out of amLODipine (NORVASC) 2.5 MG tablet,  atorvastatin (LIPITOR) 10 MG tablet,  hydrochlorothiazide (HYDRODIURIL) 12.5 MG tablet,  venlafaxine XR (EFFEXOR-XR) 150 MG 24 hr capsule.  Would like all meds to be filled @ [facility] [address] for 90 days supply.

## 2017-07-22 ENCOUNTER — Other Ambulatory Visit: Payer: Self-pay

## 2017-07-22 NOTE — Telephone Encounter (Signed)
Pt informed rx was sent to [facility] and has refill; and call the pharmacy. Stated she will.

## 2017-07-22 NOTE — Telephone Encounter (Signed)
venlafaxine XR (EFFEXOR-XR) 150 MG 24 hr capsule, refill request @ [facility]. Would like a call back.

## 2017-08-06 DIAGNOSIS — Z1231 Encounter for screening mammogram for malignant neoplasm of breast: Secondary | ICD-10-CM | POA: Diagnosis not present

## 2017-08-06 DIAGNOSIS — Z01419 Encounter for gynecological examination (general) (routine) without abnormal findings: Secondary | ICD-10-CM | POA: Diagnosis not present

## 2017-09-05 ENCOUNTER — Other Ambulatory Visit: Payer: Self-pay | Admitting: Internal Medicine

## 2017-09-05 DIAGNOSIS — F322 Major depressive disorder, single episode, severe without psychotic features: Secondary | ICD-10-CM

## 2017-09-15 ENCOUNTER — Encounter: Payer: Self-pay | Admitting: Internal Medicine

## 2017-09-15 ENCOUNTER — Encounter (INDEPENDENT_AMBULATORY_CARE_PROVIDER_SITE_OTHER): Payer: Self-pay

## 2017-09-15 ENCOUNTER — Ambulatory Visit (INDEPENDENT_AMBULATORY_CARE_PROVIDER_SITE_OTHER): Payer: PPO | Admitting: Internal Medicine

## 2017-09-15 VITALS — BP 127/61 | HR 103 | Temp 98.0°F | Ht 66.0 in | Wt 234.1 lb

## 2017-09-15 DIAGNOSIS — Z8719 Personal history of other diseases of the digestive system: Secondary | ICD-10-CM

## 2017-09-15 DIAGNOSIS — Z79899 Other long term (current) drug therapy: Secondary | ICD-10-CM

## 2017-09-15 DIAGNOSIS — R1319 Other dysphagia: Secondary | ICD-10-CM

## 2017-09-15 DIAGNOSIS — E559 Vitamin D deficiency, unspecified: Secondary | ICD-10-CM | POA: Diagnosis not present

## 2017-09-15 DIAGNOSIS — I10 Essential (primary) hypertension: Secondary | ICD-10-CM | POA: Diagnosis not present

## 2017-09-15 DIAGNOSIS — R269 Unspecified abnormalities of gait and mobility: Secondary | ICD-10-CM

## 2017-09-15 DIAGNOSIS — F322 Major depressive disorder, single episode, severe without psychotic features: Secondary | ICD-10-CM

## 2017-09-15 DIAGNOSIS — G8929 Other chronic pain: Secondary | ICD-10-CM | POA: Diagnosis not present

## 2017-09-15 DIAGNOSIS — M4807 Spinal stenosis, lumbosacral region: Secondary | ICD-10-CM | POA: Diagnosis not present

## 2017-09-15 DIAGNOSIS — R1310 Dysphagia, unspecified: Secondary | ICD-10-CM

## 2017-09-15 DIAGNOSIS — Z23 Encounter for immunization: Secondary | ICD-10-CM | POA: Diagnosis not present

## 2017-09-15 DIAGNOSIS — E785 Hyperlipidemia, unspecified: Secondary | ICD-10-CM

## 2017-09-15 DIAGNOSIS — Z0000 Encounter for general adult medical examination without abnormal findings: Secondary | ICD-10-CM

## 2017-09-15 DIAGNOSIS — M170 Bilateral primary osteoarthritis of knee: Secondary | ICD-10-CM

## 2017-09-15 MED ORDER — VENLAFAXINE HCL ER 150 MG PO CP24: 300.0000 mg | ORAL_CAPSULE | Freq: Every day | ORAL | 0 refills | Status: DC

## 2017-09-15 NOTE — Assessment & Plan Note (Signed)
-  Pneumonia vaccine (13 valent) given today -Patient is also scheduled for DEXA scan -She states that she got a mammogram done last year.  We will need to obtain records from this

## 2017-09-15 NOTE — Assessment & Plan Note (Addendum)
-  This problem is chronic and stable -Patient states that she is intermittently compliant with her vitamin D supplementation -We will check a repeat vitamin D level today  Addendum: -Repeat vitamin D level was 27 -Started patient on vitamin D 2000 units daily -Call patient discuss results with her and she is in agreement with starting vitamin D supplementation

## 2017-09-15 NOTE — Assessment & Plan Note (Addendum)
-  This problem is chronic and stable -Patient states she is compliant with atorvastatin 10 mg -We will obtain a lipid panel today  Addendum: -Lipid panel remains stable -We will continue with atorvastatin 10 mg daily

## 2017-09-15 NOTE — Assessment & Plan Note (Signed)
-  This problem is chronic and stable -Patient states that she has had no further issues with dysphagia -No further workup at this time -If the problem recurs we will refer the patient to GI

## 2017-09-15 NOTE — Assessment & Plan Note (Addendum)
BP Readings from Last 3 Encounters:  09/15/17 127/61  03/24/17 125/67  02/17/17 (!) 142/65    Lab Results  Component Value Date   NA 142 02/17/2017   K 4.4 02/17/2017   CREATININE 0.84 02/17/2017    Assessment: Blood pressure control:  Well-controlled Progress toward BP goal:   At goal Comments: Patient is compliant with amlodipine 2.5 mg and hydrochlorthiazide 12.5 mg daily  Plan: Medications:  continue current medications Educational resources provided:   Self management tools provided:   Other plans: We will check BMP today  Addendum: -BMP was within normal limits except for a mildly elevated sodium level -Discussed results with patient and encouraged her to increase oral hydration -Patient expressed understanding and is in agreement with plan -We will recheck BMP on follow-up with

## 2017-09-15 NOTE — Patient Instructions (Signed)
-  It was a pleasure seeing you today -Your blood pressure is well controlled.  Please continue current medications -Please follow-up with therapist for grief counseling -We will check some blood work today -Please call me with any questions

## 2017-09-15 NOTE — Assessment & Plan Note (Signed)
-  Patient still with severe depression secondary to the loss of her son and issues with her husband at home -She is doing a little bit better on the increased dose of the Effexor but would likely benefit from counseling -She states that she knows a grief counseling service and would like to go there -She was advised to contact the service and we will put a referral in if required

## 2017-09-15 NOTE — Assessment & Plan Note (Signed)
-  Patient states that her lower extremity pain is well controlled with Voltaren gel -Most likely etiology is osteoarthritis of her knees -Patient also complains of associated burning in her legs and was thought to have a mild neuropathic pain from lumbar spine disease (chronic narrowing of the L4-L5 and L5-S1 disc spaces).  She states these symptoms are well controlled with gabapentin -We will continue with Voltaren gel and gabapentin for now

## 2017-09-15 NOTE — Progress Notes (Signed)
   Subjective:    Patient ID: Sarah [name], female    DOB: Nov 18, 1945, 72 y.o.   MRN: [id number]  HPI  I have seen and examined this patient.  Patient is here for routine follow-up of her hypertension and depression.  Patient states that she is compliant with all her medications and has no new complaints at this time.  She does state that the increased dose of Effexor is helping a little bit but she still has issues with depression and is under a lot of stress since the death of her son.  She has no other complaints at this time.   Review of Systems  Constitutional: Negative.   HENT: Negative.   Respiratory: Negative.   Cardiovascular: Negative.   Gastrointestinal: Negative.   Musculoskeletal: Positive for gait problem.       Uses a walker or cane to get around  Psychiatric/Behavioral: Negative.        Objective:   Physical Exam  Constitutional: She is oriented to person, place, and time. She appears well-developed and well-nourished.  HENT:  Head: Normocephalic and atraumatic.  Mouth/Throat: No oropharyngeal exudate.  Neck: Neck supple.  Cardiovascular: Normal rate, regular rhythm and normal heart sounds.  Pulmonary/Chest: Effort normal and breath sounds normal. No respiratory distress. She has no wheezes.  Abdominal: Soft. Bowel sounds are normal. She exhibits no distension. There is no tenderness.  Musculoskeletal: Normal range of motion. She exhibits no edema.  Lymphadenopathy:    She has no cervical adenopathy.  Neurological: She is alert and oriented to person, place, and time.  Psychiatric: Her behavior is normal.  Patient does appear to have a normal mood today but does get emotional when talking about the passing of her son and the issues she is having with her husband          Assessment & Plan:  Please see problem based charting for assessment and plan:

## 2017-09-16 LAB — BMP8+ANION GAP
ANION GAP: 14 mmol/L (ref 10.0–18.0)
BUN / CREAT RATIO: 11 — AB (ref 12–28)
BUN: 9 mg/dL (ref 8–27)
CO2: 29 mmol/L (ref 20–29)
CREATININE: 0.82 mg/dL (ref 0.57–1.00)
Calcium: 9.4 mg/dL (ref 8.7–10.3)
Chloride: 104 mmol/L (ref 96–106)
GFR calc Af Amer: 84 mL/min/{1.73_m2} (ref >59)
GFR calc non Af Amer: 73 mL/min/{1.73_m2} (ref >59)
GLUCOSE: 93 mg/dL (ref 65–99)
POTASSIUM: 3.9 mmol/L (ref 3.5–5.2)
SODIUM: 147 mmol/L — AB (ref 134–144)

## 2017-09-16 LAB — LIPID PANEL
CHOLESTEROL TOTAL: 167 mg/dL (ref 100–199)
Chol/HDL Ratio: 3.3 ratio (ref 0.0–4.4)
HDL: 51 mg/dL (ref >39)
LDL Calculated: 93 mg/dL (ref 0–99)
TRIGLYCERIDES: 113 mg/dL (ref 0–149)
VLDL Cholesterol Cal: 23 mg/dL (ref 5–40)

## 2017-09-16 LAB — VITAMIN D 25 HYDROXY (VIT D DEFICIENCY, FRACTURES): Vit D, 25-Hydroxy: 27.1 ng/mL — ABNORMAL LOW (ref 30.0–100.0)

## 2017-09-16 MED ORDER — VITAMIN D 50 MCG (2000 UT) PO CAPS: 2000.0000 [IU] | ORAL_CAPSULE | Freq: Every day | ORAL | 3 refills | Status: DC

## 2017-09-16 NOTE — Addendum Note (Signed)
Addended by: Aldine Contes on: 09/16/2017 10:57 AM   Modules accepted: Orders

## 2017-09-29 ENCOUNTER — Ambulatory Visit
Admission: RE | Admit: 2017-09-29 | Discharge: 2017-09-29 | Disposition: A | Discharge status: Home / Self Care | Payer: PPO | Source: Ambulatory Visit | Attending: Internal Medicine | Admitting: Internal Medicine

## 2017-09-29 DIAGNOSIS — Z13820 Encounter for screening for osteoporosis: Secondary | ICD-10-CM | POA: Diagnosis not present

## 2017-09-29 DIAGNOSIS — Z780 Asymptomatic menopausal state: Secondary | ICD-10-CM | POA: Diagnosis not present

## 2017-09-29 DIAGNOSIS — E2839 Other primary ovarian failure: Secondary | ICD-10-CM

## 2017-11-18 ENCOUNTER — Other Ambulatory Visit: Payer: Self-pay | Admitting: Internal Medicine

## 2017-12-18 ENCOUNTER — Other Ambulatory Visit: Payer: Self-pay

## 2017-12-18 DIAGNOSIS — F322 Major depressive disorder, single episode, severe without psychotic features: Secondary | ICD-10-CM

## 2017-12-18 MED ORDER — VITAMIN D 50 MCG (2000 UT) PO CAPS: 2000.0000 [IU] | ORAL_CAPSULE | Freq: Every day | ORAL | 3 refills | Status: DC

## 2017-12-18 MED ORDER — AMLODIPINE BESYLATE 2.5 MG PO TABS: 2.5000 mg | ORAL_TABLET | Freq: Every day | ORAL | 3 refills | Status: DC

## 2017-12-18 MED ORDER — ATORVASTATIN CALCIUM 10 MG PO TABS: 10.0000 mg | ORAL_TABLET | Freq: Every day | ORAL | 3 refills | Status: DC

## 2017-12-18 MED ORDER — HYDROCHLOROTHIAZIDE 12.5 MG PO TABS: 12.5000 mg | ORAL_TABLET | Freq: Every day | ORAL | 3 refills | Status: DC

## 2017-12-18 MED ORDER — DICLOFENAC SODIUM 1 % TD GEL: 4.0000 g | Freq: Four times a day (QID) | TRANSDERMAL | 1 refills | Status: DC

## 2017-12-18 MED ORDER — GABAPENTIN 300 MG PO CAPS: ORAL_CAPSULE | ORAL | 3 refills | Status: DC

## 2017-12-18 MED ORDER — PSYLLIUM 28 % PO PACK
PACK | ORAL | 3 refills | Status: DC
Start: 2017-12-18 — End: 2020-04-07

## 2017-12-18 MED ORDER — VENLAFAXINE HCL ER 150 MG PO CP24: 300.0000 mg | ORAL_CAPSULE | Freq: Every day | ORAL | 3 refills | Status: DC

## 2017-12-18 NOTE — Telephone Encounter (Signed)
pls sch appt PCP

## 2017-12-18 NOTE — Telephone Encounter (Signed)
Requesting all meds to be filled @ [facility].

## 2017-12-21 ENCOUNTER — Telehealth: Payer: Self-pay | Admitting: *Deleted

## 2017-12-21 NOTE — Telephone Encounter (Signed)
Pharmacist at [facility] calls and ask for 90 days of voltaren gel, this would be 14 tubes for 90 days at prescribed dose, please send new script

## 2017-12-22 MED ORDER — DICLOFENAC SODIUM 1 % TD GEL: 4.0000 g | Freq: Four times a day (QID) | TRANSDERMAL | 14 refills | Status: DC

## 2018-01-04 ENCOUNTER — Telehealth: Payer: Self-pay | Admitting: Internal Medicine

## 2018-01-19 ENCOUNTER — Ambulatory Visit: Payer: Self-pay | Admitting: Internal Medicine

## 2018-04-06 ENCOUNTER — Ambulatory Visit: Payer: Self-pay | Admitting: Internal Medicine

## 2018-06-15 ENCOUNTER — Other Ambulatory Visit: Payer: Self-pay

## 2018-06-15 ENCOUNTER — Ambulatory Visit: Payer: PPO | Admitting: Licensed Clinical Social Worker

## 2018-06-15 ENCOUNTER — Ambulatory Visit (INDEPENDENT_AMBULATORY_CARE_PROVIDER_SITE_OTHER): Payer: PPO | Admitting: Internal Medicine

## 2018-06-15 ENCOUNTER — Encounter: Payer: Self-pay | Admitting: Internal Medicine

## 2018-06-15 ENCOUNTER — Encounter: Payer: Self-pay | Admitting: Licensed Clinical Social Worker

## 2018-06-15 VITALS — BP 134/59 | HR 90 | Temp 98.4°F | Ht 66.0 in | Wt 228.9 lb

## 2018-06-15 DIAGNOSIS — I10 Essential (primary) hypertension: Secondary | ICD-10-CM

## 2018-06-15 DIAGNOSIS — E559 Vitamin D deficiency, unspecified: Secondary | ICD-10-CM

## 2018-06-15 DIAGNOSIS — R400 Somnolence: Secondary | ICD-10-CM | POA: Diagnosis not present

## 2018-06-15 DIAGNOSIS — F339 Major depressive disorder, recurrent, unspecified: Secondary | ICD-10-CM | POA: Diagnosis not present

## 2018-06-15 DIAGNOSIS — F322 Major depressive disorder, single episode, severe without psychotic features: Secondary | ICD-10-CM

## 2018-06-15 DIAGNOSIS — R5382 Chronic fatigue, unspecified: Secondary | ICD-10-CM

## 2018-06-15 DIAGNOSIS — Z23 Encounter for immunization: Secondary | ICD-10-CM

## 2018-06-15 DIAGNOSIS — Z79899 Other long term (current) drug therapy: Secondary | ICD-10-CM | POA: Diagnosis not present

## 2018-06-15 DIAGNOSIS — G4733 Obstructive sleep apnea (adult) (pediatric): Secondary | ICD-10-CM | POA: Insufficient documentation

## 2018-06-15 DIAGNOSIS — F320 Major depressive disorder, single episode, mild: Secondary | ICD-10-CM

## 2018-06-15 DIAGNOSIS — Z0000 Encounter for general adult medical examination without abnormal findings: Secondary | ICD-10-CM

## 2018-06-15 NOTE — Progress Notes (Signed)
   Subjective:    Patient ID: Sarah [name], female    DOB: 1946-01-12, 72 y.o.   MRN: [id number]  HPI  I seen and examined this patient.  Patient is here for routine follow-up of her hypertension and depression.  Patient states that she continues to remain depressed and complains of worsening fatigue.  Patient also states that she is sleepy during the day and wakes up multiple times at night and is worried about obstructive sleep apnea.  She is compliant with all her medications and denies other complaints at this time.   Review of Systems  Constitutional: Negative.   HENT: Negative.   Respiratory: Negative.   Cardiovascular: Negative.   Gastrointestinal: Negative.   Musculoskeletal: Positive for arthralgias.  Neurological: Negative.   Psychiatric/Behavioral:       Appears depressed       Objective:   Physical Exam  Constitutional: She is oriented to person, place, and time. She appears well-developed and well-nourished.  HENT:  Head: Normocephalic and atraumatic.  Mouth/Throat: No oropharyngeal exudate.  Neck: Neck supple.  Cardiovascular: Normal rate and regular rhythm.  Pulmonary/Chest: Effort normal and breath sounds normal.  Abdominal: Soft. Bowel sounds are normal. She exhibits no distension. There is no tenderness.  Musculoskeletal: Normal range of motion. She exhibits no edema.  Lymphadenopathy:    She has no cervical adenopathy.  Neurological: She is alert and oriented to person, place, and time.  Skin: Skin is warm. No erythema.  Psychiatric: Her behavior is normal.  Depressed mood          Assessment & Plan:

## 2018-06-15 NOTE — Assessment & Plan Note (Signed)
-  Patient complains of excessive daytime somnolence as well as multiple episodes of waking up at night and feeling fatigued throughout the day -She is unsure if she snores or not -Patient scored 4 on stop bang questionnaire which puts her at high risk for OSA -We will get a sleep study

## 2018-06-15 NOTE — Assessment & Plan Note (Signed)
Flu shot given today

## 2018-06-15 NOTE — Patient Instructions (Signed)
-  It was a pleasure seeing you today -We will refer you to a therapist for depression -Continue with Effexor for now -We will check some blood work on you today  -I will refer you for a sleep study -Please call me if you have any questions

## 2018-06-15 NOTE — BH Specialist Note (Signed)
Integrated Behavioral Health Initial Visit  MRN: [id number] Name: Sarah [name]  Number of [place] Clinician visits:: 1/6 Session Start time: 10:51  Session End time: 10:31 Total time: 40 minutes  Type of Service: [place] Interpretor:No.     Warm Hand Off Completed. Yes, by Dr. Dareen Piano.       SUBJECTIVE: Sarah [name] is a 72 y.o. female  whom attended the session individually.  Patient was referred by Dr. Dareen Piano for adjustment to life circumstances. Patient reports the following symptoms/concerns: periods of sadness and hopelessness.  Duration of problem: increased over the past several years; Severity of problem: mild  OBJECTIVE: Mood: Depressed and Hopeless and Affect: Depressed and Tearful Risk of harm to self or others: No plan to harm self or others  LIFE CONTEXT: Family and Social: Patient lives with her husband of 44 years. Patient processed concerns related to her marriage and her husband's health. Patient is her husband's primary care giver, and spends most of her time meeting his needs. Patient's only son passed away in Oct 25, 2013, and patient is still grieving the loss of her son. Patient attends church.  Self-Care: Needs improvement. Patient reported she is fatigue and lacks energy.  Life Changes: Patient's husband had a stroke, and requires daily care from the patient.   GOALS ADDRESSED: Patient will: 1. Reduce symptoms of: depression 2. Increase knowledge and/or ability of: coping skills and stress reduction  3. Demonstrate ability to: Increase healthy adjustment to current life circumstances, Increase adequate support systems for patient/family and Begin healthy grieving over loss  INTERVENTIONS: Interventions utilized: Motivational Interviewing, Brief CBT and Supportive Counseling  Standardized Assessments completed: assessed for SI,  HI, and self-harm.   ASSESSMENT: Patient currently  experiencing periods of sadness and hopelessness related to her life circumstances. Patient is continuing to grieve the loss of her only son. Patient reports feeling tired daily. Patient has no thoughts of SI, HI, or self-harm.    Patient may benefit from weekly to bi-weekly outpatient therapy.  PLAN: 1. Follow up with behavioral health clinician on : one to two weeks.  2. Referral(s): [place] (In Clinic)   [place], [place], [place]

## 2018-06-15 NOTE — Assessment & Plan Note (Signed)
-  This problem is chronic but currently worsening -Patient states that she has no energy to do any work and does not leave the house as much as she wants to -We will check TSH, vitamin B12, CBC to rule out other possible causes of fatigue but I suspect this may be secondary to underlying depression -No further work-up at this time

## 2018-06-15 NOTE — Assessment & Plan Note (Signed)
-  This problem is chronic and stable -Patient states that she is compliant with her vitamin D supplementation -We will recheck a vitamin D level today -No further work-up at this time

## 2018-06-15 NOTE — Assessment & Plan Note (Signed)
-  This problem is chronic -Patient appears to have worsening depression and scored very high on her PHQ 9 (21 today) -She is on Effexor 300 mg daily with minimal effect -I discussed the case with our counselor Dessie Coma who will evaluate her -I suspect the patient may eventually need referral to psychiatry as well for her depression -We will continue to monitor closely

## 2018-06-15 NOTE — Assessment & Plan Note (Signed)
BP Readings from Last 3 Encounters:  06/15/18 (!) 134/59  09/15/17 127/61  03/24/17 125/67    Lab Results  Component Value Date   NA 147 (H) 09/15/2017   K 3.9 09/15/2017   CREATININE 0.82 09/15/2017    Assessment: Blood pressure control:  Well-controlled Progress toward BP goal:   At goal Comments: Patient is compliant with HCTZ 12.5 mg and amlodipine 2.5 mg  Plan: Medications:  continue current medications Educational resources provided:   Self management tools provided:   Other plans: We will check BMP today

## 2018-06-16 ENCOUNTER — Encounter: Payer: Self-pay | Admitting: Internal Medicine

## 2018-06-16 LAB — BMP8+ANION GAP
Anion Gap: 20 mmol/L — ABNORMAL HIGH (ref 10.0–18.0)
BUN/Creatinine Ratio: 12 (ref 12–28)
BUN: 12 mg/dL (ref 8–27)
CALCIUM: 9.5 mg/dL (ref 8.7–10.3)
CO2: 28 mmol/L (ref 20–29)
CREATININE: 0.98 mg/dL (ref 0.57–1.00)
Chloride: 96 mmol/L (ref 96–106)
GFR calc Af Amer: 67 mL/min/{1.73_m2} (ref >59)
GFR, EST NON AFRICAN AMERICAN: 58 mL/min/{1.73_m2} — AB (ref >59)
Glucose: 112 mg/dL — ABNORMAL HIGH (ref 65–99)
Potassium: 3.1 mmol/L — ABNORMAL LOW (ref 3.5–5.2)
Sodium: 144 mmol/L (ref 134–144)

## 2018-06-16 LAB — CBC WITH DIFFERENTIAL/PLATELET
BASOS ABS: 0 10*3/uL (ref 0.0–0.2)
Basos: 0 %
EOS (ABSOLUTE): 0 10*3/uL (ref 0.0–0.4)
EOS: 0 %
HEMOGLOBIN: 12.2 g/dL (ref 11.1–15.9)
Hematocrit: 35.6 % (ref 34.0–46.6)
IMMATURE GRANULOCYTES: 0 %
Immature Grans (Abs): 0 10*3/uL (ref 0.0–0.1)
Lymphocytes Absolute: 1.7 10*3/uL (ref 0.7–3.1)
Lymphs: 29 %
MCH: 29.7 pg (ref 26.6–33.0)
MCHC: 34.3 g/dL (ref 31.5–35.7)
MCV: 87 fL (ref 79–97)
MONOCYTES: 12 %
MONOS ABS: 0.7 10*3/uL (ref 0.1–0.9)
NEUTROS PCT: 59 %
Neutrophils Absolute: 3.5 10*3/uL (ref 1.4–7.0)
Platelets: 299 10*3/uL (ref 150–450)
RBC: 4.11 x10E6/uL (ref 3.77–5.28)
RDW: 13.1 % (ref 12.3–15.4)
WBC: 5.9 10*3/uL (ref 3.4–10.8)

## 2018-06-16 LAB — VITAMIN B12: VITAMIN B 12: 268 pg/mL (ref 232–1245)

## 2018-06-16 LAB — VITAMIN D 25 HYDROXY (VIT D DEFICIENCY, FRACTURES): Vit D, 25-Hydroxy: 46.9 ng/mL (ref 30.0–100.0)

## 2018-06-16 LAB — TSH: TSH: 2.83 u[IU]/mL (ref 0.450–4.500)

## 2018-07-05 ENCOUNTER — Telehealth: Payer: Self-pay | Admitting: Licensed Clinical Social Worker

## 2018-07-05 NOTE — Telephone Encounter (Signed)
Patient was contacted to offer a future appointment. Patient was scheduled for 07/21/18 at 9:00.

## 2018-07-20 ENCOUNTER — Other Ambulatory Visit: Payer: Self-pay

## 2018-07-20 DIAGNOSIS — G8929 Other chronic pain: Secondary | ICD-10-CM

## 2018-07-20 MED ORDER — GABAPENTIN 300 MG PO CAPS: ORAL_CAPSULE | ORAL | 3 refills | Status: DC

## 2018-07-20 NOTE — Telephone Encounter (Signed)
gabapentin (NEURONTIN) 300 MG capsule, REFILL REQUEST @  [facility] - [place], [place] [phone] (Phone) [phone] (Fax)

## 2018-07-21 ENCOUNTER — Encounter: Payer: Self-pay | Admitting: Licensed Clinical Social Worker

## 2018-07-21 ENCOUNTER — Ambulatory Visit: Payer: PPO | Admitting: Licensed Clinical Social Worker

## 2018-07-21 DIAGNOSIS — F320 Major depressive disorder, single episode, mild: Secondary | ICD-10-CM

## 2018-07-21 NOTE — BH Specialist Note (Signed)
Integrated Behavioral Health Follow Up Visit  MRN: [id number] Name: Sarah [name]  Number of [place] Clinician visits: 2/6 Session Start time: 9:45  Session End time: 10:15 Total time: 30 minutes  Type of Service: Integrated Behavioral Health- Individual/Family Interpretor:No.    SUBJECTIVE: FELISIA BALCOM is a 73 y.o. female accompanied by whom attended the session individually.  Patient was referred by Dr. Dareen Piano for adjustment to life circumstances. Patient reports the following symptoms/concerns: continued periods of sadness, hopelessness, lack of natural supports, and frustration towards her spouse.  Duration of problem: increased over the past several years; Severity of problem: mild  OBJECTIVE: Mood: Depressed and Hopeless and Affect: Tearful Risk of harm to self or others: No plan to harm self or others  LIFE CONTEXT: Family and Social: Patient continues to live with her husband, and feels that she has to remain in the marriage due to her religious beliefs. Patient is angry that she has to be her husband's only caretaker, and has no assistance from other family members. Patient reported she attends church, but does not share her family struggles with anyone.  Self-Care: Patient reported feeling fatigue all of the time. Patient takes very little time for herself. Patient reported she wanted to go on a cruise, but is not even able to go due to having no one to look after her husband.  Life Changes: No changes since the last session.   GOALS ADDRESSED: Patient will: 1.  Reduce symptoms of: agitation and depression  2.  Increase knowledge and/or ability of: coping skills and stress reduction  3.  Demonstrate ability to: Increase healthy adjustment to current life circumstances and Increase adequate support systems for patient/family  INTERVENTIONS: Interventions utilized:  Brief CBT and Supportive Counseling. CBT was used to identify how her negative  thoughts can create negative feelings and behaviors. MI was used to explore healthy coping skills the patient can utilize when feeling angry or "stuck". MI was used to explore the patient's options, and the therapist empowered the patient to identify the control she does have over her life.  Standardized Assessments completed: Not Needed  ASSESSMENT: Patient currently experiencing frustration with her marriage and lack of support she receives from her spouse. Patient is tired daily, and wants to be given B-12 in order to increase her energy levels. Patient continues to use her faith as her main coping skill to accept her life circumstances. Patient reports feeling down or hopeless most days regarding her husband and lack of support. Patient denies having any thoughts of harming herself.   Patient may benefit from weekly to bi-weekly outpatient therapy.  PLAN: 1. Follow up with behavioral health clinician on : one to two weeks.   Dessie Coma, LPC, LCAS

## 2018-09-07 ENCOUNTER — Encounter (HOSPITAL_BASED_OUTPATIENT_CLINIC_OR_DEPARTMENT_OTHER): Payer: Self-pay

## 2018-10-27 ENCOUNTER — Telehealth: Payer: Self-pay | Admitting: Licensed Clinical Social Worker

## 2018-10-27 NOTE — Telephone Encounter (Signed)
Patient was contacted, and a voicemail was left to inform her of my services currently being offered via telephone. The office number was left for the patient in case she wanted to schedule a future appointment.

## 2019-02-22 ENCOUNTER — Other Ambulatory Visit: Payer: Self-pay | Admitting: Internal Medicine

## 2019-02-22 DIAGNOSIS — F322 Major depressive disorder, single episode, severe without psychotic features: Secondary | ICD-10-CM

## 2019-02-22 MED ORDER — HYDROCHLOROTHIAZIDE 12.5 MG PO TABS: 12.5000 mg | ORAL_TABLET | Freq: Every day | ORAL | 0 refills | Status: DC

## 2019-02-22 MED ORDER — VITAMIN D 50 MCG (2000 UT) PO CAPS: 2000.0000 [IU] | ORAL_CAPSULE | Freq: Every day | ORAL | 0 refills | Status: DC

## 2019-02-22 MED ORDER — VENLAFAXINE HCL ER 150 MG PO CP24: 300.0000 mg | ORAL_CAPSULE | Freq: Every day | ORAL | 0 refills | Status: DC

## 2019-02-22 MED ORDER — ATORVASTATIN CALCIUM 10 MG PO TABS: 10.0000 mg | ORAL_TABLET | Freq: Every day | ORAL | 0 refills | Status: DC

## 2019-02-22 NOTE — Progress Notes (Signed)
Patient called requesting medication refills. She had called clinic earlier today for same but it appears clinic staff was not able to reach her. She has amlodipine and gabapentin at home, but ran out of HCTZ, effexor, atorvastatin, and vitamin D 1 week ago. She has an appt in clinic on 8/18 for refills and is requesting prescription for a few tablets that will last her until the appt. Send prescriptions for HCTZ 12.5 mg QD, effexor 300 mg QD, Vit D 2000 U QD, and atorva 10 mg QD to last until 8/18 to [facility] on [address].   Welford Roche, MD  Internal Medicine PGY-3  P [phone]

## 2019-02-22 NOTE — Telephone Encounter (Signed)
Called pt about her refill requests - no answer; left message to call the office.

## 2019-02-22 NOTE — Telephone Encounter (Signed)
Needs refills on all medicine [facility] ([place], [place]  ;pt contact [phone]   Pt is completely out of all medicine, pt is wanting the physician to call her a prescription for 15 days at [place], [place]

## 2019-03-01 ENCOUNTER — Other Ambulatory Visit: Payer: Self-pay

## 2019-03-01 ENCOUNTER — Ambulatory Visit (INDEPENDENT_AMBULATORY_CARE_PROVIDER_SITE_OTHER): Payer: PPO | Admitting: Internal Medicine

## 2019-03-01 VITALS — BP 140/72 | HR 80 | Temp 98.7°F | Wt 234.6 lb

## 2019-03-01 DIAGNOSIS — E785 Hyperlipidemia, unspecified: Secondary | ICD-10-CM | POA: Diagnosis not present

## 2019-03-01 DIAGNOSIS — E559 Vitamin D deficiency, unspecified: Secondary | ICD-10-CM | POA: Diagnosis not present

## 2019-03-01 DIAGNOSIS — I10 Essential (primary) hypertension: Secondary | ICD-10-CM | POA: Diagnosis not present

## 2019-03-01 DIAGNOSIS — Z0000 Encounter for general adult medical examination without abnormal findings: Secondary | ICD-10-CM

## 2019-03-01 DIAGNOSIS — M25561 Pain in right knee: Secondary | ICD-10-CM | POA: Diagnosis not present

## 2019-03-01 DIAGNOSIS — G8929 Other chronic pain: Secondary | ICD-10-CM

## 2019-03-01 DIAGNOSIS — M25562 Pain in left knee: Secondary | ICD-10-CM

## 2019-03-01 DIAGNOSIS — G4733 Obstructive sleep apnea (adult) (pediatric): Secondary | ICD-10-CM

## 2019-03-01 DIAGNOSIS — Z79899 Other long term (current) drug therapy: Secondary | ICD-10-CM

## 2019-03-01 DIAGNOSIS — F339 Major depressive disorder, recurrent, unspecified: Secondary | ICD-10-CM

## 2019-03-01 DIAGNOSIS — F322 Major depressive disorder, single episode, severe without psychotic features: Secondary | ICD-10-CM

## 2019-03-01 MED ORDER — AMLODIPINE BESYLATE 2.5 MG PO TABS: 2.5000 mg | ORAL_TABLET | Freq: Every day | ORAL | 3 refills | Status: DC

## 2019-03-01 MED ORDER — VITAMIN D 50 MCG (2000 UT) PO CAPS: 2000.0000 [IU] | ORAL_CAPSULE | Freq: Every day | ORAL | 0 refills | Status: DC

## 2019-03-01 MED ORDER — VENLAFAXINE HCL ER 150 MG PO CP24: 300.0000 mg | ORAL_CAPSULE | Freq: Every day | ORAL | 1 refills | Status: DC

## 2019-03-01 MED ORDER — ATORVASTATIN CALCIUM 10 MG PO TABS: 10.0000 mg | ORAL_TABLET | Freq: Every day | ORAL | 1 refills | Status: DC

## 2019-03-01 MED ORDER — DICLOFENAC SODIUM 1 % TD GEL: 4.0000 g | Freq: Four times a day (QID) | TRANSDERMAL | 3 refills | Status: DC

## 2019-03-01 MED ORDER — HYDROCHLOROTHIAZIDE 12.5 MG PO TABS: 12.5000 mg | ORAL_TABLET | Freq: Every day | ORAL | 1 refills | Status: DC

## 2019-03-01 NOTE — Assessment & Plan Note (Signed)
-  Patient states that she has been having difficulty sleeping secondary to her depressed mood and also complains of increased fatigue during the day -We will put in for home sleep study on her last visit patient states that she does not want a home sleep study and she wants it done in the sleep center -I have put in referral today to [facility] long sleep center for split-night study -We will follow-up results -I suspect the patient will likely need a CPAP at night

## 2019-03-01 NOTE — Assessment & Plan Note (Signed)
-  Patient complains of pain in her knees bilaterally on ambulation -I suspect this is secondary to osteoarthritis of her knees -Patient states that the pain happens only occasionally -She states that she has run out of her Voltaren gel but is unsure of the efficacy of this medication -I have refilled the Voltaren gel for her and advised her to use it when she has pain -If it is not working we will address this at her follow-up visit -Would also consider imaging of her knees if pain persists

## 2019-03-01 NOTE — Assessment & Plan Note (Signed)
-  Patient states that her depressive symptoms are worsening -Patient's PHQ 9 score remains elevated at 13 today -We will continue with Effexor 300 mg daily for now -I have referred her back to our behavioral health clinician Dessie Coma who will evaluate her -I suspect the patient will also need referral to psychiatry given that she is on max dose of Effexor and still has significant depression -A lot of the patient's issues stem from her relationship with her husband and she is trying to work through these issues but they appear to be causing her a lot of stress -We will continue to monitor closely

## 2019-03-01 NOTE — Assessment & Plan Note (Signed)
-  Patient is due for flu shot -However, flu shot is not available currently in [facility] -Patient was advised to get this at her local pharmacy and to call us when she gets this so we can note this in our records

## 2019-03-01 NOTE — Progress Notes (Signed)
   Subjective:    Patient ID: Sarah [name], female    DOB: Oct 21, 1945, 73 y.o.   MRN: [id number]  HPI  I have seen and examined this patient.  Patient is here for follow-up of her depression and hypertension.  Patient states that she feels like her depression is worsening and that since the start of the pandemic she is also not been able to get out of the house and get space for herself.  She is also not been able to get follow-up with our behavioral health clinician and states that her previous visits with her were helping.  Patient also complains of decreased sleep but states that her appetite is at baseline.  Patient is compliant with her medications and denies any other complaints at this time.  Review of Systems  Constitutional: Positive for fatigue. Negative for appetite change and unexpected weight change.  HENT: Negative.   Respiratory: Negative.   Cardiovascular: Negative.   Gastrointestinal: Negative.   Musculoskeletal: Positive for arthralgias.  Neurological: Negative.   Psychiatric/Behavioral:       Depressed mood       Objective:   Physical Exam Constitutional:      Appearance: Normal appearance.  HENT:     Head: Normocephalic and atraumatic.  Neck:     Musculoskeletal: Neck supple.  Cardiovascular:     Rate and Rhythm: Normal rate and regular rhythm.     Heart sounds: Normal heart sounds.  Pulmonary:     Effort: Pulmonary effort is normal.     Breath sounds: Normal breath sounds. No wheezing or rales.  Abdominal:     General: Bowel sounds are normal. There is no distension.     Palpations: Abdomen is soft.     Tenderness: There is no abdominal tenderness.  Musculoskeletal:        General: No swelling or tenderness.  Lymphadenopathy:     Cervical: No cervical adenopathy.  Neurological:     General: No focal deficit present.     Mental Status: She is alert and oriented to person, place, and time.  Psychiatric:        Behavior: Behavior normal.   Comments: Depressed mood           Assessment & Plan:  Please see problem based charting for assessment and plan:

## 2019-03-01 NOTE — Assessment & Plan Note (Signed)
BP Readings from Last 3 Encounters:  03/01/19 140/72  06/15/18 (!) 134/59  09/15/17 127/61    Lab Results  Component Value Date   NA 144 06/15/2018   K 3.1 (L) 06/15/2018   CREATININE 0.98 06/15/2018    Assessment: Blood pressure control:  Well controlled Progress toward BP goal:   At goal Comments: Patient is compliant with HCTZ 12.5 mg daily as well as amlodipine 2.5 mg daily  Plan: Medications:  continue current medications Educational resources provided:   Self management tools provided:   Other plans: We will check BMP today

## 2019-03-01 NOTE — Patient Instructions (Signed)
-  It was a pleasure seeing you today -I will put you in for referral to [facility] (behavioral health clinician).  I will also ask for Ashton's help in referring you to psychiatry -I have refilled all your medications -We will check some blood work on you today -Your blood pressure was elevated today.  Please continue to take your medications and if it remains elevated on your next visit we will consider increasing your medications -Please call if you have any questions or concerns or difficulty obtaining her medications

## 2019-03-01 NOTE — Assessment & Plan Note (Signed)
-  This problem is chronic and stable -We will check a vitamin D level today -Continue vitamin D supplementation for now -No further work-up at this time

## 2019-03-01 NOTE — Assessment & Plan Note (Signed)
-  This problem is chronic and stable -We will check lipid panel today -I have refilled the patient's atorvastatin -No further work-up at this time

## 2019-03-02 ENCOUNTER — Telehealth: Payer: Self-pay | Admitting: Internal Medicine

## 2019-03-02 LAB — BMP8+ANION GAP
Anion Gap: 16 mmol/L (ref 10.0–18.0)
BUN/Creatinine Ratio: 8 — ABNORMAL LOW (ref 12–28)
BUN: 8 mg/dL (ref 8–27)
CO2: 26 mmol/L (ref 20–29)
Calcium: 9.4 mg/dL (ref 8.7–10.3)
Chloride: 98 mmol/L (ref 96–106)
Creatinine, Ser: 1.03 mg/dL — ABNORMAL HIGH (ref 0.57–1.00)
GFR calc Af Amer: 63 mL/min/{1.73_m2} (ref >59)
GFR calc non Af Amer: 54 mL/min/{1.73_m2} — ABNORMAL LOW (ref >59)
Glucose: 94 mg/dL (ref 65–99)
Potassium: 4 mmol/L (ref 3.5–5.2)
Sodium: 140 mmol/L (ref 134–144)

## 2019-03-02 LAB — LIPID PANEL
Chol/HDL Ratio: 3.4 ratio (ref 0.0–4.4)
Cholesterol, Total: 169 mg/dL (ref 100–199)
HDL: 50 mg/dL (ref >39)
LDL Calculated: 103 mg/dL — ABNORMAL HIGH (ref 0–99)
Triglycerides: 78 mg/dL (ref 0–149)
VLDL Cholesterol Cal: 16 mg/dL (ref 5–40)

## 2019-03-02 LAB — VITAMIN D 25 HYDROXY (VIT D DEFICIENCY, FRACTURES): Vit D, 25-Hydroxy: 43.5 ng/mL (ref 30.0–100.0)

## 2019-03-02 NOTE — Telephone Encounter (Signed)
I called the patient to discuss the results of her blood work with her.  Patient was noted to have mildly elevated creatinine of 1.03 which is close to her baseline of 0.9.  We will monitor this closely and recheck this at her follow-up visit but I suspect that it is possibly secondary to mild dehydration. Her vitamin D level was within normal limits.  Her lipid panel was also noted to be similar to her prior cholesterol panel.  No further work-up at this time.  We will continue to monitor her closely.  Patient expresses understanding and is in agreement with plan.

## 2019-03-22 ENCOUNTER — Telehealth: Payer: Self-pay | Admitting: Licensed Clinical Social Worker

## 2019-03-22 NOTE — Telephone Encounter (Signed)
Patient was contacted due to a referral from her doctor. Patient agreed to services, and will be added to my schedule for 9/10 @ 10:30.

## 2019-03-24 ENCOUNTER — Ambulatory Visit: Payer: PPO | Admitting: Licensed Clinical Social Worker

## 2019-03-24 ENCOUNTER — Ambulatory Visit (INDEPENDENT_AMBULATORY_CARE_PROVIDER_SITE_OTHER): Payer: PPO | Admitting: Licensed Clinical Social Worker

## 2019-03-24 ENCOUNTER — Encounter: Payer: Self-pay | Admitting: Licensed Clinical Social Worker

## 2019-03-24 ENCOUNTER — Other Ambulatory Visit: Payer: Self-pay

## 2019-03-24 DIAGNOSIS — F320 Major depressive disorder, single episode, mild: Secondary | ICD-10-CM

## 2019-03-24 NOTE — BH Specialist Note (Signed)
Integrated Behavioral Health Follow Up Visit  MRN: [id number] Name: Sarah [name]  Number of [place] Clinician visits: 3/6 Session Start time: 10:41  Session End time: 11:07 Total time: 26 minutes  Type of Service: Integrated Behavioral Health- Individual Interpretor:No.  SUBJECTIVE: Sarah [name] is a 73 y.o. female  whom attended the session indivdiually.  Patient was referred by Dr. Dareen Piano for depression. Patient reports the following symptoms/concerns: depression, anger, and family dynamic issues. Duration of problem: increased over the past several years; Severity of problem: moderate  OBJECTIVE: Mood: Depressed and Hopeless and Affect: Depressed Risk of harm to self or others: No plan to harm self or others  LIFE CONTEXT: Family and Social: Patient continues to live with her husband, and reported she has limited socialization with others.   GOALS ADDRESSED: Patient will: 1.  Reduce symptoms of: anxiety, depression, stress and anger.  2.  Increase knowledge and/or ability of: coping skills and stress reduction  3.  Demonstrate ability to: Increase healthy adjustment to current life circumstances and Increase adequate support systems for patient/family  INTERVENTIONS: Interventions utilized:  Motivational Interviewing, Behavioral Activation, Brief CBT and Supportive Counseling Standardized Assessments completed: assessed for SI, HI, and self-harm.  ASSESSMENT: Patient currently experiencing ongoing depression and anger. Patient reported that she has no joy in her life, and does not feel like she is living. Patient is denying any thoughts of hurting herself or others, but reported she is not living the life she wants to. Patient continues to carry anger around due to having to care for her spouse that has not been emotionally available to her for the majority of their marriage. Patient stays in the marriage because of her religion. Patient reported over  the past three months her husband has started following her around, and giving her very little individual time. Patient would like to address her anger towards her life circumstances in order to increase her happiness.    Patient may benefit from counseling.  PLAN: 1. Follow up with behavioral health clinician on : three weeks.   Dessie Coma, [facility], [place]

## 2019-04-14 ENCOUNTER — Ambulatory Visit: Payer: PPO | Admitting: Licensed Clinical Social Worker

## 2019-05-12 ENCOUNTER — Other Ambulatory Visit: Payer: Self-pay

## 2019-05-12 ENCOUNTER — Ambulatory Visit (INDEPENDENT_AMBULATORY_CARE_PROVIDER_SITE_OTHER): Payer: PPO | Admitting: Licensed Clinical Social Worker

## 2019-05-12 DIAGNOSIS — F320 Major depressive disorder, single episode, mild: Secondary | ICD-10-CM

## 2019-05-17 ENCOUNTER — Encounter: Payer: Self-pay | Admitting: Licensed Clinical Social Worker

## 2019-05-17 NOTE — BH Specialist Note (Signed)
Integrated Behavioral Health Follow Up Visit  MRN: [id number] Name: Sarah [name]  Number of [place] Clinician visits: 4/6 Session Start time: 10:40  Session End time: 11:30 Total time: 50  minutes  Type of Service: Integrated Behavioral Health- Individual Interpretor:No.   SUBJECTIVE: Sarah [name] is a 73 y.o. female  whom attended the session individually. Patient was referred by Dr. Dareen Piano for depression. Patient reports the following symptoms/concerns: depression, anger, and family dynamic issues.  Duration of problem: increased over the past several years; Severity of problem: moderate  OBJECTIVE: Mood: Depressed and Hopeless and Affect: Depressed and Tearful Risk of harm to self or others: No plan to harm self or others  LIFE CONTEXT: Family and Social: Patient is continues to report stressors in the home with control and communication.  Self-Care: Needs improvement.   GOALS ADDRESSED: Patient will: 1.  Reduce symptoms of: anxiety, depression and stress  2.  Increase knowledge and/or ability of: coping skills, healthy habits and stress reduction  3.  Demonstrate ability to: Increase healthy adjustment to current life circumstances and Increase adequate support systems for patient/family  INTERVENTIONS: Interventions utilized:  Solution-Focused Strategies, Behavioral Activation, Brief CBT and Supportive Counseling Standardized Assessments completed: Not Needed  ASSESSMENT: Patient currently experiencing moderate levels of anxiety and depression. Patient continues to share her situation that is a trigger for stress and anxiety at each session. Patient identified her triggers for anxiety, but has not made long-term changes to reduce her anxiety. Patient reported she feels controlled by her spouse, and lacks privacy and freedom. Patient struggled to identify alternate solutions to increasing autonomy. Patient's religion is helping to give her strength  through this challenging time in her life. Patient identified her church family as a support system to her. Patient can benefit from allowing extra time for herself to engage in activities that are healthy for her.  Patient continues to carry anger and ressentiment towards her spouse for past behaviors.   Patient may benefit from counseling.  PLAN: 1. Follow up with behavioral health clinician on : two weeks.   Dessie Coma, [facility], [place]

## 2019-06-02 ENCOUNTER — Ambulatory Visit: Payer: PPO | Admitting: Licensed Clinical Social Worker

## 2019-06-22 ENCOUNTER — Other Ambulatory Visit: Payer: Self-pay

## 2019-06-22 ENCOUNTER — Encounter: Payer: Self-pay | Admitting: Licensed Clinical Social Worker

## 2019-06-22 ENCOUNTER — Ambulatory Visit (INDEPENDENT_AMBULATORY_CARE_PROVIDER_SITE_OTHER): Payer: PPO | Admitting: Licensed Clinical Social Worker

## 2019-06-22 DIAGNOSIS — F321 Major depressive disorder, single episode, moderate: Secondary | ICD-10-CM

## 2019-06-22 NOTE — BH Specialist Note (Signed)
Integrated Behavioral Health Follow Up Visit  MRN: [id number] Name: Sarah [name]  Session Start time: 1:10  Session End time: 1:50 Total time: 40  minutes  Type of Service: Integrated Behavioral Health- Individual Interpretor:No.   SUBJECTIVE: Sarah [name] is a 73 y.o. female whom attended the session individually.  Patient was referred by Dr.Narendra for depression. Patient reports the following symptoms/concerns: depression, agitation, and family dynamic issues. 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  School/Work: Retired.  Self-Care: Needs improvement. Very little time to devote to her own needs.    GOALS ADDRESSED: Patient will: 1.  Reduce symptoms of: agitation, depression and stress  2.  Increase knowledge and/or ability of: coping skills, healthy habits and stress reduction  3.  Demonstrate ability to: Increase healthy adjustment to current life circumstances and Increase adequate support systems for patient/family  INTERVENTIONS: Interventions utilized:  Motivational Interviewing, Brief CBT and Supportive Counseling Standardized Assessments completed: assessed for SI, HI, and self-harm.  ASSESSMENT: Patient currently experiencing moderate levels of depression. Patient reported she has transitioned beyond the feelings of "stress", and feels depression is a more appropriate word to describe her current state. Patient is continuing to cry daily and full unfulfilled with her life. Patient processed triggers for her depression. Patient has very little support with her husband from others, and is continuing to have to carry the entire responsibility of his health needs.    Patient may benefit from  counseling.  PLAN: 1. Follow up with behavioral health clinician on : three weeks.  Dessie Coma, [facility], [place]

## 2019-07-13 ENCOUNTER — Ambulatory Visit: Payer: PPO | Admitting: Licensed Clinical Social Worker

## 2019-07-19 ENCOUNTER — Ambulatory Visit: Payer: PPO | Admitting: Licensed Clinical Social Worker

## 2019-07-27 ENCOUNTER — Other Ambulatory Visit: Payer: Self-pay

## 2019-07-27 ENCOUNTER — Encounter: Payer: Self-pay | Admitting: Licensed Clinical Social Worker

## 2019-07-27 ENCOUNTER — Ambulatory Visit (INDEPENDENT_AMBULATORY_CARE_PROVIDER_SITE_OTHER): Payer: PPO | Admitting: Licensed Clinical Social Worker

## 2019-07-27 DIAGNOSIS — F321 Major depressive disorder, single episode, moderate: Secondary | ICD-10-CM

## 2019-07-27 NOTE — BH Specialist Note (Signed)
Integrated Behavioral Health Follow Up Visit  MRN: [id number] Name: Sarah [name]  Session Start time: 9:20  Session End time: 10:00 Total time: 40  minutes  Type of Service: Integrated Behavioral Health- Individual Interpretor:No.   SUBJECTIVE: Sarah [name] is a 74 y.o. femalewhom attended the session individually. Patient was referred by Dr. Dareen Piano for depression. Patient reports the following symptoms/concerns: depression, agitation, and family dynamic issues. Duration of problem: increased over the past several years; Severity of problem: moderate  OBJECTIVE: Mood: Depressed and Affect: Depressed Risk of harm to self or others: No plan to harm self or others  LIFE CONTEXT: Family and Social: Patient is continuing to be the sole care taker for her spouse. Patient has limited supports in regards to assisting her with her spouse and his needs.  Self-Care: Limited time available to devote to her own personal goals/needs.   GOALS ADDRESSED: Patient will: 1.  Reduce symptoms of: agitation, depression and stress  2.  Increase knowledge and/or ability of: coping skills, healthy habits and stress reduction  3.  Demonstrate ability to: Increase healthy adjustment to current life circumstances and Increase adequate support systems for patient/family  INTERVENTIONS: Interventions utilized:  Motivational Interviewing, Brief CBT and Supportive Counseling Standardized Assessments completed: assessed for SI, HI, and self-harm.  ASSESSMENT: Patient currently experiencing ongoing depression and frustration towards her family situation. Patient expressed resentment towards her spouse, and what role she is currently filling. Patient would like to increase her level of happiness, and engage in activities that bring her fulfillment. Patient has not made changes in her behaviors to incorporate this goal at this time. Patient reported she has very little time to devote to herself and needs due to  taking care of her spouse.    Patient may benefit from counseling.  PLAN: 1. Follow up with behavioral health clinician on : three weeks.   Dessie Coma, [facility], [place]

## 2019-08-15 ENCOUNTER — Encounter: Payer: Self-pay | Admitting: *Deleted

## 2019-08-15 NOTE — Progress Notes (Unsigned)

## 2019-08-16 NOTE — Progress Notes (Unsigned)
Things That May Be Affecting Your Health:  Alcohol  Hearing loss  Pain   Y Depression  Home Safety  Sexual Health   Diabetes  Lack of physical activity  Stress   Difficulty with daily activities Y Loneliness  Tiredness   Drug use  Medicines  Tobacco use   Falls  Motor Vehicle Safety Y Weight   Food choices  Oral Health  Other    YOUR PERSONALIZED HEALTH PLAN : 1. Schedule your next subsequent Medicare Wellness visit in one year 2. Attend all of your regular appointments to address your medical issues 3. Complete the preventative screenings and services   Annual Wellness Visit   Medicare Covered Preventative Screenings and [place] Men and Women Who How Often Need? Date of Last Service Action  Abdominal Aortic Aneurysm Adults with AAA risk factors Once     Alcohol Misuse and Counseling All Adults Screening once a year if no alcohol misuse. Counseling up to 4 face to face sessions.     Bone Density Measurement  Adults at risk for osteoporosis Once every 2 yrs     Lipid Panel Z13.6 All adults without CV disease Once every 5 yrs     Colorectal Cancer   Stool sample or  Colonoscopy All adults 53 and older   Once every year  Every 10 years     Depression All Adults Once a year  Today   Diabetes Screening Blood glucose, post glucose load, or GTT Z13.1  All adults at risk  Pre-diabetics  Once per year  Twice per year     Diabetes  Self-Management Training All adults Diabetics 10 hrs first year; 2 hours subsequent years. Requires Copay     Glaucoma  Diabetics  Family history of glaucoma  African Americans 41 yrs +  Hispanic Americans 78 yrs + Annually - requires coppay     Hepatitis C Z72.89 or F19.20  High Risk for HCV  Born between 1945 and 1965  Annually  Once     HIV Z11.4 All adults based on risk  Annually btw ages 91 & 40 regardless of risk  Annually > 65 yrs if at increased risk     Lung Cancer Screening Asymptomatic adults aged  36-77 with 30 pack yr history and current smoker OR quit within the last 15 yrs Annually Must have counseling and shared decision making documentation before first screen     Medical Nutrition Therapy Adults with   Diabetes  Renal disease  Kidney transplant within past 3 yrs 3 hours first year; 2 hours subsequent years     Obesity and Counseling All adults Screening once a year Counseling if BMI 30 or higher  Today   Tobacco Use Counseling Adults who use tobacco  Up to 8 visits in one year     Vaccines Z23  Hepatitis B  Influenza   Pneumonia  Adults   Once  Once every flu season  Two different vaccines separated by one year Y Influenza today   Next Annual Wellness Visit People with Medicare Every year  Today     Services & Screenings Women Who How Often Need  Date of Last Service Action  Mammogram  Z12.31 Women over 58 One baseline ages 25-39. Annually ager 16 yrs+ Y 08/07/2017   Pap tests All women Annually if high risk. Every 2 yrs for normal risk women     Screening for cervical cancer with   Pap (Z01.419 nl or Z01.411abnl) &  HPV Z11.51 Women aged 51 to 43 Once every 5 yrs     Screening pelvic and breast exams All women Annually if high risk. Every 2 yrs for normal risk women     Sexually Transmitted Diseases  Chlamydia  Gonorrhea  Syphilis All at risk adults Annually for non pregnant females at increased risk         [place] Men Who How Ofter Need  Date of Last Service Action  Prostate Cancer - DRE & PSA Men over 50 Annually.  DRE might require a copay.     Sexually Transmitted Diseases  Syphilis All at risk adults Annually for men at increased risk

## 2019-08-18 ENCOUNTER — Ambulatory Visit (INDEPENDENT_AMBULATORY_CARE_PROVIDER_SITE_OTHER): Payer: PPO | Admitting: Licensed Clinical Social Worker

## 2019-08-18 ENCOUNTER — Other Ambulatory Visit: Payer: Self-pay

## 2019-08-18 ENCOUNTER — Encounter: Payer: Self-pay | Admitting: Licensed Clinical Social Worker

## 2019-08-18 DIAGNOSIS — F321 Major depressive disorder, single episode, moderate: Secondary | ICD-10-CM

## 2019-08-18 NOTE — BH Specialist Note (Signed)
Integrated Behavioral Health Follow Up Visit  MRN: [id number] Name: Sarah [name]  Session Start time: 10:00  Session End time: 10:50 Total time: 50  minutes  Type of Service: Integrated Behavioral Health- Individual Interpretor:No.  SUBJECTIVE: ORTENSIA [name] is a 74 y.o. female  whom attended the session individually. Patient was referred by Dr. Dareen Piano for depression. Patient reports the following symptoms/concerns: depression, negative thoughts about the past, lack of support, agitation, and family dynamic issues.  Duration of problem: increased over the past several years; Severity of problem: moderate  OBJECTIVE: Mood: Depressed and Affect: Tearful Risk of harm to self or others: No plan to harm self or others  GOALS ADDRESSED: Patient will: 1.  Reduce symptoms of: agitation, anxiety, depression and stress  2.  Increase knowledge and/or ability of: coping skills, healthy habits and stress reduction  3.  Demonstrate ability to: Increase healthy adjustment to current life circumstances and Increase adequate support systems for patient/family  INTERVENTIONS: Interventions utilized:  Motivational Interviewing, Brief CBT and Supportive Counseling Standardized Assessments completed: Not Needed  ASSESSMENT: Patient currently experiencing moderate levels of depression and stress.  Patient is continuing to process her frustration and sadness related to her dynamics with her spouse. Patient processed feelings of betrayal and has anger towards herself at times. Patient feels a great responsibility to be her spouse's care taker. Patient reported she is tired physically and emotionally. Patient is continuing to struggle with sadness and irritation.     Patient may benefit from counseling.  PLAN: 1. Follow up with behavioral health clinician on : one month.  Dessie Coma, [facility], [place]

## 2019-08-31 ENCOUNTER — Other Ambulatory Visit: Payer: Self-pay | Admitting: Internal Medicine

## 2019-08-31 DIAGNOSIS — E559 Vitamin D deficiency, unspecified: Secondary | ICD-10-CM

## 2019-08-31 DIAGNOSIS — I10 Essential (primary) hypertension: Secondary | ICD-10-CM

## 2019-08-31 DIAGNOSIS — F322 Major depressive disorder, single episode, severe without psychotic features: Secondary | ICD-10-CM

## 2019-08-31 DIAGNOSIS — G8929 Other chronic pain: Secondary | ICD-10-CM

## 2019-08-31 DIAGNOSIS — E785 Hyperlipidemia, unspecified: Secondary | ICD-10-CM

## 2019-08-31 MED ORDER — GABAPENTIN 300 MG PO CAPS: ORAL_CAPSULE | ORAL | 1 refills | Status: DC

## 2019-08-31 MED ORDER — HYDROCHLOROTHIAZIDE 12.5 MG PO TABS: 12.5000 mg | ORAL_TABLET | Freq: Every day | ORAL | 1 refills | Status: DC

## 2019-08-31 MED ORDER — AMLODIPINE BESYLATE 2.5 MG PO TABS: 2.5000 mg | ORAL_TABLET | Freq: Every day | ORAL | 1 refills | Status: DC

## 2019-08-31 MED ORDER — ATORVASTATIN CALCIUM 10 MG PO TABS: 10.0000 mg | ORAL_TABLET | Freq: Every day | ORAL | 1 refills | Status: DC

## 2019-08-31 MED ORDER — VITAMIN D 50 MCG (2000 UT) PO CAPS: 2000.0000 [IU] | ORAL_CAPSULE | Freq: Every day | ORAL | 0 refills | Status: DC

## 2019-08-31 MED ORDER — DICLOFENAC SODIUM 1 % EX GEL: 4.0000 g | Freq: Four times a day (QID) | CUTANEOUS | 3 refills | Status: DC

## 2019-08-31 MED ORDER — VENLAFAXINE HCL ER 150 MG PO CP24: 300.0000 mg | ORAL_CAPSULE | Freq: Every day | ORAL | 1 refills | Status: DC

## 2019-08-31 NOTE — Telephone Encounter (Signed)
Also wants diclofenac 1% gel.

## 2019-08-31 NOTE — Telephone Encounter (Signed)
Refill request   amLODipine (NORVASC) 2.5 MG tablet     atorvastatin (LIPITOR) 10 MG tablet    Cholecalciferol (VITAMIN D) 50 MCG (2000 UT) CAPS    diclofenac sodium (VOLTAREN) 1 % GEL    gabapentin (NEURONTIN) 300 MG capsule    hydrochlorothiazide (HYDRODIURIL) 12.5 MG tablet    psyllium (METAMUCIL SMOOTH TEXTURE) 28 % packet    venlafaxine XR (EFFEXOR-XR) 150 MG 24 hr capsule    [facility] - [place], [place]

## 2019-09-14 ENCOUNTER — Encounter: Payer: Self-pay | Admitting: Licensed Clinical Social Worker

## 2019-09-14 ENCOUNTER — Ambulatory Visit (INDEPENDENT_AMBULATORY_CARE_PROVIDER_SITE_OTHER): Payer: PPO | Admitting: Licensed Clinical Social Worker

## 2019-09-14 DIAGNOSIS — F321 Major depressive disorder, single episode, moderate: Secondary | ICD-10-CM

## 2019-09-14 NOTE — BH Specialist Note (Signed)
Integrated Behavioral Health Follow Up Visit  MRN: [id number] Name: Sarah [name]  Session Start time: 10:15  Session End time: 11;00 Total time: 45  minutes  Type of Service: Integrated Behavioral Health- Individual Interpretor:No.   SUBJECTIVE: Sarah [name] is a 74 y.o. female  whom attended the session individually. Patient was referred by Dr. Dareen Piano for depression. Patient reports the following symptoms/concerns: depression, negative thoughts about the past, lack of support, agitation, and family dynamic issues.  Duration of problem: increased over the past several years; Severity of problem: moderate  OBJECTIVE: Mood: Depressed and Affect: Tearful Risk of harm to self or others: No plan to harm self or others, has had fleeting thoughts of not being here (no plan or intent).   LIFE CONTEXT: Family and Social: Patient is continuing to care for her husband, and reports that she has no time to address her own desires/needs/goals. Today is the anniversary of her son's passing. Patient has a plan to celebrate his life.  Self-Care: Area for improvement.  GOALS ADDRESSED: Patient will: 1.  Reduce symptoms of: agitation, anxiety, depression and stress  2.  Increase knowledge and/or ability of: coping skills, healthy habits and stress reduction  3.  Demonstrate ability to: Increase healthy adjustment to current life circumstances, Increase adequate support systems for patient/family and Begin healthy grieving over loss  INTERVENTIONS: Interventions utilized:  Motivational Interviewing, Brief CBT, Supportive Counseling, Link to [employer] and grief counseling. Standardized Assessments completed: assessed for SI, HI, and self-harm.  ASSESSMENT: Patient currently experiencing moderate levels of depression and stress. Patient processed grief related to her son's passing, and her plan to celebrate his life today. Patient processed feelings towards being the sole provider for  her spouse, and how this impacts her ability to care for her own needs and desires. Patient identified future goals for herself, but she does not currently have a tangible plan to put these goals into existence.   Patient processed the relationship with her daughter, and how that has impacted her. Patient has set boundaries with her daughter, but reported this has been an emotionally painful experience for her.    Patient may benefit from counseling.  PLAN: 1. Follow up with behavioral health clinician on : three weeks.   Dessie Coma, [facility], [place]

## 2019-09-17 ENCOUNTER — Other Ambulatory Visit: Payer: Self-pay | Admitting: Internal Medicine

## 2019-09-17 DIAGNOSIS — F322 Major depressive disorder, single episode, severe without psychotic features: Secondary | ICD-10-CM

## 2019-09-17 DIAGNOSIS — I10 Essential (primary) hypertension: Secondary | ICD-10-CM

## 2019-09-17 DIAGNOSIS — E785 Hyperlipidemia, unspecified: Secondary | ICD-10-CM

## 2019-09-19 NOTE — Telephone Encounter (Signed)
All these meds were filled on 2/17. She shuld have them at the pharmacy

## 2019-09-23 MED ORDER — VENLAFAXINE HCL ER 150 MG PO CP24: 300.0000 mg | ORAL_CAPSULE | Freq: Every day | ORAL | 1 refills | Status: DC

## 2019-09-23 MED ORDER — ATORVASTATIN CALCIUM 10 MG PO TABS: 10.0000 mg | ORAL_TABLET | Freq: Every day | ORAL | 1 refills | Status: DC

## 2019-09-23 MED ORDER — HYDROCHLOROTHIAZIDE 12.5 MG PO TABS: 12.5000 mg | ORAL_TABLET | Freq: Every day | ORAL | 1 refills | Status: DC

## 2019-09-23 NOTE — Telephone Encounter (Signed)
As I wrote in routing comment pt is changing pharmacies and she states [facility] will not transfer her scripts to [facility]

## 2019-10-05 ENCOUNTER — Encounter: Payer: Self-pay | Admitting: Licensed Clinical Social Worker

## 2019-10-05 ENCOUNTER — Ambulatory Visit (INDEPENDENT_AMBULATORY_CARE_PROVIDER_SITE_OTHER): Payer: PPO | Admitting: Licensed Clinical Social Worker

## 2019-10-05 DIAGNOSIS — F321 Major depressive disorder, single episode, moderate: Secondary | ICD-10-CM

## 2019-10-05 NOTE — BH Specialist Note (Signed)
Integrated Behavioral Health Follow Up Visit  MRN: [id number] Name: Sarah [name]  Session Start time: 10:15  Session End time: 11:00 Total time: 45  minutes  Type of Service: Integrated Behavioral Health- Individual Interpretor:No.  SUBJECTIVE: Sarah [name] is a 74 y.o. female accompanied by whom attended the session individually. Patient was referred by Dr.Narendra for depression. Patient reports the following symptoms/concerns: depression, negative thoughts about the past, lack of support, agitation, and family dynamic issues. Duration of problem: increased over the past several years; Severity of problem: moderate  OBJECTIVE: Mood: Angry and Depressed and Affect: Depressed and Tearful Risk of harm to self or others: No plan to harm self or others  LIFE CONTEXT: Family and Social: Patient feels emotionally neglected by her family.  Self-Care: Patient expressed a stronger desire to pay attention to her own needs.   GOALS ADDRESSED: Patient will: 1.  Reduce symptoms of: agitation, anxiety, depression, insomnia and stress  2.  Increase knowledge and/or ability of: coping skills, healthy habits and stress reduction  3.  Demonstrate ability to: Increase healthy adjustment to current life circumstances and Increase adequate support systems for patient/family  INTERVENTIONS: Interventions utilized:  Motivational Interviewing, Brief CBT and Supportive Counseling Standardized Assessments completed: assessed for SI, HI, and self-harm.  ASSESSMENT: Patient currently experiencing moderate levels of depression. Patient expressed anger and resentment today. Patient is "tired". Patient processed how she has not allowed herself to live the life she wanted to live, and she is going to try to start making changes in order for her to live her life. Patient identified boundaries that she needs to establish with other family members due to her putting their needs before her own on a regular  basis.    Patient may benefit from counseling.  PLAN: 1. Follow up with behavioral health clinician on : three weeks.  Dessie Coma, [facility], [place]

## 2019-10-18 ENCOUNTER — Encounter: Payer: Self-pay | Admitting: Internal Medicine

## 2019-10-18 ENCOUNTER — Other Ambulatory Visit: Payer: Self-pay

## 2019-10-18 ENCOUNTER — Ambulatory Visit (INDEPENDENT_AMBULATORY_CARE_PROVIDER_SITE_OTHER): Payer: PPO | Admitting: Internal Medicine

## 2019-10-18 DIAGNOSIS — Z0000 Encounter for general adult medical examination without abnormal findings: Secondary | ICD-10-CM

## 2019-10-18 NOTE — Progress Notes (Signed)
Internal Medicine Clinic Attending  I reviewed the AWV findings.  I agree with the assessment, diagnosis, and plan of care documented in the AWV note.     

## 2019-10-18 NOTE — Progress Notes (Signed)
This AWV is being conducted by [facility] only. The patient was located at home and I was located in [facility]. The patient's identity was confirmed using their DOB and current address. The patient or his/her legal guardian has consented to being evaluated through a telephone encounter and understands the associated risks (an examination cannot be done and the patient may need to come in for an appointment) / benefits (allows the patient to remain at home, decreasing exposure to coronavirus). I personally spent 42 minutes conducting the AWV.  Subjective:   Sarah [name] is a 74 y.o. female who presents for a Medicare Annual Wellness Visit.  The following items have been reviewed and updated today in the appropriate area in the EMR.   Health Risk Assessment  Height, weight, BMI, and BP Visual acuity if needed Depression screen Fall risk / safety level Advance directive discussion Medical and family history were reviewed and updated Updating list of other providers & suppliers Medication reconciliation, including over the counter medicines Cognitive screen Written screening schedule Risk Factor list Personalized health advice, risky behaviors, and treatment advice  Social History   Social History Narrative   Current Social History 10/18/2019        Patient lives with spouse in a one level home. There are 2 steps with handrail up to the entrance the patient uses.       Patient's method of transportation is personal car.      The highest level of education was high school diploma.      The patient currently retired.      Identified important Relationships are "My daughters"       Pets : None       Interests / Fun: "Nothing- I take care of my husband, and it's a lot on me."       Current Stressors: Taking care of husband, not receiving help from children or husband's family.        Religious / Personal Beliefs: Baptist       L. Chaos Carlile, BSN, RN-BC                Objective:    Vitals: There were no vitals taken for this visit. Vitals are unable to obtained due to [id number] public health emergency  Activities of Daily Living In your present state of health, do you have any difficulty performing the following activities: 10/18/2019 03/01/2019  Hearing? N N  Vision? N N  Difficulty concentrating or making decisions? N N  Walking or climbing stairs? N N  Dressing or bathing? N N  Doing errands, shopping? N N  Some recent data might be hidden    Fall Risk Fall Risk  10/18/2019 03/01/2019 06/15/2018 09/15/2017 03/24/2017  Falls in the past year? 0 0 0 No No  Number falls in past yr: - 0 - - -  Comment - - - - -  Injury with Fall? - - - - -  Comment - - - - -  Risk Factor Category  - - - - -  Risk for fall due to : No Fall Risks History of fall(s) - - Impaired balance/gait  Risk for fall due to: Comment - - - - -  Follow up Education provided;Falls prevention discussed - - - -       Depression Screen PHQ 2/9 Scores 10/18/2019 03/01/2019 06/15/2018 09/15/2017  PHQ - 2 Score 6 3 6 6   PHQ- 9 Score 27 13 21  19  Patient denies suicidal thoughts at present and has no plan to harm herself. Patient has appt with [facility] on 11/01/2019. She did not feel she needed to make a sooner appt. Patient also receives counseling from her Doristine Bosworth. She would like a call from [facility] to discuss placement for husband.    Cognitive Testing Six-Item Cognitive Screener   "I would like to ask you some questions that ask you to use your memory. I am going to name three objects. Please wait until I say all three words, then repeat them. Remember what they are  because I am going to ask you to name them again in a few minutes. Please repeat these words for me: APPLE--TABLE--PENNY." (Interviewer may repeat names 3 times if necessary but repetition not scored.)  Did patient correctly repeat all three words? Yes - may proceed with screen  What year is this? Correct What month is  this? Correct What day of the week is this? Correct  What were the three objects I asked you to remember? . Apple Correct . Table Correct . Penny Correct  Score one point for each incorrect answer.  A score of 2 or more points warrants additional investigation.  Patient's score 0    Assessment and Plan:     Patient denies suicidal thoughts at present and has no plan to harm herself. Patient has appt with [facility] on 11/01/2019. She did not feel she needed to make a sooner appt. Patient also receives counseling from her Doristine Bosworth. She would like a call from [facility] to discuss placement for husband.  Patient will call Dr. Kelby Fam office to schedule mammogram Patient will begin seated and standing exercises with exercise band.   During the course of the visit the patient was educated and counseled about appropriate screening and preventive services as documented in the assessment and plan.  The printed AVS was given to the patient and included an updated screening schedule, a list of risk factors, and personalized health advice.        Velora Heckler, RN  10/18/2019

## 2019-10-18 NOTE — Patient Instructions (Addendum)
Annual Wellness Visit   Medicare Covered Preventative Screenings and Services  Services & Screenings Men and Women Who How Often Need? Date of Last Service Action  Abdominal Aortic Aneurysm Adults with AAA risk factors Once     Alcohol Misuse and Counseling All Adults Screening once a year if no alcohol misuse. Counseling up to 4 face to face sessions.     Bone Density Measurement  Adults at risk for osteoporosis Once every 2 yrs     Lipid Panel Z13.6 All adults without CV disease Once every 5 yrs     Colorectal Cancer   Stool sample or  Colonoscopy All adults 38 and older   Once every year  Every 10 years     Depression All Adults Once a year  Today   Diabetes Screening Blood glucose, post glucose load, or GTT Z13.1  All adults at risk  Pre-diabetics  Once per year  Twice per year     Diabetes  Self-Management Training All adults Diabetics 10 hrs first year; 2 hours subsequent years. Requires Copay     Glaucoma  Diabetics  Family history of glaucoma  African Americans 68 yrs +  Hispanic Americans 76 yrs + Annually - requires coppay     Hepatitis C Z72.89 or F19.20  High Risk for HCV  Born between 1945 and 1965  Annually  Once     HIV Z11.4 All adults based on risk  Annually btw ages 53 & 44 regardless of risk  Annually > 65 yrs if at increased risk     Lung Cancer Screening Asymptomatic adults aged 2-77 with 30 pack yr history and current smoker OR quit within the last 15 yrs Annually Must have counseling and shared decision making documentation before first screen     Medical Nutrition Therapy Adults with   Diabetes  Renal disease  Kidney transplant within past 3 yrs 3 hours first year; 2 hours subsequent years     Obesity and Counseling All adults Screening once a year Counseling if BMI 30 or higher  Today   Tobacco Use Counseling Adults who use tobacco  Up to 8 visits in one year     Vaccines Z23  Hepatitis B  Influenza   Pneumonia  Adults    Once  Once every flu season  Two different vaccines separated by one year   Flu vaccine yearly starting September 1  Next Annual Wellness Visit People with Medicare Every year  Today     Services & Screenings Women Who How Often Need  Date of Last Service Action  Mammogram  Z12.31 Women over 25 One baseline ages 55-39. Annually ager 74 yrs+   Call Dr. Kelby Fam office to schedule pap and mammogram  Pap tests All women Annually if high risk. Every 2 yrs for normal risk women     Screening for cervical cancer with   Pap (Z01.419 nl or Z01.411abnl) &  HPV Z11.51 Women aged 11 to 20 Once every 5 yrs     Screening pelvic and breast exams All women Annually if high risk. Every 2 yrs for normal risk women     Sexually Transmitted Diseases  Chlamydia  Gonorrhea  Syphilis All at risk adults Annually for non pregnant females at increased risk         [place] Men Who How Ofter Need  Date of Last Service Action  Prostate Cancer - DRE & PSA Men over 50 Annually.  DRE might require a copay.  Sexually Transmitted Diseases  Syphilis All at risk adults Annually for men at increased risk         Things That May Be Affecting Your Health:  Alcohol  Hearing loss  Pain   X Depression  Home Safety  Sexual Health   Diabetes  Lack of physical activity  Stress   Difficulty with daily activities X Loneliness  Tiredness   Drug use  Medicines  Tobacco use   Falls  Motor Vehicle Safety X Weight   Food choices  Oral Health  Other    YOUR PERSONALIZED HEALTH PLAN : 1. Schedule your next subsequent Medicare Wellness visit in one year 2. Attend all of your regular appointments to address your medical issues 3. Complete the preventative screenings and services 4. Call Dr. Kelby Fam office to schedule pap and mammogram 5. Keep appt with Miquel Dunn on 11/01/2019 at 10:00 6. Begin seated and standing exercises with exercise band.   Fall Prevention in the Home, Adult Falls can  cause injuries. They can happen to people of all ages. There are many things you can do to make your home safe and to help prevent falls. Ask for help when making these changes, if needed. What actions can I take to prevent falls? General Instructions  Use good lighting in all rooms. Replace any light bulbs that burn out.  Turn on the lights when you go into a dark area. Use night-lights.  Keep items that you use often in easy-to-reach places. Lower the shelves around your home if necessary.  Set up your furniture so you have a clear path. Avoid moving your furniture around.  Do not have throw rugs and other things on the floor that can make you trip.  Avoid walking on wet floors.  If any of your floors are uneven, fix them.  Add color or contrast paint or tape to clearly mark and help you see: ? Any grab bars or handrails. ? First and last steps of stairways. ? Where the edge of each step is.  If you use a stepladder: ? Make sure that it is fully opened. Do not climb a closed stepladder. ? Make sure that both sides of the stepladder are locked into place. ? Ask someone to hold the stepladder for you while you use it.  If there are any pets around you, be aware of where they are. What can I do in the bathroom?      Keep the floor dry. Clean up any water that spills onto the floor as soon as it happens.  Remove soap buildup in the tub or shower regularly.  Use non-skid mats or decals on the floor of the tub or shower.  Attach bath mats securely with double-sided, non-slip rug tape.  If you need to sit down in the shower, use a plastic, non-slip stool.  Install grab bars by the toilet and in the tub and shower. Do not use towel bars as grab bars. What can I do in the bedroom?  Make sure that you have a light by your bed that is easy to reach.  Do not use any sheets or blankets that are too big for your bed. They should not hang down onto the floor.  Have a firm chair  that has side arms. You can use this for support while you get dressed. What can I do in the kitchen?  Clean up any spills right away.  If you need to reach something above you, use a strong  step stool that has a grab bar.  Keep electrical cords out of the way.  Do not use floor polish or wax that makes floors slippery. If you must use wax, use non-skid floor wax. What can I do with my stairs?  Do not leave any items on the stairs.  Make sure that you have a light switch at the top of the stairs and the bottom of the stairs. If you do not have them, ask someone to add them for you.  Make sure that there are handrails on both sides of the stairs, and use them. Fix handrails that are broken or loose. Make sure that handrails are as long as the stairways.  Install non-slip stair treads on all stairs in your home.  Avoid having throw rugs at the top or bottom of the stairs. If you do have throw rugs, attach them to the floor with carpet tape.  Choose a carpet that does not hide the edge of the steps on the stairway.  Check any carpeting to make sure that it is firmly attached to the stairs. Fix any carpet that is loose or worn. What can I do on the outside of my home?  Use bright outdoor lighting.  Regularly fix the edges of walkways and driveways and fix any cracks.  Remove anything that might make you trip as you walk through a door, such as a raised step or threshold.  Trim any bushes or trees on the path to your home.  Regularly check to see if handrails are loose or broken. Make sure that both sides of any steps have handrails.  Install guardrails along the edges of any raised decks and porches.  Clear walking paths of anything that might make someone trip, such as tools or rocks.  Have any leaves, snow, or ice cleared regularly.  Use sand or salt on walking paths during winter.  Clean up any spills in your garage right away. This includes grease or oil spills. What  other actions can I take?  Wear shoes that: ? Have a low heel. Do not wear high heels. ? Have rubber bottoms. ? Are comfortable and fit you well. ? Are closed at the toe. Do not wear open-toe sandals.  Use tools that help you move around (mobility aids) if they are needed. These include: ? Canes. ? Walkers. ? Scooters. ? Crutches.  Review your medicines with your doctor. Some medicines can make you feel dizzy. This can increase your chance of falling. Ask your doctor what other things you can do to help prevent falls. Where to find more information  Centers for Disease Control and Prevention, STEADI: https://garcia.biz/  [employer] on Aging: BrainJudge.co.uk Contact a doctor if:  You are afraid of falling at home.  You feel weak, drowsy, or dizzy at home.  You fall at home. Summary  There are many simple things that you can do to make your home safe and to help prevent falls.  Ways to make your home safe include removing tripping hazards and installing grab bars in the bathroom.  Ask for help when making these changes in your home. This information is not intended to replace advice given to you by your health care provider. Make sure you discuss any questions you have with your health care provider. Document Revised: 10/21/2018 Document Reviewed: 02/12/2017 Elsevier Patient Education  2020 [place] Maintenance, Female Adopting a healthy lifestyle and getting preventive care are important in promoting health and wellness.  Ask your health care provider about:  The right schedule for you to have regular tests and exams.  Things you can do on your own to prevent diseases and keep yourself healthy. What should I know about diet, weight, and exercise? Eat a healthy diet   Eat a diet that includes plenty of vegetables, fruits, low-fat dairy products, and lean protein.  Do not eat a lot of foods that are high in solid fats, added sugars, or  sodium. Maintain a healthy weight Body mass index (BMI) is used to identify weight problems. It estimates body fat based on height and weight. Your health care provider can help determine your BMI and help you achieve or maintain a healthy weight. Get regular exercise Get regular exercise. This is one of the most important things you can do for your health. Most adults should:  Exercise for at least 150 minutes each week. The exercise should increase your heart rate and make you sweat (moderate-intensity exercise).  Do strengthening exercises at least twice a week. This is in addition to the moderate-intensity exercise.  Spend less time sitting. Even light physical activity can be beneficial. Watch cholesterol and blood lipids Have your blood tested for lipids and cholesterol at 73 years of age, then have this test every 5 years. Have your cholesterol levels checked more often if:  Your lipid or cholesterol levels are high.  You are older than 73 years of age.  You are at high risk for heart disease. What should I know about cancer screening? Depending on your health history and family history, you may need to have cancer screening at various ages. This may include screening for:  Breast cancer.  Cervical cancer.  Colorectal cancer.  Skin cancer.  Lung cancer. What should I know about heart disease, diabetes, and high blood pressure? Blood pressure and heart disease  High blood pressure causes heart disease and increases the risk of stroke. This is more likely to develop in people who have high blood pressure readings, are of African descent, or are overweight.  Have your blood pressure checked: ? Every 3-5 years if you are 78-88 years of age. ? Every year if you are 5 years old or older. Diabetes Have regular diabetes screenings. This checks your fasting blood sugar level. Have the screening done:  Once every three years after age 11 if you are at a normal weight and have  a low risk for diabetes.  More often and at a younger age if you are overweight or have a high risk for diabetes. What should I know about preventing infection? Hepatitis B If you have a higher risk for hepatitis B, you should be screened for this virus. Talk with your health care provider to find out if you are at risk for hepatitis B infection. Hepatitis C Testing is recommended for:  Everyone born from [birth year] through 1964.  Anyone with known risk factors for hepatitis C. Sexually transmitted infections (STIs)  Get screened for STIs, including gonorrhea and chlamydia, if: ? You are sexually active and are younger than 73 years of age. ? You are older than 73 years of age and your health care provider tells you that you are at risk for this type of infection. ? Your sexual activity has changed since you were last screened, and you are at increased risk for chlamydia or gonorrhea. Ask your health care provider if you are at risk.  Ask your health care provider about whether you are at high risk  for HIV. Your health care provider may recommend a prescription medicine to help prevent HIV infection. If you choose to take medicine to prevent HIV, you should first get tested for HIV. You should then be tested every 3 months for as long as you are taking the medicine. Pregnancy  If you are about to stop having your period (premenopausal) and you may become pregnant, seek counseling before you get pregnant.  Take 400 to 800 micrograms (mcg) of folic acid every day if you become pregnant.  Ask for birth control (contraception) if you want to prevent pregnancy. Osteoporosis and menopause Osteoporosis is a disease in which the bones lose minerals and strength with aging. This can result in bone fractures. If you are 64 years old or older, or if you are at risk for osteoporosis and fractures, ask your health care provider if you should:  Be screened for bone loss.  Take a calcium or vitamin D  supplement to lower your risk of fractures.  Be given hormone replacement therapy (HRT) to treat symptoms of menopause. Follow these instructions at home: Lifestyle  Do not use any products that contain nicotine or tobacco, such as cigarettes, e-cigarettes, and chewing tobacco. If you need help quitting, ask your health care provider.  Do not use street drugs.  Do not share needles.  Ask your health care provider for help if you need support or information about quitting drugs. Alcohol use  Do not drink alcohol if: ? Your health care provider tells you not to drink. ? You are pregnant, may be pregnant, or are planning to become pregnant.  If you drink alcohol: ? Limit how much you use to 0-1 drink a day. ? Limit intake if you are breastfeeding.  Be aware of how much alcohol is in your drink. In the [place] one drink equals one 12 oz bottle of beer (355 mL), one 5 oz glass of wine (148 mL), or one 1 oz glass of hard liquor (44 mL). General instructions  Schedule regular health, dental, and eye exams.  Stay current with your vaccines.  Tell your health care provider if: ? You often feel depressed. ? You have ever been abused or do not feel safe at home. Summary  Adopting a healthy lifestyle and getting preventive care are important in promoting health and wellness.  Follow your health care provider's instructions about healthy diet, exercising, and getting tested or screened for diseases.  Follow your health care provider's instructions on monitoring your cholesterol and blood pressure. This information is not intended to replace advice given to you by your health care provider. Make sure you discuss any questions you have with your health care provider. Document Revised: 06/23/2018 Document Reviewed: 06/23/2018 Elsevier Patient Education  2020 [employer].

## 2019-10-18 NOTE — Progress Notes (Signed)
I discussed the AWV findings with the RN who conducted the visit. I was present in the office suite and immediately available to provide assistance and direction throughout the time the service was provided.   

## 2019-10-19 NOTE — Progress Notes (Signed)
Internal Medicine Clinic Attending  Case discussed with Dr. Ronnald Ramp We reviewed the AWV findings.  I agree with the assessment, diagnosis, and plan of care documented in the AWV note.

## 2019-10-20 ENCOUNTER — Other Ambulatory Visit: Payer: Self-pay | Admitting: Internal Medicine

## 2019-10-20 ENCOUNTER — Ambulatory Visit: Payer: PPO | Attending: Internal Medicine

## 2019-10-20 DIAGNOSIS — Z23 Encounter for immunization: Secondary | ICD-10-CM

## 2019-10-20 NOTE — Progress Notes (Signed)
   Covid-19 Vaccination Clinic  Name:  Sarah [name]    MRN: [id number] DOB: 1946-01-21  10/20/2019  [name] was observed post Covid-19 immunization for 15 minutes without incident. She was provided with Vaccine Information Sheet and instruction to access the V-Safe system.   [name] was instructed to call 911 with any severe reactions post vaccine: Marland Kitchen Difficulty breathing  . Swelling of face and throat  . A fast heartbeat  . A bad rash all over body  . Dizziness and weakness   Immunizations Administered    Name Date Dose VIS Date Route   Pfizer COVID-19 Vaccine 10/20/2019  9:45 AM 0.3 mL 06/24/2019 Intramuscular   Manufacturer: [employer], [employer]   Lot: [id number]   [place]: [id number]

## 2019-11-01 ENCOUNTER — Ambulatory Visit: Payer: PPO | Admitting: Licensed Clinical Social Worker

## 2019-11-14 ENCOUNTER — Ambulatory Visit: Payer: PPO | Attending: Internal Medicine

## 2019-11-14 DIAGNOSIS — Z23 Encounter for immunization: Secondary | ICD-10-CM

## 2019-11-14 NOTE — Progress Notes (Signed)
   Covid-19 Vaccination Clinic  Name:  Sarah [name]    MRN: [id number] DOB: [birth year]  11/14/2019  [name] was observed post Covid-19 immunization for 15 minutes without incident. She was provided with Vaccine Information Sheet and instruction to access the V-Safe system.   [name] was instructed to call 911 with any severe reactions post vaccine: Marland Kitchen Difficulty breathing  . Swelling of face and throat  . A fast heartbeat  . A bad rash all over body  . Dizziness and weakness   Immunizations Administered    Name Date Dose VIS Date Route   Pfizer COVID-19 Vaccine 11/14/2019 10:46 AM 0.3 mL 09/07/2018 Intramuscular   Manufacturer: [place]   Lot: [id number]   [place]: [id number]

## 2020-02-02 ENCOUNTER — Other Ambulatory Visit: Payer: Self-pay | Admitting: Internal Medicine

## 2020-02-02 DIAGNOSIS — G8929 Other chronic pain: Secondary | ICD-10-CM

## 2020-04-06 ENCOUNTER — Other Ambulatory Visit: Payer: Self-pay

## 2020-04-06 ENCOUNTER — Encounter (HOSPITAL_COMMUNITY): Payer: Self-pay | Admitting: Emergency Medicine

## 2020-04-06 ENCOUNTER — Inpatient Hospital Stay (HOSPITAL_COMMUNITY)
Admission: EM | Admit: 2020-04-06 | Discharge: 2020-04-11 | DRG: 535 | Disposition: A | Discharge status: Home Health | Payer: PPO | Attending: Student in an Organized Health Care Education/Training Program | Admitting: Student in an Organized Health Care Education/Training Program

## 2020-04-06 ENCOUNTER — Emergency Department (HOSPITAL_COMMUNITY): Payer: PPO

## 2020-04-06 DIAGNOSIS — E7800 Pure hypercholesterolemia, unspecified: Secondary | ICD-10-CM | POA: Diagnosis not present

## 2020-04-06 DIAGNOSIS — Z780 Asymptomatic menopausal state: Secondary | ICD-10-CM

## 2020-04-06 DIAGNOSIS — E559 Vitamin D deficiency, unspecified: Secondary | ICD-10-CM | POA: Diagnosis not present

## 2020-04-06 DIAGNOSIS — S3282XA Multiple fractures of pelvis without disruption of pelvic ring, initial encounter for closed fracture: Secondary | ICD-10-CM | POA: Diagnosis not present

## 2020-04-06 DIAGNOSIS — S3992XA Unspecified injury of lower back, initial encounter: Secondary | ICD-10-CM | POA: Diagnosis not present

## 2020-04-06 DIAGNOSIS — T1490XA Injury, unspecified, initial encounter: Secondary | ICD-10-CM | POA: Diagnosis present

## 2020-04-06 DIAGNOSIS — Z6841 Body Mass Index (BMI) 40.0 and over, adult: Secondary | ICD-10-CM | POA: Diagnosis not present

## 2020-04-06 DIAGNOSIS — Z820 Family history of epilepsy and other diseases of the nervous system: Secondary | ICD-10-CM

## 2020-04-06 DIAGNOSIS — M25552 Pain in left hip: Secondary | ICD-10-CM | POA: Diagnosis not present

## 2020-04-06 DIAGNOSIS — S32402A Unspecified fracture of left acetabulum, initial encounter for closed fracture: Secondary | ICD-10-CM | POA: Diagnosis not present

## 2020-04-06 DIAGNOSIS — S329XXA Fracture of unspecified parts of lumbosacral spine and pelvis, initial encounter for closed fracture: Secondary | ICD-10-CM | POA: Diagnosis present

## 2020-04-06 DIAGNOSIS — S299XXA Unspecified injury of thorax, initial encounter: Secondary | ICD-10-CM

## 2020-04-06 DIAGNOSIS — Y92410 Unspecified street and highway as the place of occurrence of the external cause: Secondary | ICD-10-CM

## 2020-04-06 DIAGNOSIS — E669 Obesity, unspecified: Secondary | ICD-10-CM | POA: Diagnosis not present

## 2020-04-06 DIAGNOSIS — S32415A Nondisplaced fracture of anterior wall of left acetabulum, initial encounter for closed fracture: Secondary | ICD-10-CM

## 2020-04-06 DIAGNOSIS — D259 Leiomyoma of uterus, unspecified: Secondary | ICD-10-CM | POA: Diagnosis not present

## 2020-04-06 DIAGNOSIS — Z79899 Other long term (current) drug therapy: Secondary | ICD-10-CM | POA: Diagnosis not present

## 2020-04-06 DIAGNOSIS — Z20822 Contact with and (suspected) exposure to covid-19: Secondary | ICD-10-CM | POA: Diagnosis present

## 2020-04-06 DIAGNOSIS — F419 Anxiety disorder, unspecified: Secondary | ICD-10-CM | POA: Diagnosis present

## 2020-04-06 DIAGNOSIS — Z8249 Family history of ischemic heart disease and other diseases of the circulatory system: Secondary | ICD-10-CM

## 2020-04-06 DIAGNOSIS — S32592A Other specified fracture of left pubis, initial encounter for closed fracture: Secondary | ICD-10-CM | POA: Diagnosis not present

## 2020-04-06 DIAGNOSIS — S0990XA Unspecified injury of head, initial encounter: Secondary | ICD-10-CM | POA: Diagnosis not present

## 2020-04-06 DIAGNOSIS — E876 Hypokalemia: Secondary | ICD-10-CM | POA: Diagnosis not present

## 2020-04-06 DIAGNOSIS — E785 Hyperlipidemia, unspecified: Secondary | ICD-10-CM | POA: Diagnosis present

## 2020-04-06 DIAGNOSIS — Z833 Family history of diabetes mellitus: Secondary | ICD-10-CM | POA: Diagnosis not present

## 2020-04-06 DIAGNOSIS — I10 Essential (primary) hypertension: Secondary | ICD-10-CM | POA: Diagnosis not present

## 2020-04-06 DIAGNOSIS — G4733 Obstructive sleep apnea (adult) (pediatric): Secondary | ICD-10-CM | POA: Diagnosis not present

## 2020-04-06 DIAGNOSIS — R52 Pain, unspecified: Secondary | ICD-10-CM | POA: Diagnosis not present

## 2020-04-06 DIAGNOSIS — M62830 Muscle spasm of back: Secondary | ICD-10-CM

## 2020-04-06 DIAGNOSIS — S199XXA Unspecified injury of neck, initial encounter: Secondary | ICD-10-CM | POA: Diagnosis not present

## 2020-04-06 DIAGNOSIS — S32502A Unspecified fracture of left pubis, initial encounter for closed fracture: Secondary | ICD-10-CM | POA: Diagnosis not present

## 2020-04-06 DIAGNOSIS — Z041 Encounter for examination and observation following transport accident: Secondary | ICD-10-CM | POA: Diagnosis not present

## 2020-04-06 DIAGNOSIS — M545 Low back pain: Secondary | ICD-10-CM | POA: Diagnosis not present

## 2020-04-06 DIAGNOSIS — S32492A Other specified fracture of left acetabulum, initial encounter for closed fracture: Secondary | ICD-10-CM | POA: Diagnosis not present

## 2020-04-06 MED ORDER — MORPHINE SULFATE (PF) 4 MG/ML IV SOLN
4.0000 mg | Freq: Once | INTRAVENOUS | Status: AC
  Administered 2020-04-06: 4 mg via INTRAVENOUS
  Filled 2020-04-06: qty 1
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
## 2020-04-07 ENCOUNTER — Emergency Department (HOSPITAL_COMMUNITY): Payer: PPO

## 2020-04-07 DIAGNOSIS — S329XXA Fracture of unspecified parts of lumbosacral spine and pelvis, initial encounter for closed fracture: Secondary | ICD-10-CM | POA: Diagnosis present

## 2020-04-07 HISTORY — DX: Fracture of unspecified parts of lumbosacral spine and pelvis, initial encounter for closed fracture: S32.9XXA

## 2020-04-07 LAB — HEPATIC FUNCTION PANEL
ALT: 21 U/L (ref 0–44)
AST: 26 U/L (ref 15–41)
Albumin: 3.4 g/dL — ABNORMAL LOW (ref 3.5–5.0)
Alkaline Phosphatase: 93 U/L (ref 38–126)
Bilirubin, Direct: 0.1 mg/dL (ref 0.0–0.2)
Indirect Bilirubin: 0.6 mg/dL (ref 0.3–0.9)
Total Bilirubin: 0.7 mg/dL (ref 0.3–1.2)
Total Protein: 6.8 g/dL (ref 6.5–8.1)

## 2020-04-07 LAB — BASIC METABOLIC PANEL
Anion gap: 13 (ref 5–15)
BUN: 8 mg/dL (ref 8–23)
CO2: 30 mmol/L (ref 22–32)
Calcium: 8.5 mg/dL — ABNORMAL LOW (ref 8.9–10.3)
Chloride: 100 mmol/L (ref 98–111)
Creatinine, Ser: 0.92 mg/dL (ref 0.44–1.00)
GFR calc Af Amer: >60 mL/min (ref >60)
GFR calc non Af Amer: >60 mL/min (ref >60)
Glucose, Bld: 124 mg/dL — ABNORMAL HIGH (ref 70–99)
Potassium: 2.5 mmol/L — CL (ref 3.5–5.1)
Sodium: 143 mmol/L (ref 135–145)

## 2020-04-07 LAB — CBC
HCT: 38.7 % (ref 36.0–46.0)
HCT: 35.1 % — ABNORMAL LOW (ref 36.0–46.0)
Hemoglobin: 12.2 g/dL (ref 12.0–15.0)
Hemoglobin: 11.2 g/dL — ABNORMAL LOW (ref 12.0–15.0)
MCH: 30.3 pg (ref 26.0–34.0)
MCH: 29.9 pg (ref 26.0–34.0)
MCHC: 31.5 g/dL (ref 30.0–36.0)
MCHC: 31.9 g/dL (ref 30.0–36.0)
MCV: 96 fL (ref 80.0–100.0)
MCV: 93.6 fL (ref 80.0–100.0)
Platelets: 218 10*3/uL (ref 150–400)
Platelets: 194 10*3/uL (ref 150–400)
RBC: 4.03 MIL/uL (ref 3.87–5.11)
RBC: 3.75 MIL/uL — ABNORMAL LOW (ref 3.87–5.11)
RDW: 13.5 % (ref 11.5–15.5)
RDW: 13.5 % (ref 11.5–15.5)
WBC: 9 10*3/uL (ref 4.0–10.5)
WBC: 9.8 10*3/uL (ref 4.0–10.5)
nRBC: 0 % (ref 0.0–0.2)
nRBC: 0 % (ref 0.0–0.2)

## 2020-04-07 LAB — RESPIRATORY PANEL BY RT PCR (FLU A&B, COVID)
Influenza A by PCR: NEGATIVE
Influenza B by PCR: NEGATIVE
SARS Coronavirus 2 by RT PCR: NEGATIVE

## 2020-04-07 LAB — CREATININE, SERUM
Creatinine, Ser: 0.9 mg/dL (ref 0.44–1.00)
GFR calc Af Amer: >60 mL/min (ref >60)
GFR calc non Af Amer: >60 mL/min (ref >60)

## 2020-04-07 LAB — CK: Total CK: 264 U/L — ABNORMAL HIGH (ref 38–234)

## 2020-04-07 LAB — MAGNESIUM: Magnesium: 2.1 mg/dL (ref 1.7–2.4)

## 2020-04-07 LAB — PHOSPHORUS: Phosphorus: 3.2 mg/dL (ref 2.5–4.6)

## 2020-04-07 LAB — VITAMIN D 25 HYDROXY (VIT D DEFICIENCY, FRACTURES): Vit D, 25-Hydroxy: 25.85 ng/mL — ABNORMAL LOW (ref 30–100)

## 2020-04-07 MED ORDER — VENLAFAXINE HCL ER 150 MG PO CP24
300.0000 mg | ORAL_CAPSULE | Freq: Every day | ORAL | Status: DC
  Administered 2020-04-07 – 2020-04-11 (×5): 300 mg via ORAL
  Filled 2020-04-07 (×6): qty 2

## 2020-04-07 MED ORDER — POTASSIUM CHLORIDE CRYS ER 20 MEQ PO TBCR: 40.0000 meq | EXTENDED_RELEASE_TABLET | Freq: Once | ORAL | Status: DC

## 2020-04-07 MED ORDER — POLYETHYLENE GLYCOL 3350 17 G PO PACK: 17.0000 g | PACK | Freq: Every day | ORAL | Status: DC | PRN

## 2020-04-07 MED ORDER — HYDROCODONE-ACETAMINOPHEN 5-325 MG PO TABS
1.0000 | ORAL_TABLET | ORAL | Status: DC | PRN
  Administered 2020-04-07: 2 via ORAL
  Administered 2020-04-07: 1 via ORAL
  Administered 2020-04-07 – 2020-04-08 (×4): 2 via ORAL
  Administered 2020-04-09: 1 via ORAL
  Administered 2020-04-09: 2 via ORAL
  Filled 2020-04-07 (×2): qty 2
  Filled 2020-04-07: qty 1
  Filled 2020-04-07 (×5): qty 2

## 2020-04-07 MED ORDER — VITAMIN D 50 MCG (2000 UT) PO CAPS: 2000.0000 [IU] | ORAL_CAPSULE | Freq: Every day | ORAL | Status: DC

## 2020-04-07 MED ORDER — ONDANSETRON HCL 4 MG PO TABS
4.0000 mg | ORAL_TABLET | Freq: Four times a day (QID) | ORAL | Status: DC | PRN
  Administered 2020-04-07: 4 mg via ORAL
  Filled 2020-04-07: qty 1

## 2020-04-07 MED ORDER — ACETAMINOPHEN 325 MG PO TABS: 650.0000 mg | ORAL_TABLET | Freq: Four times a day (QID) | ORAL | Status: DC | PRN

## 2020-04-07 MED ORDER — GABAPENTIN 300 MG PO CAPS
300.0000 mg | ORAL_CAPSULE | Freq: Two times a day (BID) | ORAL | Status: DC
  Administered 2020-04-07 – 2020-04-11 (×9): 300 mg via ORAL
  Filled 2020-04-07 (×9): qty 1

## 2020-04-07 MED ORDER — SENNA 8.6 MG PO TABS
1.0000 | ORAL_TABLET | Freq: Two times a day (BID) | ORAL | Status: DC
  Administered 2020-04-07 – 2020-04-11 (×8): 8.6 mg via ORAL
  Filled 2020-04-07 (×8): qty 1

## 2020-04-07 MED ORDER — ATORVASTATIN CALCIUM 10 MG PO TABS
10.0000 mg | ORAL_TABLET | Freq: Every day | ORAL | Status: DC
  Administered 2020-04-07 – 2020-04-11 (×5): 10 mg via ORAL
  Filled 2020-04-07 (×5): qty 1

## 2020-04-07 MED ORDER — ACETAMINOPHEN 650 MG RE SUPP: 650.0000 mg | Freq: Four times a day (QID) | RECTAL | Status: DC | PRN

## 2020-04-07 MED ORDER — POTASSIUM CHLORIDE CRYS ER 20 MEQ PO TBCR
40.0000 meq | EXTENDED_RELEASE_TABLET | ORAL | Status: AC
  Administered 2020-04-07: 40 meq via ORAL
  Filled 2020-04-07: qty 2

## 2020-04-07 MED ORDER — ENOXAPARIN SODIUM 60 MG/0.6ML ~~LOC~~ SOLN
55.0000 mg | SUBCUTANEOUS | Status: DC
  Administered 2020-04-07 – 2020-04-11 (×5): 55 mg via SUBCUTANEOUS
  Filled 2020-04-07: qty 0.55
  Filled 2020-04-07 (×4): qty 0.6

## 2020-04-07 MED ORDER — HYDROCHLOROTHIAZIDE 25 MG PO TABS
12.5000 mg | ORAL_TABLET | Freq: Every day | ORAL | Status: DC
  Administered 2020-04-07 – 2020-04-11 (×5): 12.5 mg via ORAL
  Filled 2020-04-07 (×5): qty 1

## 2020-04-07 MED ORDER — ONDANSETRON HCL 4 MG/2ML IJ SOLN: 4.0000 mg | Freq: Four times a day (QID) | INTRAMUSCULAR | Status: DC | PRN

## 2020-04-07 MED ORDER — IOHEXOL 300 MG/ML  SOLN
100.0000 mL | Freq: Once | INTRAMUSCULAR | Status: AC | PRN
  Administered 2020-04-07: 100 mL via INTRAVENOUS

## 2020-04-07 MED ORDER — ONDANSETRON 4 MG PO TBDP: 4.0000 mg | ORAL_TABLET | Freq: Once | ORAL | Status: DC

## 2020-04-07 MED ORDER — ONDANSETRON HCL 4 MG/2ML IJ SOLN: 4.0000 mg | Freq: Three times a day (TID) | INTRAMUSCULAR | Status: DC | PRN

## 2020-04-07 MED ORDER — SODIUM CHLORIDE 0.9% FLUSH
3.0000 mL | Freq: Two times a day (BID) | INTRAVENOUS | Status: DC
  Administered 2020-04-07 – 2020-04-11 (×8): 3 mL via INTRAVENOUS

## 2020-04-07 MED ORDER — VITAMIN D 25 MCG (1000 UNIT) PO TABS
2000.0000 [IU] | ORAL_TABLET | Freq: Every day | ORAL | Status: DC
  Administered 2020-04-08 – 2020-04-11 (×4): 2000 [IU] via ORAL
  Filled 2020-04-07 (×4): qty 2

## 2020-04-07 MED ORDER — AMLODIPINE BESYLATE 5 MG PO TABS: 2.5000 mg | ORAL_TABLET | Freq: Every day | ORAL | Status: DC
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
## 2020-04-07 NOTE — ED Notes (Signed)
Pt reports continued nausea at this time. Will consult admitting for antiemetics before PO meds given

## 2020-04-07 NOTE — Progress Notes (Signed)
Patient arrived to unit a/o/vx4.  Skin clean/dry/intact. Patient oriented to room. V/S taken. Will continue to monitor.
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
## 2020-04-07 NOTE — ED Notes (Signed)
Attempt made to ambulate pt with walker, pt was unable to sit her self up in bed, pt's legs moved to floor by staff, pt then able to move to standing position with assistance holding walker. Pt unable to lift or step with L leg due to pain. Pt became lightheaded and nauseated, pt assisted to seated position on stretcher, pt then began to tilt forward eyes open but not responding to voice or light stimulation. Pt assisted by 2 staff members to a supine position, pt then able to respond to verbal stimuli.  MD aware. Side rails up for safety, call bell within reach.

## 2020-04-08 ENCOUNTER — Encounter (HOSPITAL_COMMUNITY): Payer: Self-pay | Admitting: Internal Medicine

## 2020-04-08 DIAGNOSIS — E785 Hyperlipidemia, unspecified: Secondary | ICD-10-CM

## 2020-04-08 DIAGNOSIS — D259 Leiomyoma of uterus, unspecified: Secondary | ICD-10-CM | POA: Diagnosis present

## 2020-04-08 DIAGNOSIS — E876 Hypokalemia: Secondary | ICD-10-CM

## 2020-04-08 DIAGNOSIS — S32492A Other specified fracture of left acetabulum, initial encounter for closed fracture: Secondary | ICD-10-CM

## 2020-04-08 DIAGNOSIS — Y92410 Unspecified street and highway as the place of occurrence of the external cause: Secondary | ICD-10-CM | POA: Diagnosis not present

## 2020-04-08 DIAGNOSIS — Z820 Family history of epilepsy and other diseases of the nervous system: Secondary | ICD-10-CM | POA: Diagnosis not present

## 2020-04-08 DIAGNOSIS — E7800 Pure hypercholesterolemia, unspecified: Secondary | ICD-10-CM | POA: Diagnosis present

## 2020-04-08 DIAGNOSIS — Z8249 Family history of ischemic heart disease and other diseases of the circulatory system: Secondary | ICD-10-CM | POA: Diagnosis not present

## 2020-04-08 DIAGNOSIS — E559 Vitamin D deficiency, unspecified: Secondary | ICD-10-CM | POA: Diagnosis present

## 2020-04-08 DIAGNOSIS — S32592A Other specified fracture of left pubis, initial encounter for closed fracture: Secondary | ICD-10-CM | POA: Diagnosis not present

## 2020-04-08 DIAGNOSIS — I10 Essential (primary) hypertension: Secondary | ICD-10-CM

## 2020-04-08 DIAGNOSIS — E669 Obesity, unspecified: Secondary | ICD-10-CM | POA: Diagnosis present

## 2020-04-08 DIAGNOSIS — F419 Anxiety disorder, unspecified: Secondary | ICD-10-CM | POA: Diagnosis present

## 2020-04-08 DIAGNOSIS — Z6841 Body Mass Index (BMI) 40.0 and over, adult: Secondary | ICD-10-CM | POA: Diagnosis not present

## 2020-04-08 DIAGNOSIS — G4733 Obstructive sleep apnea (adult) (pediatric): Secondary | ICD-10-CM | POA: Diagnosis present

## 2020-04-08 DIAGNOSIS — Z79899 Other long term (current) drug therapy: Secondary | ICD-10-CM | POA: Diagnosis not present

## 2020-04-08 DIAGNOSIS — Z780 Asymptomatic menopausal state: Secondary | ICD-10-CM | POA: Diagnosis not present

## 2020-04-08 DIAGNOSIS — Z833 Family history of diabetes mellitus: Secondary | ICD-10-CM | POA: Diagnosis not present

## 2020-04-08 DIAGNOSIS — Z20822 Contact with and (suspected) exposure to covid-19: Secondary | ICD-10-CM | POA: Diagnosis present

## 2020-04-08 DIAGNOSIS — T1490XA Injury, unspecified, initial encounter: Secondary | ICD-10-CM | POA: Diagnosis present

## 2020-04-08 DIAGNOSIS — S32402A Unspecified fracture of left acetabulum, initial encounter for closed fracture: Secondary | ICD-10-CM | POA: Diagnosis present

## 2020-04-08 LAB — CBC
HCT: 35.7 % — ABNORMAL LOW (ref 36.0–46.0)
Hemoglobin: 11.2 g/dL — ABNORMAL LOW (ref 12.0–15.0)
MCH: 29.7 pg (ref 26.0–34.0)
MCHC: 31.4 g/dL (ref 30.0–36.0)
MCV: 94.7 fL (ref 80.0–100.0)
Platelets: 184 10*3/uL (ref 150–400)
RBC: 3.77 MIL/uL — ABNORMAL LOW (ref 3.87–5.11)
RDW: 13.5 % (ref 11.5–15.5)
WBC: 5.7 10*3/uL (ref 4.0–10.5)
nRBC: 0 % (ref 0.0–0.2)

## 2020-04-08 LAB — BASIC METABOLIC PANEL
Anion gap: 10 (ref 5–15)
BUN: 10 mg/dL (ref 8–23)
CO2: 31 mmol/L (ref 22–32)
Calcium: 8.5 mg/dL — ABNORMAL LOW (ref 8.9–10.3)
Chloride: 99 mmol/L (ref 98–111)
Creatinine, Ser: 0.84 mg/dL (ref 0.44–1.00)
GFR calc Af Amer: >60 mL/min (ref >60)
GFR calc non Af Amer: >60 mL/min (ref >60)
Glucose, Bld: 118 mg/dL — ABNORMAL HIGH (ref 70–99)
Potassium: 2.8 mmol/L — ABNORMAL LOW (ref 3.5–5.1)
Sodium: 140 mmol/L (ref 135–145)

## 2020-04-08 MED ORDER — MUSCLE RUB 10-15 % EX CREA
TOPICAL_CREAM | CUTANEOUS | Status: DC | PRN
  Filled 2020-04-08: qty 85

## 2020-04-08 MED ORDER — POTASSIUM CHLORIDE 20 MEQ PO PACK
40.0000 meq | PACK | Freq: Two times a day (BID) | ORAL | Status: DC
  Administered 2020-04-08 – 2020-04-09 (×4): 40 meq via ORAL
  Filled 2020-04-08 (×5): qty 2

## 2020-04-08 NOTE — Plan of Care (Signed)

## 2020-04-08 NOTE — Progress Notes (Signed)
Inpatient Rehab Admissions Coordinator Note:   Per PT recommendation, pt was screened for CIR candidacy by Gayland Curry, MS, CCC-SLP.  At this time we are recommending an Inpatient Rehab consult.  AC will contact attending to request a consult order.  Please contact me with questions.    Gayland Curry, [place], [place] Admissions Coordinator [phone] 04/08/20 9:20 AM
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
## 2020-04-08 NOTE — Plan of Care (Signed)
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
## 2020-04-09 ENCOUNTER — Encounter (HOSPITAL_COMMUNITY): Payer: Self-pay | Admitting: Internal Medicine

## 2020-04-09 LAB — BASIC METABOLIC PANEL
Anion gap: 7 (ref 5–15)
BUN: 10 mg/dL (ref 8–23)
CO2: 34 mmol/L — ABNORMAL HIGH (ref 22–32)
Calcium: 9 mg/dL (ref 8.9–10.3)
Chloride: 99 mmol/L (ref 98–111)
Creatinine, Ser: 1.07 mg/dL — ABNORMAL HIGH (ref 0.44–1.00)
GFR calc Af Amer: 60 mL/min — ABNORMAL LOW (ref >60)
GFR calc non Af Amer: 51 mL/min — ABNORMAL LOW (ref >60)
Glucose, Bld: 115 mg/dL — ABNORMAL HIGH (ref 70–99)
Potassium: 3.7 mmol/L (ref 3.5–5.1)
Sodium: 140 mmol/L (ref 135–145)

## 2020-04-09 LAB — CBC
HCT: 33.9 % — ABNORMAL LOW (ref 36.0–46.0)
Hemoglobin: 11 g/dL — ABNORMAL LOW (ref 12.0–15.0)
MCH: 30.4 pg (ref 26.0–34.0)
MCHC: 32.4 g/dL (ref 30.0–36.0)
MCV: 93.6 fL (ref 80.0–100.0)
Platelets: 190 10*3/uL (ref 150–400)
RBC: 3.62 MIL/uL — ABNORMAL LOW (ref 3.87–5.11)
RDW: 13.4 % (ref 11.5–15.5)
WBC: 6.6 10*3/uL (ref 4.0–10.5)
nRBC: 0 % (ref 0.0–0.2)

## 2020-04-09 MED ORDER — ACETAMINOPHEN 500 MG PO TABS
1000.0000 mg | ORAL_TABLET | Freq: Three times a day (TID) | ORAL | Status: AC
  Administered 2020-04-09 – 2020-04-10 (×6): 1000 mg via ORAL
  Filled 2020-04-09 (×7): qty 2

## 2020-04-09 MED ORDER — AMLODIPINE BESYLATE 2.5 MG PO TABS
2.5000 mg | ORAL_TABLET | Freq: Every day | ORAL | Status: DC
  Administered 2020-04-09 – 2020-04-11 (×3): 2.5 mg via ORAL
  Filled 2020-04-09 (×3): qty 1

## 2020-04-09 MED ORDER — OXYCODONE HCL 5 MG PO TABS
10.0000 mg | ORAL_TABLET | ORAL | Status: DC | PRN
  Administered 2020-04-11: 10 mg via ORAL
  Filled 2020-04-09: qty 2

## 2020-04-09 NOTE — Progress Notes (Signed)
Inpatient Rehab Admissions:  Inpatient Rehab Consult received.  I met with patient at the bedside for rehabilitation assessment and to discuss goals and expectations of an inpatient rehab admission.  We discussed that pt may lack the medical necessity to warrant a CIR admission but pt would like to pursue with requesting prior auth from insurance company.  Note that she has made excellent progress with therapy over the last few days, and would likely meet mod I level goals in a shorter time frame in CIR than at SNF.  I will open up case with insurance today.   Signed: Shann Medal, PT, DPT Admissions Coordinator [phone] 04/09/20  3:51 PM

## 2020-04-09 NOTE — Plan of Care (Signed)

## 2020-04-09 NOTE — Plan of Care (Signed)
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
## 2020-04-10 LAB — CBC
HCT: 33.8 % — ABNORMAL LOW (ref 36.0–46.0)
Hemoglobin: 10.7 g/dL — ABNORMAL LOW (ref 12.0–15.0)
MCH: 29.7 pg (ref 26.0–34.0)
MCHC: 31.7 g/dL (ref 30.0–36.0)
MCV: 93.9 fL (ref 80.0–100.0)
Platelets: 198 10*3/uL (ref 150–400)
RBC: 3.6 MIL/uL — ABNORMAL LOW (ref 3.87–5.11)
RDW: 13.6 % (ref 11.5–15.5)
WBC: 4.9 10*3/uL (ref 4.0–10.5)
nRBC: 0 % (ref 0.0–0.2)

## 2020-04-10 LAB — BASIC METABOLIC PANEL
Anion gap: 9 (ref 5–15)
BUN: 10 mg/dL (ref 8–23)
CO2: 32 mmol/L (ref 22–32)
Calcium: 8.9 mg/dL (ref 8.9–10.3)
Chloride: 99 mmol/L (ref 98–111)
Creatinine, Ser: 0.88 mg/dL (ref 0.44–1.00)
GFR calc Af Amer: >60 mL/min (ref >60)
GFR calc non Af Amer: >60 mL/min (ref >60)
Glucose, Bld: 86 mg/dL (ref 70–99)
Potassium: 4.2 mmol/L (ref 3.5–5.1)
Sodium: 140 mmol/L (ref 135–145)

## 2020-04-10 NOTE — Plan of Care (Signed)
    Problem: Education: Goal: Knowledge of General Education information will improve Description: Including pain rating scale, medication(s)/side effects and non-pharmacologic comfort measures Outcome: Adequate for Discharge   Problem: Health Behavior/Discharge Planning: Goal: Ability to manage health-related needs will improve Outcome: Adequate for Discharge   Problem: Activity: Goal: Risk for activity intolerance will decrease Outcome: Adequate for Discharge   
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
## 2020-04-10 NOTE — Progress Notes (Signed)
Inpatient Rehab Admissions Coordinator:   I have received a denial from pt's insurance for CIR authorization.  Pt will need to seek f/u therapy in an alternate level of care.  I will let TOC team and pt know today.  Will sign off.   Shann Medal, PT, DPT Admissions Coordinator [phone] 04/10/20  9:55 AM
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
## 2020-04-10 NOTE — TOC CAGE-AID Note (Signed)
Transition of Care [facility]) - CAGE-AID Screening   Patient Details  Name: Sarah [name] MRN: [id number] Date of Birth: 12-Nov-1945  Transition of Care [facility]) CM/SW Contact:    Emeterio Reeve, [place] Phone Number: 04/10/2020, 1:46 PM   Clinical Narrative:  CSW met with pt at bedside. CSW introduced self and explained her role at the hospital.  PT denies alcohol use and substance use. Pt did not need any resources at this time.    CAGE-AID Screening:    Have You Ever Felt You Ought to Cut Down on Your Drinking or Drug Use?: No Have People Annoyed You By Critizing Your Drinking Or Drug Use?: No Have You Felt Bad Or Guilty About Your Drinking Or Drug Use?: No Have You Ever Had a Drink or Used Drugs First Thing In The Morning to Steady Your Nerves or to Get Rid of a Hangover?: No CAGE-AID Score: 0  Substance Abuse Education Offered: Yes     Blima Ledger, [place] Social Worker [phone]
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
## 2020-04-10 NOTE — Plan of Care (Signed)
  Problem: Education: Goal: Knowledge of General Education information will improve Description Including pain rating scale, medication(s)/side effects and non-pharmacologic comfort measures Outcome: Progressing   Problem: Health Behavior/Discharge Planning: Goal: Ability to manage health-related needs will improve Outcome: Progressing   

## 2020-04-11 ENCOUNTER — Telehealth: Payer: Self-pay

## 2020-04-11 MED ORDER — MUSCLE RUB 10-15 % EX CREA: 1.0000 "application " | TOPICAL_CREAM | CUTANEOUS | 0 refills | Status: DC | PRN

## 2020-04-11 MED ORDER — ACETAMINOPHEN 325 MG PO TABS: 650.0000 mg | ORAL_TABLET | Freq: Four times a day (QID) | ORAL | 0 refills | Status: AC | PRN

## 2020-04-11 MED ORDER — POLYETHYLENE GLYCOL 3350 17 G PO PACK: 17.0000 g | PACK | Freq: Every day | ORAL | 0 refills | Status: AC | PRN

## 2020-04-11 MED ORDER — OXYCODONE HCL 5 MG PO TABS: 5.0000 mg | ORAL_TABLET | Freq: Three times a day (TID) | ORAL | 0 refills | Status: AC | PRN

## 2020-04-11 MED ORDER — ACETAMINOPHEN 325 MG PO TABS: 650.0000 mg | ORAL_TABLET | Freq: Four times a day (QID) | ORAL | 0 refills | Status: DC | PRN

## 2020-04-11 NOTE — Telephone Encounter (Signed)
Hospital TOC, discharge 04/11/2020 appt 04/13/2020 with Dr. Koleen Distance.
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
## 2020-04-11 NOTE — Progress Notes (Signed)
Discharge packet provided to the patient.  Instructions were reviewed with the patient.  The patient plans to be discharged to the home.

## 2020-04-11 NOTE — Plan of Care (Signed)
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
## 2020-04-12 NOTE — Telephone Encounter (Signed)
TC to patient to complete Transition Care Management follow-up phone call, VM obtained, VM box is full and RN unable to leave message. SChaplin, RN,BSN
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
## 2020-04-13 ENCOUNTER — Ambulatory Visit (INDEPENDENT_AMBULATORY_CARE_PROVIDER_SITE_OTHER): Payer: PPO | Admitting: Internal Medicine

## 2020-04-13 ENCOUNTER — Encounter: Payer: Self-pay | Admitting: Internal Medicine

## 2020-04-13 VITALS — BP 142/61 | HR 92 | Temp 98.9°F | Ht 66.0 in

## 2020-04-13 DIAGNOSIS — S3282XA Multiple fractures of pelvis without disruption of pelvic ring, initial encounter for closed fracture: Secondary | ICD-10-CM

## 2020-04-13 DIAGNOSIS — M7989 Other specified soft tissue disorders: Secondary | ICD-10-CM | POA: Diagnosis not present

## 2020-04-13 DIAGNOSIS — D259 Leiomyoma of uterus, unspecified: Secondary | ICD-10-CM | POA: Diagnosis not present

## 2020-04-13 DIAGNOSIS — S3282XD Multiple fractures of pelvis without disruption of pelvic ring, subsequent encounter for fracture with routine healing: Secondary | ICD-10-CM

## 2020-04-13 DIAGNOSIS — F32A Depression, unspecified: Secondary | ICD-10-CM | POA: Diagnosis not present
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
## 2020-04-16 ENCOUNTER — Other Ambulatory Visit: Payer: Self-pay | Admitting: *Deleted

## 2020-04-16 ENCOUNTER — Encounter: Payer: Self-pay | Admitting: Internal Medicine

## 2020-04-16 DIAGNOSIS — G8929 Other chronic pain: Secondary | ICD-10-CM

## 2020-04-16 DIAGNOSIS — E785 Hyperlipidemia, unspecified: Secondary | ICD-10-CM

## 2020-04-16 DIAGNOSIS — I10 Essential (primary) hypertension: Secondary | ICD-10-CM

## 2020-04-16 DIAGNOSIS — E559 Vitamin D deficiency, unspecified: Secondary | ICD-10-CM

## 2020-04-16 DIAGNOSIS — F322 Major depressive disorder, single episode, severe without psychotic features: Secondary | ICD-10-CM

## 2020-04-16 MED ORDER — ATORVASTATIN CALCIUM 10 MG PO TABS: 10.0000 mg | ORAL_TABLET | Freq: Every day | ORAL | 1 refills | Status: DC

## 2020-04-16 MED ORDER — VENLAFAXINE HCL ER 150 MG PO CP24: 300.0000 mg | ORAL_CAPSULE | Freq: Every day | ORAL | 1 refills | Status: DC

## 2020-04-16 MED ORDER — AMLODIPINE BESYLATE 2.5 MG PO TABS: 2.5000 mg | ORAL_TABLET | Freq: Every day | ORAL | 1 refills | Status: DC

## 2020-04-16 MED ORDER — GABAPENTIN 300 MG PO CAPS: ORAL_CAPSULE | ORAL | 2 refills | Status: DC

## 2020-04-16 MED ORDER — HYDROCHLOROTHIAZIDE 12.5 MG PO TABS: 12.5000 mg | ORAL_TABLET | Freq: Every day | ORAL | 1 refills | Status: DC

## 2020-04-16 MED ORDER — VITAMIN D 50 MCG (2000 UT) PO CAPS: 2000.0000 [IU] | ORAL_CAPSULE | Freq: Every day | ORAL | 0 refills | Status: DC
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
## 2020-04-16 NOTE — Progress Notes (Signed)
Internal Medicine Clinic Attending  Case discussed with Dr. Bloomfield  At the time of the visit.  We reviewed the resident's history and exam and pertinent patient test results.  I agree with the assessment, diagnosis, and plan of care documented in the resident's note.  
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
## 2020-04-17 ENCOUNTER — Telehealth: Payer: Self-pay | Admitting: Internal Medicine
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
## 2020-04-17 NOTE — Telephone Encounter (Signed)
Pt requesting a call back from a nurse.

## 2020-04-17 NOTE — Telephone Encounter (Signed)
It would need to be once she is completely off any narcotic pain medication. Thanks!

## 2020-04-17 NOTE — Telephone Encounter (Signed)
TC to patient, VM obtained, VM box was full and nurse unable to leave message. SChaplin, RN,BSN

## 2020-04-18 NOTE — Telephone Encounter (Signed)
Attempted to notify patient of Dr. Janne Napoleon recommendation, VM box was full and nurse unable to leave message. SChaplin, RN,BSN

## 2020-04-30 ENCOUNTER — Ambulatory Visit: Payer: PPO | Attending: Internal Medicine | Admitting: Physical Therapy

## 2020-04-30 ENCOUNTER — Other Ambulatory Visit: Payer: Self-pay

## 2020-04-30 DIAGNOSIS — R2689 Other abnormalities of gait and mobility: Secondary | ICD-10-CM | POA: Diagnosis not present

## 2020-04-30 DIAGNOSIS — R2681 Unsteadiness on feet: Secondary | ICD-10-CM | POA: Diagnosis not present

## 2020-04-30 DIAGNOSIS — M25552 Pain in left hip: Secondary | ICD-10-CM | POA: Insufficient documentation

## 2020-04-30 DIAGNOSIS — M6281 Muscle weakness (generalized): Secondary | ICD-10-CM | POA: Insufficient documentation
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
## 2020-04-30 NOTE — Patient Instructions (Signed)
Access Code: [email or web address] URL: [email or web address] Date: 04/30/2020 Prepared by: Mady Haagensen  Exercises Seated Ankle Pumps - 1-2 x daily - 7 x weekly - 1-2 sets - 10 reps Seated Heel Slide - 1-2 x daily - 7 x weekly - 1-2 sets - 10 reps

## 2020-05-03 ENCOUNTER — Ambulatory Visit: Payer: PPO | Admitting: Physical Therapy

## 2020-05-07 ENCOUNTER — Ambulatory Visit: Payer: PPO | Admitting: Physical Therapy

## 2020-05-07 ENCOUNTER — Encounter: Payer: Self-pay | Admitting: Physical Therapy

## 2020-05-07 ENCOUNTER — Other Ambulatory Visit: Payer: Self-pay

## 2020-05-07 DIAGNOSIS — R2689 Other abnormalities of gait and mobility: Secondary | ICD-10-CM | POA: Diagnosis not present

## 2020-05-07 DIAGNOSIS — M25552 Pain in left hip: Secondary | ICD-10-CM

## 2020-05-07 DIAGNOSIS — M6281 Muscle weakness (generalized): Secondary | ICD-10-CM

## 2020-05-07 DIAGNOSIS — R2681 Unsteadiness on feet: Secondary | ICD-10-CM
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
## 2020-05-07 NOTE — Patient Instructions (Signed)
Access Code: [email or web address] URL: [email or web address] Date: 05/07/2020 Prepared by: Nita Sells  Exercises Seated Ankle Pumps - 1-2 x daily - 7 x weekly - 1-2 sets - 10 reps Seated Heel Slide - 1-2 x daily - 7 x weekly - 1-2 sets - 10 reps Seated Long Arc Quad - 1-2 x daily - 7 x weekly - 1-2 sets - 10 reps Supine Heel Slide - 1-2 x daily - 7 x weekly - 1-2 sets - 10 reps

## 2020-05-11 ENCOUNTER — Encounter: Payer: Self-pay | Admitting: Physical Therapy

## 2020-05-11 ENCOUNTER — Other Ambulatory Visit: Payer: Self-pay

## 2020-05-11 ENCOUNTER — Ambulatory Visit: Payer: PPO | Admitting: Physical Therapy

## 2020-05-11 DIAGNOSIS — R2689 Other abnormalities of gait and mobility: Secondary | ICD-10-CM

## 2020-05-11 DIAGNOSIS — M6281 Muscle weakness (generalized): Secondary | ICD-10-CM

## 2020-05-11 DIAGNOSIS — R2681 Unsteadiness on feet: Secondary | ICD-10-CM

## 2020-05-11 DIAGNOSIS — M25552 Pain in left hip: Secondary | ICD-10-CM
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
## 2020-05-14 ENCOUNTER — Ambulatory Visit: Payer: PPO | Admitting: Physical Therapy

## 2020-05-15 ENCOUNTER — Ambulatory Visit: Payer: PPO | Admitting: Physical Therapy

## 2020-05-17 ENCOUNTER — Other Ambulatory Visit: Payer: Self-pay

## 2020-05-17 ENCOUNTER — Ambulatory Visit: Payer: PPO | Attending: Internal Medicine | Admitting: Physical Therapy

## 2020-05-17 DIAGNOSIS — R2681 Unsteadiness on feet: Secondary | ICD-10-CM | POA: Diagnosis not present

## 2020-05-17 DIAGNOSIS — R2689 Other abnormalities of gait and mobility: Secondary | ICD-10-CM

## 2020-05-17 DIAGNOSIS — M25552 Pain in left hip: Secondary | ICD-10-CM | POA: Diagnosis not present

## 2020-05-17 DIAGNOSIS — M6281 Muscle weakness (generalized): Secondary | ICD-10-CM | POA: Diagnosis not present
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
## 2020-05-22 ENCOUNTER — Ambulatory Visit: Payer: PPO | Admitting: Physical Therapy

## 2020-05-22 ENCOUNTER — Other Ambulatory Visit: Payer: Self-pay

## 2020-05-22 ENCOUNTER — Encounter: Payer: Self-pay | Admitting: Physical Therapy

## 2020-05-22 DIAGNOSIS — M6281 Muscle weakness (generalized): Secondary | ICD-10-CM

## 2020-05-22 DIAGNOSIS — M25552 Pain in left hip: Secondary | ICD-10-CM

## 2020-05-22 DIAGNOSIS — R2689 Other abnormalities of gait and mobility: Secondary | ICD-10-CM

## 2020-05-22 DIAGNOSIS — R2681 Unsteadiness on feet: Secondary | ICD-10-CM
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
## 2020-05-24 ENCOUNTER — Encounter: Payer: Self-pay | Admitting: Physical Therapy

## 2020-05-24 ENCOUNTER — Ambulatory Visit: Payer: PPO | Admitting: Physical Therapy

## 2020-05-24 ENCOUNTER — Other Ambulatory Visit: Payer: Self-pay

## 2020-05-24 DIAGNOSIS — M25552 Pain in left hip: Secondary | ICD-10-CM

## 2020-05-24 DIAGNOSIS — M6281 Muscle weakness (generalized): Secondary | ICD-10-CM

## 2020-05-24 DIAGNOSIS — R2689 Other abnormalities of gait and mobility: Secondary | ICD-10-CM | POA: Diagnosis not present

## 2020-05-24 DIAGNOSIS — R2681 Unsteadiness on feet: Secondary | ICD-10-CM
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
## 2020-05-29 ENCOUNTER — Ambulatory Visit: Payer: PPO | Admitting: Physical Therapy

## 2020-05-31 ENCOUNTER — Encounter: Payer: Self-pay | Admitting: Physical Therapy

## 2020-05-31 ENCOUNTER — Ambulatory Visit (INDEPENDENT_AMBULATORY_CARE_PROVIDER_SITE_OTHER): Payer: PPO | Admitting: Student

## 2020-05-31 ENCOUNTER — Ambulatory Visit: Payer: PPO | Admitting: Physical Therapy

## 2020-05-31 ENCOUNTER — Ambulatory Visit (HOSPITAL_COMMUNITY)
Admission: RE | Admit: 2020-05-31 | Discharge: 2020-05-31 | Disposition: A | Discharge status: Home / Self Care | Payer: PPO | Source: Ambulatory Visit | Attending: Internal Medicine | Admitting: Internal Medicine

## 2020-05-31 ENCOUNTER — Encounter: Payer: Self-pay | Admitting: Student

## 2020-05-31 ENCOUNTER — Other Ambulatory Visit: Payer: Self-pay

## 2020-05-31 VITALS — BP 129/58 | HR 85 | Temp 98.3°F | Ht 68.0 in | Wt 215.9 lb

## 2020-05-31 DIAGNOSIS — Z23 Encounter for immunization: Secondary | ICD-10-CM | POA: Diagnosis not present

## 2020-05-31 DIAGNOSIS — K8020 Calculus of gallbladder without cholecystitis without obstruction: Secondary | ICD-10-CM | POA: Diagnosis not present

## 2020-05-31 DIAGNOSIS — M7989 Other specified soft tissue disorders: Secondary | ICD-10-CM | POA: Insufficient documentation

## 2020-05-31 DIAGNOSIS — S3282XA Multiple fractures of pelvis without disruption of pelvic ring, initial encounter for closed fracture: Secondary | ICD-10-CM

## 2020-05-31 DIAGNOSIS — D259 Leiomyoma of uterus, unspecified: Secondary | ICD-10-CM | POA: Diagnosis not present

## 2020-05-31 DIAGNOSIS — M25552 Pain in left hip: Secondary | ICD-10-CM

## 2020-05-31 DIAGNOSIS — F322 Major depressive disorder, single episode, severe without psychotic features: Secondary | ICD-10-CM | POA: Diagnosis not present

## 2020-05-31 HISTORY — DX: Other specified soft tissue disorders: M79.89

## 2020-05-31 MED ORDER — BUPROPION HCL ER (XL) 150 MG PO TB24: 150.0000 mg | ORAL_TABLET | ORAL | 2 refills | Status: DC
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
## 2020-05-31 NOTE — Assessment & Plan Note (Signed)
Found incidentally on CT scan 04/07/2020

## 2020-05-31 NOTE — Assessment & Plan Note (Signed)
Patient states that breast-feeding is not worsening.  Notes that she lost her husband in August, and anticipates having a hard time during this upcoming holiday season.  Takes Effexor 300 mg daily it is not adequately improving her mood.  She notes decreased appetite, anhedonia, anxiety.  Denies SI, HI, hallucinations.  Has not yet seen a therapist.  -Referral to integrated behavioral health -Continue Effexor 300 mg daily -Start Wellbutrin 150 mg daily

## 2020-05-31 NOTE — Progress Notes (Signed)
Lower extremity venous LT study completed.  Preliminary results relayed to Dr. Bridgett Larsson.   See CV Proc for preliminary results report.   Darlin Coco, RDMS

## 2020-05-31 NOTE — Patient Instructions (Signed)
Thank you for allowing [place] to be a part of your care today, it was a pleasure seeing you. We discussed your leg swelling, fracture, and depression  I am checking a ultrasound of the left leg  I have made these changes to your medications: Start Wellbutrin 150 mg daily  Referred you to orthopedic surgery to see about the progress on your leg.  For depression, I will start you on a new medication called Wellbutrin in addition to your current Effexor.  Please take this daily.  It may improve your energy and may decrease your appetite.  I am also referring you to our behavioral health specialist for counseling, they should contact you in the near future.  Please follow up in roughly 3 months or sooner.  I have asked the front desk to schedule an appointment with Dr. Michaela Corner to follow-up on your ongoing medical issues.   Thank you, and please call the Internal Medicine Clinic at [phone] if you have any questions.  Best, Dr. Bridgett Larsson
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
## 2020-06-05 ENCOUNTER — Ambulatory Visit: Payer: PPO | Admitting: Physical Therapy

## 2020-06-05 NOTE — Progress Notes (Signed)
Internal Medicine Clinic Attending  I saw and evaluated the patient.  I personally confirmed the key portions of the history and exam documented by Dr. Chen and I reviewed pertinent patient test results.  The assessment, diagnosis, and plan were formulated together and I agree with the documentation in the resident's note.  

## 2020-06-13 ENCOUNTER — Encounter: Payer: Self-pay | Admitting: Physical Therapy

## 2020-06-13 ENCOUNTER — Other Ambulatory Visit: Payer: Self-pay

## 2020-06-13 ENCOUNTER — Ambulatory Visit: Payer: PPO | Attending: Internal Medicine | Admitting: Physical Therapy

## 2020-06-13 DIAGNOSIS — M25552 Pain in left hip: Secondary | ICD-10-CM | POA: Diagnosis not present

## 2020-06-13 DIAGNOSIS — R2681 Unsteadiness on feet: Secondary | ICD-10-CM | POA: Diagnosis not present

## 2020-06-13 DIAGNOSIS — R2689 Other abnormalities of gait and mobility: Secondary | ICD-10-CM | POA: Insufficient documentation

## 2020-06-13 DIAGNOSIS — M6281 Muscle weakness (generalized): Secondary | ICD-10-CM | POA: Insufficient documentation
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
## 2020-06-15 ENCOUNTER — Other Ambulatory Visit: Payer: Self-pay

## 2020-06-15 ENCOUNTER — Encounter: Payer: Self-pay | Admitting: Physical Therapy

## 2020-06-15 ENCOUNTER — Ambulatory Visit: Payer: PPO | Admitting: Physical Therapy

## 2020-06-15 DIAGNOSIS — R2681 Unsteadiness on feet: Secondary | ICD-10-CM

## 2020-06-15 DIAGNOSIS — R2689 Other abnormalities of gait and mobility: Secondary | ICD-10-CM

## 2020-06-15 DIAGNOSIS — M25552 Pain in left hip: Secondary | ICD-10-CM

## 2020-06-15 DIAGNOSIS — M6281 Muscle weakness (generalized): Secondary | ICD-10-CM
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
## 2020-06-20 ENCOUNTER — Institutional Professional Consult (permissible substitution): Payer: PPO | Admitting: Behavioral Health

## 2020-06-20 ENCOUNTER — Ambulatory Visit: Payer: PPO | Admitting: Physical Therapy

## 2020-06-20 DIAGNOSIS — S32402A Unspecified fracture of left acetabulum, initial encounter for closed fracture: Secondary | ICD-10-CM | POA: Diagnosis not present

## 2020-06-20 DIAGNOSIS — M1712 Unilateral primary osteoarthritis, left knee: Secondary | ICD-10-CM | POA: Diagnosis not present

## 2020-06-22 ENCOUNTER — Ambulatory Visit: Payer: PPO | Admitting: Physical Therapy

## 2020-06-22 ENCOUNTER — Telehealth: Payer: Self-pay | Admitting: Physical Therapy

## 2020-06-22 NOTE — Telephone Encounter (Signed)
Called and spoke with patient about missed (no-show) appointment for PT at 11 am. She reports being unaware of appointment.  Reminded pt of next appointment on 06/26/2020.  Mady Haagensen, PT 06/22/20 11:37 AM Phone: [phone] Fax: [phone]

## 2020-06-26 ENCOUNTER — Ambulatory Visit: Payer: PPO | Admitting: Physical Therapy

## 2020-06-26 ENCOUNTER — Other Ambulatory Visit: Payer: Self-pay

## 2020-06-26 DIAGNOSIS — R2681 Unsteadiness on feet: Secondary | ICD-10-CM

## 2020-06-26 DIAGNOSIS — M25552 Pain in left hip: Secondary | ICD-10-CM | POA: Diagnosis not present

## 2020-06-26 DIAGNOSIS — R2689 Other abnormalities of gait and mobility: Secondary | ICD-10-CM

## 2020-06-26 DIAGNOSIS — M6281 Muscle weakness (generalized): Secondary | ICD-10-CM
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
## 2020-06-28 ENCOUNTER — Ambulatory Visit: Payer: PPO | Admitting: Physical Therapy

## 2020-06-28 ENCOUNTER — Encounter: Payer: Self-pay | Admitting: Physical Therapy

## 2020-06-28 ENCOUNTER — Other Ambulatory Visit: Payer: Self-pay

## 2020-06-28 DIAGNOSIS — R2681 Unsteadiness on feet: Secondary | ICD-10-CM

## 2020-06-28 DIAGNOSIS — M6281 Muscle weakness (generalized): Secondary | ICD-10-CM

## 2020-06-28 DIAGNOSIS — M25552 Pain in left hip: Secondary | ICD-10-CM

## 2020-06-28 DIAGNOSIS — R2689 Other abnormalities of gait and mobility: Secondary | ICD-10-CM
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
## 2020-06-28 NOTE — Patient Instructions (Signed)
Walk for exercise:  Start walking the hallway in your home, 3-4 laps, 3 times per day.    -Use the walker  -Stand tall and make sure you relax through your shoulders  -Make sure you are trying to walk a normal walking pattern-stepping through without stopping   After 3-5 days, you want to add in another 1-2 laps every few days.

## 2020-07-02 ENCOUNTER — Ambulatory Visit: Payer: PPO | Admitting: Physical Therapy

## 2020-07-02 ENCOUNTER — Encounter: Payer: Self-pay | Admitting: Physical Therapy

## 2020-07-02 ENCOUNTER — Other Ambulatory Visit: Payer: Self-pay

## 2020-07-02 DIAGNOSIS — R2689 Other abnormalities of gait and mobility: Secondary | ICD-10-CM

## 2020-07-02 DIAGNOSIS — R2681 Unsteadiness on feet: Secondary | ICD-10-CM

## 2020-07-02 DIAGNOSIS — M6281 Muscle weakness (generalized): Secondary | ICD-10-CM

## 2020-07-02 DIAGNOSIS — M25552 Pain in left hip: Secondary | ICD-10-CM | POA: Diagnosis not present
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
## 2020-07-02 NOTE — Patient Instructions (Addendum)
It is important to avoid accidents which may result in broken bones.  Here are a few ideas on how to make your home safer so you will be less likely to trip or fall.  1. Use nonskid mats or non slip strips in your shower or tub, on your bathroom floor and around sinks.  If you know that you have spilled water, wipe it up! 2. In the bathroom, it is important to have properly installed grab bars on the walls or on the edge of the tub.  Towel racks are NOT strong enough for you to hold onto or to pull on for support. 3. Stairs and hallways should have enough light.  Add lamps or night lights if you need ore light. 4. It is good to have handrails on both sides of the stairs if possible.  Always fix broken handrails right away. 5. It is important to see the edges of steps.  Paint the edges of outdoor steps white so you can see them better.  Put colored tape on the edge of inside steps. 6. Throw-rugs are dangerous because they can slide.  Removing the rugs is the best idea, but if they must stay, add adhesive carpet tape to prevent slipping. 7. Do not keep things on stairs or in the halls.  Remove small furniture that blocks the halls as it may cause you to trip.  Keep telephone and electrical cords out of the way where you walk. 8. Always were sturdy, rubber-soled shoes for good support.  Never wear just socks, especially on the stairs.  Socks may cause you to slip or fall.  Do not wear full-length housecoats as you can easily trip on the bottom.  9. Place the things you use the most on the shelves that are the easiest to reach.  If you use a stepstool, make sure it is in good condition.  If you feel unsteady, DO NOT climb, ask for help. 10. If a health professional advises you to use a cane or walker, do not be ashamed.  These items can keep you from falling and breaking your bones.  Provided handout for standing exercises:  Hip abduction x10, hip extension x 10, marching x 10 with support at counter,  alternating legs, 1-2 times per day.

## 2020-07-03 ENCOUNTER — Ambulatory Visit: Payer: PPO | Admitting: Behavioral Health

## 2020-07-03 DIAGNOSIS — F322 Major depressive disorder, single episode, severe without psychotic features: Secondary | ICD-10-CM

## 2020-07-03 NOTE — BH Specialist Note (Signed)
Integrated Behavioral Health Initial In-Person Visit  MRN: [id number] Name: Sarah [name]  Number of [facility] Clinician visits:: 1/6 Session Start time: 2:00pm  Session End time: 2:50pm Total time: 50  minutes  Types of Service: Individual psychotherapy  Interpretor:No. Interpretor Name and Language: n/a   Warm Hand Off Completed.       Subjective: Sarah [name] is a 74 y.o. female accompanied by self Patient was referred by [facility] for anx/dep. Patient reports the following symptoms/concerns: Pt sts she is down today due to the family situation. Her Husb died in March 16, 2023 & she has felt alone & lonely since. 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 She also lost her 31yo Son in 2015 & still does not know fully what happened to him. She lives alone & has not had hot water in 6 mos. Her two Dtrs that live on the [facility] call daily-she is grateful to them. She does not see her other Son as his Wife causes too much drama. Pt uses a walker to navigate to the front door of the hospital & then someone helps her reach [facility] in a chair. Duration of problem: Acutely the last 6 mos; Severity of problem: moderate  Objective: Mood: Angry and Depressed and Affect: A bit flat Risk of harm to self or others: No plan to harm self or others  Life Context: Family and Social: Pt is lonely since Husb's death in 16-Mar-2023. Her Church friends have unrealistic expectations. Pt would like some company. Her Husb was inattentive to the relationship for years. The holidays are weighing on her heavily. School/Work: Pt worked at [employer] for 38 yrs prior to retiring. Self-Care: Pt does not currently do much for herself. Life Changes: Pt has exp'd the death of her Husb this year & it has brought up much grief & loneliness in the past few months.   Patient and/or Family's Strengths/Protective Factors: Social and Emotional competence, Concrete supports in place (healthy  food, safe environments, etc.) and Pt needs her hot water heater installed  Goals Addressed: Patient will: 1. Reduce symptoms of: anxiety and grief 2. Increase knowledge and/or ability of: coping skills and stress reduction  3. Demonstrate ability to: Increase healthy adjustment to current life circumstances and assist Pt in healing her broken heart  Progress towards Goals: Established today; Pt wants opportunity to talk to someone regularly through the holidays.  Interventions: Interventions utilized: Intake/Assessment process  Standardized Assessments completed: Administer PHQ-9 & GAD-7 next visit.  Patient and/or Family Response: Pt is relieved to be able to speak w/someone about her feelings since her Husb died this year. She has been dealing w/going to [facility] for a broken leg bone.  Patient Centered Plan: Patient is on the following Treatment Plan(s):  Pt is taking Wellbutrin 150mg  & Effexor-XR 150mg . Provided some psychoedu re: these meds. Pt will try to record her feelings & thoughts on paper for next visit.  Assessment: Patient currently experiencing sad feelings & disappointment in how her family is treating her since death of Husb.   Patient may benefit from grief work & loss of sig people in her life.  Plan: 1. Follow up with behavioral health clinician on : 2 wks 2. Behavioral recommendations: Write your feelings out to help you process & understand your feelings better. Ask Church friends to assist you w/the hot water heater install. 3. Referral(s): [facility] (In Clinic) 4. "From scale of 1-10, how  likely are you to follow plan?": Collins, LMFT

## 2020-07-04 ENCOUNTER — Ambulatory Visit: Payer: PPO | Admitting: Physical Therapy

## 2020-07-04 ENCOUNTER — Other Ambulatory Visit: Payer: Self-pay

## 2020-07-04 ENCOUNTER — Encounter: Payer: Self-pay | Admitting: Physical Therapy

## 2020-07-04 DIAGNOSIS — R2681 Unsteadiness on feet: Secondary | ICD-10-CM

## 2020-07-04 DIAGNOSIS — M25552 Pain in left hip: Secondary | ICD-10-CM

## 2020-07-04 DIAGNOSIS — R2689 Other abnormalities of gait and mobility: Secondary | ICD-10-CM

## 2020-07-04 DIAGNOSIS — M6281 Muscle weakness (generalized): Secondary | ICD-10-CM
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
## 2020-07-10 ENCOUNTER — Other Ambulatory Visit: Payer: Self-pay

## 2020-07-10 ENCOUNTER — Encounter: Payer: Self-pay | Admitting: Physical Therapy

## 2020-07-10 ENCOUNTER — Ambulatory Visit: Payer: PPO | Admitting: Physical Therapy

## 2020-07-10 DIAGNOSIS — M6281 Muscle weakness (generalized): Secondary | ICD-10-CM

## 2020-07-10 DIAGNOSIS — R2681 Unsteadiness on feet: Secondary | ICD-10-CM

## 2020-07-10 DIAGNOSIS — M25552 Pain in left hip: Secondary | ICD-10-CM | POA: Diagnosis not present

## 2020-07-10 DIAGNOSIS — R2689 Other abnormalities of gait and mobility: Secondary | ICD-10-CM
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
## 2020-07-12 ENCOUNTER — Encounter: Payer: Self-pay | Admitting: Physical Therapy

## 2020-07-12 ENCOUNTER — Ambulatory Visit: Payer: PPO | Admitting: Physical Therapy

## 2020-07-12 ENCOUNTER — Other Ambulatory Visit: Payer: Self-pay

## 2020-07-12 DIAGNOSIS — M6281 Muscle weakness (generalized): Secondary | ICD-10-CM

## 2020-07-12 DIAGNOSIS — M25552 Pain in left hip: Secondary | ICD-10-CM

## 2020-07-12 DIAGNOSIS — R2681 Unsteadiness on feet: Secondary | ICD-10-CM

## 2020-07-12 DIAGNOSIS — R2689 Other abnormalities of gait and mobility: Secondary | ICD-10-CM
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
## 2020-07-17 ENCOUNTER — Other Ambulatory Visit: Payer: Self-pay

## 2020-07-17 ENCOUNTER — Ambulatory Visit: Payer: PPO | Admitting: Behavioral Health

## 2020-07-17 ENCOUNTER — Ambulatory Visit (INDEPENDENT_AMBULATORY_CARE_PROVIDER_SITE_OTHER): Payer: PPO | Admitting: Internal Medicine

## 2020-07-17 DIAGNOSIS — I10 Essential (primary) hypertension: Secondary | ICD-10-CM

## 2020-07-17 DIAGNOSIS — E785 Hyperlipidemia, unspecified: Secondary | ICD-10-CM | POA: Diagnosis not present

## 2020-07-17 DIAGNOSIS — F322 Major depressive disorder, single episode, severe without psychotic features: Secondary | ICD-10-CM

## 2020-07-17 DIAGNOSIS — G4733 Obstructive sleep apnea (adult) (pediatric): Secondary | ICD-10-CM

## 2020-07-17 DIAGNOSIS — E559 Vitamin D deficiency, unspecified: Secondary | ICD-10-CM

## 2020-07-17 DIAGNOSIS — R5382 Chronic fatigue, unspecified: Secondary | ICD-10-CM

## 2020-07-17 DIAGNOSIS — Z0000 Encounter for general adult medical examination without abnormal findings: Secondary | ICD-10-CM

## 2020-07-17 DIAGNOSIS — S3282XA Multiple fractures of pelvis without disruption of pelvic ring, initial encounter for closed fracture: Secondary | ICD-10-CM

## 2020-07-17 MED ORDER — AMLODIPINE BESYLATE 2.5 MG PO TABS: 2.5000 mg | ORAL_TABLET | Freq: Every day | ORAL | 1 refills | Status: DC

## 2020-07-17 MED ORDER — VITAMIN D 50 MCG (2000 UT) PO CAPS: 2000.0000 [IU] | ORAL_CAPSULE | Freq: Every day | ORAL | 0 refills | Status: DC

## 2020-07-17 MED ORDER — VENLAFAXINE HCL ER 150 MG PO CP24: 300.0000 mg | ORAL_CAPSULE | Freq: Every day | ORAL | 1 refills | Status: DC

## 2020-07-17 MED ORDER — HYDROCHLOROTHIAZIDE 12.5 MG PO TABS: 12.5000 mg | ORAL_TABLET | Freq: Every day | ORAL | 1 refills | Status: DC

## 2020-07-17 MED ORDER — BUPROPION HCL ER (XL) 150 MG PO TB24: 150.0000 mg | ORAL_TABLET | ORAL | 3 refills | Status: DC

## 2020-07-17 MED ORDER — ATORVASTATIN CALCIUM 10 MG PO TABS: 10.0000 mg | ORAL_TABLET | Freq: Every day | ORAL | 1 refills | Status: DC
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
## 2020-07-17 NOTE — Assessment & Plan Note (Signed)
-  Patient does complain of chronic fatigue and states that she has difficulty doing much activity during the day secondary to the fact that she feels tired all the time -I suspect that this is likely secondary to her underlying depression and a possible component of sleep apnea -We will follow up with this at her next visit -No further work-up for now

## 2020-07-17 NOTE — Assessment & Plan Note (Signed)
-  Patient does state that her husband passed away in 03-02-2023 and that she has been having worsening depression since then -She has followed up with Dr. Sallyanne Kuster and enjoys talking to her and would like to continue this -She does have an appointment for 2 PM today but is unable to make today's appointment.  We will asked the front desk to reschedule this for her -We will continue with Effexor and Wellbutrin for now -No further work-up at this time

## 2020-07-17 NOTE — Assessment & Plan Note (Signed)
-  Patient does complain of chronic fatigue and decreased energy during the day -There was concern that she had possible sleep apnea and was referred for sleep study but she never followed up on this secondary to the Covid pandemic -She continues to complain of chronic fatigue especially during the day and does not have the energy to do much.  I suspect a lot of this is secondary to her depression but some of this may be a component of sleep apnea -I discussed with the patient importance of following up for a sleep study and possible need for CPAP at night -She expressed understanding and states that she is going to discuss this more at our next visit

## 2020-07-17 NOTE — Assessment & Plan Note (Signed)
-  This problem is chronic and stable -We will recheck vitamin D level at her next visit -Patient states that she ran out of her vitamin D supplements at home and I have refilled this for her -No further work-up at this time

## 2020-07-17 NOTE — Assessment & Plan Note (Signed)
-  We discussed the importance of getting her Covid vaccine booster and patient states that she is interested in doing this -She does not know where to go to get this.  I explained to her that her local [facility] will be able to give her a Covid vaccine booster but she wanted to check with me before she did this -She states that she will call [facility] to try to get the Covid vaccine booster

## 2020-07-17 NOTE — Assessment & Plan Note (Signed)
-  This problem is chronic and stable -Patient states that she does not have a blood pressure cuff at home but has been compliant with her medications -She needs a refill of her hydrochlorothiazide and amlodipine and I have called this into her pharmacy -We will continue current medications for now and have patient follow-up for an in person appointment to check her blood pressure -No further work-up at this time
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
## 2020-07-17 NOTE — Assessment & Plan Note (Signed)
-  This problem is chronic and stable -Patient needs a refill of her Lipitor.  I have refilled this for her today -We will need to recheck lipid panel at her next visit -No further work-up at this time

## 2020-07-18 ENCOUNTER — Ambulatory Visit: Payer: Medicare Other | Admitting: Physical Therapy

## 2020-07-18 DIAGNOSIS — M1712 Unilateral primary osteoarthritis, left knee: Secondary | ICD-10-CM | POA: Diagnosis not present

## 2020-07-20 ENCOUNTER — Encounter: Payer: Self-pay | Admitting: Physical Therapy

## 2020-07-20 ENCOUNTER — Other Ambulatory Visit: Payer: Self-pay

## 2020-07-20 ENCOUNTER — Ambulatory Visit: Payer: Medicare Other | Attending: Internal Medicine | Admitting: Physical Therapy

## 2020-07-20 DIAGNOSIS — M25552 Pain in left hip: Secondary | ICD-10-CM | POA: Diagnosis not present

## 2020-07-20 DIAGNOSIS — R2689 Other abnormalities of gait and mobility: Secondary | ICD-10-CM | POA: Insufficient documentation

## 2020-07-20 DIAGNOSIS — R2681 Unsteadiness on feet: Secondary | ICD-10-CM | POA: Diagnosis not present

## 2020-07-20 DIAGNOSIS — M6281 Muscle weakness (generalized): Secondary | ICD-10-CM | POA: Insufficient documentation
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
## 2020-07-24 ENCOUNTER — Other Ambulatory Visit: Payer: Self-pay

## 2020-07-24 ENCOUNTER — Encounter: Payer: Self-pay | Admitting: Physical Therapy

## 2020-07-24 ENCOUNTER — Ambulatory Visit: Payer: Medicare Other | Admitting: Physical Therapy

## 2020-07-24 DIAGNOSIS — R2681 Unsteadiness on feet: Secondary | ICD-10-CM

## 2020-07-24 DIAGNOSIS — M6281 Muscle weakness (generalized): Secondary | ICD-10-CM | POA: Diagnosis not present

## 2020-07-24 DIAGNOSIS — R2689 Other abnormalities of gait and mobility: Secondary | ICD-10-CM | POA: Diagnosis not present

## 2020-07-24 DIAGNOSIS — M25552 Pain in left hip: Secondary | ICD-10-CM | POA: Diagnosis not present

## 2020-07-24 NOTE — Patient Instructions (Addendum)
Access Code: [email or web address] URL: [email or web address] Date: 07/24/2020 Prepared by: Mady Haagensen  Exercises Seated Ankle Pumps - 1-2 x daily - 7 x weekly - 1-2 sets - 10 reps Seated Heel Slide - 1-2 x daily - 7 x weekly - 1-2 sets - 10 reps Seated Long Arc Quad - 1-2 x daily - 7 x weekly - 1-2 sets - 10 reps Supine Heel Slide - 1-2 x daily - 7 x weekly - 1-2 sets - 10 reps   Standing Marching - 1 x daily - 5 x weekly - 1-2 sets - 10 reps Heel Toe Raises with Counter Support - 1 x daily - 5 x weekly - 1-2 sets - 10 reps Side to side weightshift - 1 x daily - 5 x weekly - 1-2 sets - 10 reps
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
## 2020-07-26 ENCOUNTER — Ambulatory Visit: Payer: PPO | Admitting: Behavioral Health

## 2020-07-26 ENCOUNTER — Ambulatory Visit: Payer: Medicare Other | Admitting: Physical Therapy

## 2020-07-26 ENCOUNTER — Encounter: Payer: Self-pay | Admitting: Physical Therapy

## 2020-07-26 ENCOUNTER — Other Ambulatory Visit: Payer: Self-pay

## 2020-07-26 DIAGNOSIS — M6281 Muscle weakness (generalized): Secondary | ICD-10-CM | POA: Diagnosis not present

## 2020-07-26 DIAGNOSIS — R2689 Other abnormalities of gait and mobility: Secondary | ICD-10-CM

## 2020-07-26 DIAGNOSIS — R2681 Unsteadiness on feet: Secondary | ICD-10-CM

## 2020-07-26 DIAGNOSIS — M25552 Pain in left hip: Secondary | ICD-10-CM | POA: Diagnosis not present
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
## 2020-08-03 ENCOUNTER — Telehealth: Payer: Self-pay | Admitting: Physical Therapy

## 2020-08-03 ENCOUNTER — Ambulatory Visit: Payer: Medicare Other | Admitting: Physical Therapy

## 2020-08-03 ENCOUNTER — Other Ambulatory Visit: Payer: Self-pay

## 2020-08-03 NOTE — Telephone Encounter (Signed)
Left message regarding today's missed 10:15 appointment.  Provided alternate later times for today, if patient could come in.  Reminded pt of her next scheduled appointment next week.  Mady Haagensen, PT 08/03/20 10:36 AM Phone: [phone] Fax: [phone]

## 2020-08-07 ENCOUNTER — Ambulatory Visit: Payer: Medicare Other | Admitting: Physical Therapy

## 2020-08-07 ENCOUNTER — Encounter: Payer: Self-pay | Admitting: Physical Therapy

## 2020-08-07 ENCOUNTER — Other Ambulatory Visit: Payer: Self-pay

## 2020-08-07 DIAGNOSIS — R2681 Unsteadiness on feet: Secondary | ICD-10-CM | POA: Diagnosis not present

## 2020-08-07 DIAGNOSIS — M6281 Muscle weakness (generalized): Secondary | ICD-10-CM

## 2020-08-07 DIAGNOSIS — M25552 Pain in left hip: Secondary | ICD-10-CM

## 2020-08-07 DIAGNOSIS — R2689 Other abnormalities of gait and mobility: Secondary | ICD-10-CM
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
## 2020-08-09 ENCOUNTER — Ambulatory Visit: Payer: Medicare Other | Admitting: Physical Therapy

## 2020-08-10 ENCOUNTER — Ambulatory Visit: Payer: Medicare Other | Admitting: Physical Therapy

## 2020-08-13 ENCOUNTER — Encounter: Payer: Self-pay | Admitting: Physical Therapy

## 2020-08-13 ENCOUNTER — Ambulatory Visit: Payer: Medicare Other | Admitting: Physical Therapy

## 2020-08-13 ENCOUNTER — Other Ambulatory Visit: Payer: Self-pay

## 2020-08-13 DIAGNOSIS — M25552 Pain in left hip: Secondary | ICD-10-CM | POA: Diagnosis not present

## 2020-08-13 DIAGNOSIS — M6281 Muscle weakness (generalized): Secondary | ICD-10-CM

## 2020-08-13 DIAGNOSIS — R2689 Other abnormalities of gait and mobility: Secondary | ICD-10-CM | POA: Diagnosis not present

## 2020-08-13 DIAGNOSIS — R2681 Unsteadiness on feet: Secondary | ICD-10-CM | POA: Diagnosis not present
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
## 2020-08-15 ENCOUNTER — Ambulatory Visit: Payer: Medicare Other | Attending: Internal Medicine | Admitting: Physical Therapy

## 2020-08-15 ENCOUNTER — Encounter: Payer: Self-pay | Admitting: Physical Therapy

## 2020-08-15 ENCOUNTER — Other Ambulatory Visit: Payer: Self-pay

## 2020-08-15 DIAGNOSIS — M25552 Pain in left hip: Secondary | ICD-10-CM | POA: Insufficient documentation

## 2020-08-15 DIAGNOSIS — R2689 Other abnormalities of gait and mobility: Secondary | ICD-10-CM | POA: Diagnosis not present

## 2020-08-15 DIAGNOSIS — R2681 Unsteadiness on feet: Secondary | ICD-10-CM | POA: Insufficient documentation

## 2020-08-15 DIAGNOSIS — M6281 Muscle weakness (generalized): Secondary | ICD-10-CM | POA: Insufficient documentation
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
## 2020-08-21 ENCOUNTER — Encounter: Payer: Self-pay | Admitting: Physical Therapy

## 2020-08-21 ENCOUNTER — Ambulatory Visit: Payer: Medicare Other | Admitting: Physical Therapy

## 2020-08-21 ENCOUNTER — Other Ambulatory Visit: Payer: Self-pay

## 2020-08-21 DIAGNOSIS — R2681 Unsteadiness on feet: Secondary | ICD-10-CM

## 2020-08-21 DIAGNOSIS — M25552 Pain in left hip: Secondary | ICD-10-CM

## 2020-08-21 DIAGNOSIS — R2689 Other abnormalities of gait and mobility: Secondary | ICD-10-CM | POA: Diagnosis not present

## 2020-08-21 DIAGNOSIS — M6281 Muscle weakness (generalized): Secondary | ICD-10-CM | POA: Diagnosis not present
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
## 2020-08-24 ENCOUNTER — Encounter: Payer: Self-pay | Admitting: Physical Therapy

## 2020-08-24 ENCOUNTER — Ambulatory Visit: Payer: Medicare Other | Admitting: Physical Therapy

## 2020-08-24 ENCOUNTER — Other Ambulatory Visit: Payer: Self-pay

## 2020-08-24 DIAGNOSIS — M25552 Pain in left hip: Secondary | ICD-10-CM | POA: Diagnosis not present

## 2020-08-24 DIAGNOSIS — R2689 Other abnormalities of gait and mobility: Secondary | ICD-10-CM

## 2020-08-24 DIAGNOSIS — R2681 Unsteadiness on feet: Secondary | ICD-10-CM | POA: Diagnosis not present

## 2020-08-24 DIAGNOSIS — M6281 Muscle weakness (generalized): Secondary | ICD-10-CM
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
## 2020-08-28 ENCOUNTER — Ambulatory Visit: Payer: Medicare Other | Admitting: Physical Therapy

## 2020-08-29 DIAGNOSIS — M25512 Pain in left shoulder: Secondary | ICD-10-CM | POA: Diagnosis not present

## 2020-08-29 DIAGNOSIS — M1712 Unilateral primary osteoarthritis, left knee: Secondary | ICD-10-CM | POA: Diagnosis not present

## 2020-08-29 DIAGNOSIS — M25552 Pain in left hip: Secondary | ICD-10-CM | POA: Diagnosis not present

## 2020-08-30 ENCOUNTER — Other Ambulatory Visit: Payer: Self-pay | Admitting: Internal Medicine

## 2020-08-31 ENCOUNTER — Ambulatory Visit: Payer: Medicare Other | Admitting: Physical Therapy

## 2020-09-05 ENCOUNTER — Other Ambulatory Visit: Payer: Self-pay

## 2020-09-05 ENCOUNTER — Ambulatory Visit: Payer: Medicare Other

## 2020-09-05 DIAGNOSIS — M25552 Pain in left hip: Secondary | ICD-10-CM | POA: Diagnosis not present

## 2020-09-05 DIAGNOSIS — R2689 Other abnormalities of gait and mobility: Secondary | ICD-10-CM | POA: Diagnosis not present

## 2020-09-05 DIAGNOSIS — M6281 Muscle weakness (generalized): Secondary | ICD-10-CM

## 2020-09-05 DIAGNOSIS — R2681 Unsteadiness on feet: Secondary | ICD-10-CM
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
## 2020-09-07 ENCOUNTER — Other Ambulatory Visit: Payer: Self-pay

## 2020-09-07 ENCOUNTER — Ambulatory Visit: Payer: Medicare Other | Admitting: Physical Therapy

## 2020-09-07 ENCOUNTER — Encounter: Payer: Self-pay | Admitting: Physical Therapy

## 2020-09-07 DIAGNOSIS — R2689 Other abnormalities of gait and mobility: Secondary | ICD-10-CM | POA: Diagnosis not present

## 2020-09-07 DIAGNOSIS — R2681 Unsteadiness on feet: Secondary | ICD-10-CM

## 2020-09-07 DIAGNOSIS — M25552 Pain in left hip: Secondary | ICD-10-CM | POA: Diagnosis not present

## 2020-09-07 DIAGNOSIS — M6281 Muscle weakness (generalized): Secondary | ICD-10-CM | POA: Diagnosis not present
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
## 2020-09-07 NOTE — Patient Instructions (Addendum)
Access Code: [email or web address] URL: [email or web address] Date: 09/07/2020 Prepared by: Mady Haagensen  Exercises Seated Ankle Pumps - 1-2 x daily - 7 x weekly - 1-2 sets - 10 reps Seated Heel Slide - 1-2 x daily - 7 x weekly - 1-2 sets - 10 reps Seated Long Arc Quad - 1-2 x daily - 7 x weekly - 1-2 sets - 10 reps Supine Heel Slide - 1-2 x daily - 7 x weekly - 1-2 sets - 10 reps Standing Marching - 1 x daily - 5 x weekly - 1-2 sets - 10 reps Heel Toe Raises with Counter Support - 1 x daily - 5 x weekly - 1-2 sets - 10 reps Side to side weightshift - 1 x daily - 5 x weekly - 1-2 sets - 10 reps  Added 09/07/2020 Side Stepping with Counter Support - 1 x daily - 5 x weekly - 1 sets - 3-5 reps Backward Walking with Counter Support - 1 x daily - 5 x weekly - 1 sets - 3-5 reps

## 2020-09-11 ENCOUNTER — Ambulatory Visit: Payer: Medicare Other | Attending: Internal Medicine | Admitting: Physical Therapy

## 2020-09-11 ENCOUNTER — Encounter: Payer: Self-pay | Admitting: Physical Therapy

## 2020-09-11 ENCOUNTER — Other Ambulatory Visit: Payer: Self-pay

## 2020-09-11 DIAGNOSIS — R2681 Unsteadiness on feet: Secondary | ICD-10-CM | POA: Diagnosis not present

## 2020-09-11 DIAGNOSIS — M25552 Pain in left hip: Secondary | ICD-10-CM | POA: Insufficient documentation

## 2020-09-11 DIAGNOSIS — R2689 Other abnormalities of gait and mobility: Secondary | ICD-10-CM | POA: Insufficient documentation

## 2020-09-11 DIAGNOSIS — M6281 Muscle weakness (generalized): Secondary | ICD-10-CM | POA: Insufficient documentation
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
## 2020-09-12 ENCOUNTER — Other Ambulatory Visit: Payer: Self-pay | Admitting: Internal Medicine

## 2020-09-12 ENCOUNTER — Other Ambulatory Visit: Payer: Self-pay | Admitting: *Deleted

## 2020-09-12 DIAGNOSIS — E559 Vitamin D deficiency, unspecified: Secondary | ICD-10-CM

## 2020-09-12 DIAGNOSIS — F322 Major depressive disorder, single episode, severe without psychotic features: Secondary | ICD-10-CM

## 2020-09-12 NOTE — Telephone Encounter (Signed)
error 

## 2020-09-12 NOTE — Telephone Encounter (Signed)
Refill Request  Cholecalciferol (VITAMIN D) 50 MCG (2000 UT) CAPS  Pt is unsure of which depression medication she is out of  venlafaxine XR (EFFEXOR-XR) 150 MG 24 hr capsule  buPROPion (WELLBUTRIN XL) 150 MG 24 hr tablet

## 2020-09-13 MED ORDER — VITAMIN D 50 MCG (2000 UT) PO CAPS: 2000.0000 [IU] | ORAL_CAPSULE | Freq: Every day | ORAL | 0 refills | Status: DC

## 2020-09-13 MED ORDER — VENLAFAXINE HCL ER 150 MG PO CP24: 300.0000 mg | ORAL_CAPSULE | Freq: Every day | ORAL | 1 refills | Status: DC

## 2020-09-14 ENCOUNTER — Ambulatory Visit: Payer: Medicare Other | Admitting: Physical Therapy

## 2020-10-05 DIAGNOSIS — S32592A Other specified fracture of left pubis, initial encounter for closed fracture: Secondary | ICD-10-CM | POA: Diagnosis not present

## 2020-10-05 DIAGNOSIS — M25512 Pain in left shoulder: Secondary | ICD-10-CM | POA: Diagnosis not present

## 2020-11-02 DIAGNOSIS — M25562 Pain in left knee: Secondary | ICD-10-CM | POA: Diagnosis not present

## 2020-11-02 DIAGNOSIS — M545 Low back pain, unspecified: Secondary | ICD-10-CM | POA: Diagnosis not present

## 2020-11-02 DIAGNOSIS — S8001XA Contusion of right knee, initial encounter: Secondary | ICD-10-CM | POA: Diagnosis not present

## 2020-11-02 DIAGNOSIS — M542 Cervicalgia: Secondary | ICD-10-CM | POA: Diagnosis not present

## 2020-11-02 DIAGNOSIS — M25511 Pain in right shoulder: Secondary | ICD-10-CM | POA: Diagnosis not present

## 2020-11-02 DIAGNOSIS — M25551 Pain in right hip: Secondary | ICD-10-CM | POA: Diagnosis not present

## 2020-12-03 DIAGNOSIS — M25512 Pain in left shoulder: Secondary | ICD-10-CM | POA: Diagnosis not present

## 2020-12-03 DIAGNOSIS — M1732 Unilateral post-traumatic osteoarthritis, left knee: Secondary | ICD-10-CM | POA: Diagnosis not present

## 2021-01-02 DIAGNOSIS — M25512 Pain in left shoulder: Secondary | ICD-10-CM | POA: Diagnosis not present

## 2021-01-02 DIAGNOSIS — M1732 Unilateral post-traumatic osteoarthritis, left knee: Secondary | ICD-10-CM | POA: Diagnosis not present

## 2021-01-29 ENCOUNTER — Other Ambulatory Visit: Payer: Self-pay | Admitting: Internal Medicine

## 2021-01-29 DIAGNOSIS — I10 Essential (primary) hypertension: Secondary | ICD-10-CM

## 2021-01-29 DIAGNOSIS — E785 Hyperlipidemia, unspecified: Secondary | ICD-10-CM

## 2021-02-05 DIAGNOSIS — M4800 Spinal stenosis, site unspecified: Secondary | ICD-10-CM | POA: Diagnosis not present

## 2021-02-05 DIAGNOSIS — M5031 Other cervical disc degeneration,  high cervical region: Secondary | ICD-10-CM | POA: Diagnosis not present

## 2021-02-05 DIAGNOSIS — M5032 Other cervical disc degeneration, mid-cervical region, unspecified level: Secondary | ICD-10-CM | POA: Diagnosis not present

## 2021-02-05 DIAGNOSIS — M47819 Spondylosis without myelopathy or radiculopathy, site unspecified: Secondary | ICD-10-CM | POA: Diagnosis not present

## 2021-02-05 DIAGNOSIS — M546 Pain in thoracic spine: Secondary | ICD-10-CM | POA: Diagnosis not present

## 2021-02-05 DIAGNOSIS — M4802 Spinal stenosis, cervical region: Secondary | ICD-10-CM | POA: Diagnosis not present

## 2021-02-05 DIAGNOSIS — M2578 Osteophyte, vertebrae: Secondary | ICD-10-CM | POA: Diagnosis not present

## 2021-02-05 DIAGNOSIS — M4316 Spondylolisthesis, lumbar region: Secondary | ICD-10-CM | POA: Diagnosis not present

## 2021-02-05 DIAGNOSIS — M47816 Spondylosis without myelopathy or radiculopathy, lumbar region: Secondary | ICD-10-CM | POA: Diagnosis not present

## 2021-02-06 ENCOUNTER — Other Ambulatory Visit: Payer: Self-pay | Admitting: Student

## 2021-02-06 DIAGNOSIS — F322 Major depressive disorder, single episode, severe without psychotic features: Secondary | ICD-10-CM

## 2021-03-04 DIAGNOSIS — M545 Low back pain, unspecified: Secondary | ICD-10-CM | POA: Diagnosis not present

## 2021-03-04 DIAGNOSIS — M25512 Pain in left shoulder: Secondary | ICD-10-CM | POA: Diagnosis not present

## 2021-03-04 DIAGNOSIS — M542 Cervicalgia: Secondary | ICD-10-CM | POA: Diagnosis not present

## 2021-04-03 ENCOUNTER — Ambulatory Visit (INDEPENDENT_AMBULATORY_CARE_PROVIDER_SITE_OTHER): Payer: Medicare Other | Admitting: Internal Medicine

## 2021-04-03 VITALS — BP 142/61 | HR 92 | Temp 98.2°F | Ht 68.0 in | Wt 228.3 lb

## 2021-04-03 DIAGNOSIS — F322 Major depressive disorder, single episode, severe without psychotic features: Secondary | ICD-10-CM | POA: Diagnosis not present

## 2021-04-03 DIAGNOSIS — R5382 Chronic fatigue, unspecified: Secondary | ICD-10-CM | POA: Diagnosis not present

## 2021-04-03 DIAGNOSIS — M25512 Pain in left shoulder: Secondary | ICD-10-CM | POA: Diagnosis not present

## 2021-04-03 DIAGNOSIS — Z23 Encounter for immunization: Secondary | ICD-10-CM | POA: Diagnosis not present

## 2021-04-03 DIAGNOSIS — I10 Essential (primary) hypertension: Secondary | ICD-10-CM

## 2021-04-03 DIAGNOSIS — G8929 Other chronic pain: Secondary | ICD-10-CM | POA: Diagnosis not present

## 2021-04-03 MED ORDER — VENLAFAXINE HCL ER 150 MG PO CP24: 300.0000 mg | ORAL_CAPSULE | Freq: Every day | ORAL | 1 refills | Status: DC

## 2021-04-03 MED ORDER — BUPROPION HCL ER (XL) 150 MG PO TB24: ORAL_TABLET | ORAL | 1 refills | Status: DC

## 2021-04-03 NOTE — Patient Instructions (Addendum)
Dear Sarah [name],  Today we discussed your medications and this feeling of depression.  We will check some basic blood work on you today as well as your thyroid function to further evaluate this fatigue that you have been experiencing. We will restart you on the Wellbutrin XL 150 mg.  Please return in 1 month for a follow-up.
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
## 2021-04-04 ENCOUNTER — Other Ambulatory Visit: Payer: Self-pay | Admitting: Orthopedic Surgery

## 2021-04-04 DIAGNOSIS — M25512 Pain in left shoulder: Secondary | ICD-10-CM

## 2021-04-04 LAB — CMP14 + ANION GAP
ALT: 15 IU/L (ref 0–32)
AST: 17 IU/L (ref 0–40)
Albumin/Globulin Ratio: 1.3 (ref 1.2–2.2)
Albumin: 3.9 g/dL (ref 3.7–4.7)
Alkaline Phosphatase: 121 IU/L (ref 44–121)
Anion Gap: 15 mmol/L (ref 10.0–18.0)
BUN/Creatinine Ratio: 9 — ABNORMAL LOW (ref 12–28)
BUN: 8 mg/dL (ref 8–27)
Bilirubin Total: 0.3 mg/dL (ref 0.0–1.2)
CO2: 29 mmol/L (ref 20–29)
Calcium: 8.9 mg/dL (ref 8.7–10.3)
Chloride: 101 mmol/L (ref 96–106)
Creatinine, Ser: 0.91 mg/dL (ref 0.57–1.00)
Globulin, Total: 2.9 g/dL (ref 1.5–4.5)
Glucose: 95 mg/dL (ref 65–99)
Potassium: 3.7 mmol/L (ref 3.5–5.2)
Sodium: 145 mmol/L — ABNORMAL HIGH (ref 134–144)
Total Protein: 6.8 g/dL (ref 6.0–8.5)
eGFR: 66 mL/min/{1.73_m2} (ref >59)

## 2021-04-04 LAB — CBC WITH DIFFERENTIAL/PLATELET
Basophils Absolute: 0.1 10*3/uL (ref 0.0–0.2)
Basos: 1 %
EOS (ABSOLUTE): 0.2 10*3/uL (ref 0.0–0.4)
Eos: 3 %
Hematocrit: 37 % (ref 34.0–46.6)
Hemoglobin: 11.9 g/dL (ref 11.1–15.9)
Immature Grans (Abs): 0 10*3/uL (ref 0.0–0.1)
Immature Granulocytes: 0 %
Lymphocytes Absolute: 1.7 10*3/uL (ref 0.7–3.1)
Lymphs: 24 %
MCH: 29.8 pg (ref 26.6–33.0)
MCHC: 32.2 g/dL (ref 31.5–35.7)
MCV: 93 fL (ref 79–97)
Monocytes Absolute: 0.8 10*3/uL (ref 0.1–0.9)
Monocytes: 11 %
Neutrophils Absolute: 4.5 10*3/uL (ref 1.4–7.0)
Neutrophils: 61 %
Platelets: 262 10*3/uL (ref 150–450)
RBC: 3.99 x10E6/uL (ref 3.77–5.28)
RDW: 13.4 % (ref 11.7–15.4)
WBC: 7.3 10*3/uL (ref 3.4–10.8)

## 2021-04-04 LAB — TSH: TSH: 2.57 u[IU]/mL (ref 0.450–4.500)

## 2021-04-04 LAB — T4, FREE: Free T4: 1.09 ng/dL (ref 0.82–1.77)

## 2021-04-04 LAB — T3: T3, Total: 106 ng/dL (ref 71–180)

## 2021-04-06 ENCOUNTER — Other Ambulatory Visit: Payer: Medicare Other

## 2021-04-08 ENCOUNTER — Encounter: Payer: Self-pay | Admitting: Internal Medicine

## 2021-04-08 NOTE — Assessment & Plan Note (Signed)
Pt reporting little energy, falls, trouble with cognition.   Will check thyroid and basic labs.
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
## 2021-04-08 NOTE — Assessment & Plan Note (Signed)
Reports compliance with meds. BP elevated today, possibly due to recent prednisone and NSAID use.  On amlodipine 2.5 and hctz 12.5. Continue
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
## 2021-04-11 NOTE — Progress Notes (Signed)
Internal Medicine Clinic Attending  I saw and evaluated the patient.  I personally confirmed the key portions of the history and exam documented by Dr. Gawaluck and I reviewed pertinent patient test results.  The assessment, diagnosis, and plan were formulated together and I agree with the documentation in the resident's note.  

## 2021-04-15 ENCOUNTER — Ambulatory Visit
Admission: RE | Admit: 2021-04-15 | Discharge: 2021-04-15 | Disposition: A | Discharge status: Home / Self Care | Payer: Medicare Other | Source: Ambulatory Visit | Attending: Orthopedic Surgery | Admitting: Orthopedic Surgery

## 2021-04-15 DIAGNOSIS — S46012A Strain of muscle(s) and tendon(s) of the rotator cuff of left shoulder, initial encounter: Secondary | ICD-10-CM | POA: Diagnosis not present

## 2021-04-15 DIAGNOSIS — M25512 Pain in left shoulder: Secondary | ICD-10-CM

## 2021-04-17 DIAGNOSIS — M25512 Pain in left shoulder: Secondary | ICD-10-CM | POA: Diagnosis not present

## 2021-05-06 ENCOUNTER — Telehealth: Payer: Self-pay

## 2021-05-06 NOTE — Telephone Encounter (Signed)
RTC, VM obtained and message left that nurse was returning her call and to call triage back at her convenience.  SChaplin, RN,BSN

## 2021-05-06 NOTE — Telephone Encounter (Signed)
Pt is requesting a call back she stated that she bought OTC vit B 12 and she is wanting to know can sh take them

## 2021-05-07 ENCOUNTER — Telehealth: Payer: Self-pay

## 2021-05-07 NOTE — Telephone Encounter (Signed)
Return pt's call - no answer and mailbox is full, unable to leave a message. But according to previous telephone call on 10/24   "Pt is requesting a call back she stated that she bought OTC vit B 12 and she is wanting to know can she take them".

## 2021-05-07 NOTE — Telephone Encounter (Signed)
Pt Is returning the call from yesterday 10/24 about her vit that she bought over the counter

## 2021-05-08 NOTE — Telephone Encounter (Signed)
Pt was called again - mailbox is full, unable to leave a message.

## 2021-05-20 DIAGNOSIS — M25512 Pain in left shoulder: Secondary | ICD-10-CM | POA: Diagnosis not present

## 2021-07-28 ENCOUNTER — Other Ambulatory Visit: Payer: Self-pay | Admitting: Internal Medicine

## 2021-07-28 DIAGNOSIS — I10 Essential (primary) hypertension: Secondary | ICD-10-CM

## 2021-07-28 DIAGNOSIS — E785 Hyperlipidemia, unspecified: Secondary | ICD-10-CM

## 2021-08-16 DIAGNOSIS — M25512 Pain in left shoulder: Secondary | ICD-10-CM | POA: Diagnosis not present

## 2021-08-16 DIAGNOSIS — M1712 Unilateral primary osteoarthritis, left knee: Secondary | ICD-10-CM | POA: Diagnosis not present

## 2021-08-26 ENCOUNTER — Other Ambulatory Visit: Payer: Self-pay | Admitting: Internal Medicine

## 2021-08-26 DIAGNOSIS — G8929 Other chronic pain: Secondary | ICD-10-CM

## 2021-09-20 DIAGNOSIS — M1712 Unilateral primary osteoarthritis, left knee: Secondary | ICD-10-CM | POA: Diagnosis not present

## 2021-10-11 DIAGNOSIS — M545 Low back pain, unspecified: Secondary | ICD-10-CM | POA: Diagnosis not present

## 2021-10-11 DIAGNOSIS — M542 Cervicalgia: Secondary | ICD-10-CM | POA: Diagnosis not present

## 2021-11-04 DIAGNOSIS — M545 Low back pain, unspecified: Secondary | ICD-10-CM | POA: Diagnosis not present

## 2021-11-04 DIAGNOSIS — M542 Cervicalgia: Secondary | ICD-10-CM | POA: Diagnosis not present

## 2022-02-03 ENCOUNTER — Other Ambulatory Visit: Payer: Self-pay | Admitting: Internal Medicine

## 2022-02-03 DIAGNOSIS — I10 Essential (primary) hypertension: Secondary | ICD-10-CM

## 2022-02-03 DIAGNOSIS — E785 Hyperlipidemia, unspecified: Secondary | ICD-10-CM

## 2022-03-06 ENCOUNTER — Encounter: Payer: Medicare Other | Admitting: Internal Medicine

## 2022-03-13 ENCOUNTER — Other Ambulatory Visit: Payer: Self-pay | Admitting: Internal Medicine

## 2022-03-13 DIAGNOSIS — F322 Major depressive disorder, single episode, severe without psychotic features: Secondary | ICD-10-CM

## 2022-04-25 ENCOUNTER — Telehealth: Payer: Self-pay

## 2022-04-25 NOTE — Patient Outreach (Addendum)
  Care Coordination   Initial Visit Note   04/25/2022 Name: KAYTELYNN SCRIPTER MRN: [id number] DOB: May 05, 1946  Scot Dock is a 76 y.o. year old female who sees Aldine Contes, MD for primary care. I spoke with  Scot Dock by phone today.  What matters to the patients health and wellness today?  Home repairs    Goals Addressed             This Visit's Progress    Home Repairs       Care Coordination Interventions: Advised patient to Schedule Annual wellness visit with Clinic Patient concerned with home repairs-working with [employer] at this time on repairs.  Discussed  CM providing possible resources for home repairs  if needed.           SDOH assessments and interventions completed:  Yes     Care Coordination Interventions Activated:  Yes  Care Coordination Interventions:  Yes, provided   Follow up plan: Follow up call scheduled for November    Encounter Outcome:  Pt. Visit Completed   Jone Baseman, RN, MSN [facility] Care Management Coordinator Direct Line [phone]

## 2022-04-25 NOTE — Patient Instructions (Signed)
Visit Information  Thank you for taking time to visit with me today. Please don't hesitate to contact me if I can be of assistance to you.   Following are the goals we discussed today:   Goals Addressed             This Visit's Progress    Home Repairs       Care Coordination Interventions: Advised patient to Schedule Annual wellness visit with Clinic Patient concerned with home repairs-working with [employer] at this time on repairs.  Discussed  CM providing possible resources for home repairs  if needed.           Our next appointment is by telephone on 05/30/22 at 1100  Please call the care guide team at [phone] if you need to cancel or reschedule your appointment.   If you are experiencing a Mental Health or [place] or need someone to talk to, please call the Suicide and Crisis Lifeline: 988   The patient verbalized understanding of instructions, educational materials, and care plan provided today and agreed to receive a mailed copy of patient instructions, educational materials, and care plan.   Telephone follow up appointment with care management team member scheduled for: November   Katty Fretwell J Euclide Granito, RN, MSN [facility] [facility] Care Management Coordinator Direct Line [phone]

## 2022-04-25 NOTE — Patient Outreach (Signed)
  Care Coordination   04/25/2022 Name: Sarah [name] MRN: [id number] DOB: 22-Oct-1945   Care Coordination Outreach Attempts:  An unsuccessful telephone outreach was attempted today to offer the patient information about available care coordination services as a benefit of their health plan.   Follow Up Plan:  Additional outreach attempts will be made to offer the patient care coordination information and services.   Encounter Outcome:  No Answer  Care Coordination Interventions Activated:  No   Care Coordination Interventions:  No, not indicated    Jone Baseman, RN, MSN [facility] Care Management Care Management Coordinator Direct Line [phone]

## 2022-05-04 ENCOUNTER — Other Ambulatory Visit: Payer: Self-pay | Admitting: Internal Medicine

## 2022-05-04 DIAGNOSIS — I10 Essential (primary) hypertension: Secondary | ICD-10-CM

## 2022-05-04 DIAGNOSIS — E785 Hyperlipidemia, unspecified: Secondary | ICD-10-CM

## 2022-05-14 ENCOUNTER — Ambulatory Visit (HOSPITAL_COMMUNITY)
Admission: RE | Admit: 2022-05-14 | Discharge: 2022-05-14 | Disposition: A | Discharge status: Home / Self Care | Payer: Medicare Other | Source: Ambulatory Visit | Attending: Internal Medicine | Admitting: Internal Medicine

## 2022-05-14 ENCOUNTER — Other Ambulatory Visit: Payer: Self-pay

## 2022-05-14 ENCOUNTER — Encounter: Payer: Self-pay | Admitting: Internal Medicine

## 2022-05-14 ENCOUNTER — Ambulatory Visit (INDEPENDENT_AMBULATORY_CARE_PROVIDER_SITE_OTHER): Payer: Medicare Other | Admitting: Internal Medicine

## 2022-05-14 VITALS — BP 132/65 | HR 75 | Temp 97.5°F | Ht 68.0 in | Wt 201.0 lb

## 2022-05-14 DIAGNOSIS — M25562 Pain in left knee: Secondary | ICD-10-CM | POA: Insufficient documentation

## 2022-05-14 DIAGNOSIS — M25512 Pain in left shoulder: Secondary | ICD-10-CM | POA: Insufficient documentation

## 2022-05-14 DIAGNOSIS — Z0000 Encounter for general adult medical examination without abnormal findings: Secondary | ICD-10-CM

## 2022-05-14 DIAGNOSIS — F32A Depression, unspecified: Secondary | ICD-10-CM | POA: Diagnosis not present

## 2022-05-14 DIAGNOSIS — Z23 Encounter for immunization: Secondary | ICD-10-CM

## 2022-05-14 DIAGNOSIS — W19XXXA Unspecified fall, initial encounter: Secondary | ICD-10-CM

## 2022-05-14 DIAGNOSIS — E785 Hyperlipidemia, unspecified: Secondary | ICD-10-CM

## 2022-05-14 DIAGNOSIS — I10 Essential (primary) hypertension: Secondary | ICD-10-CM | POA: Diagnosis not present

## 2022-05-14 DIAGNOSIS — F322 Major depressive disorder, single episode, severe without psychotic features: Secondary | ICD-10-CM

## 2022-05-14 NOTE — Patient Instructions (Signed)
Thank you, Ms.Scot Dock for allowing [place] to provide your care today.  Left shoulder I will call with results of x-ray.  Left knee I will call with results.  Depression I am referring you to psychiatry.  Please call [facility]. Their number is [phone].   I have ordered the following labs for you:   Lab Orders         TSH         BMP8+Anion Gap         Lipid Profile       Referrals ordered today:    Referral Orders         Ambulatory referral to Psychiatry      I have ordered the following medication/changed the following medications:   Stop the following medications: There are no discontinued medications.   Start the following medications: No orders of the defined types were placed in this encounter.    Follow up:  1 month with Dr. Dareen Piano    We look forward to seeing you next time. Please call our clinic at [phone] if you have any questions or concerns. The best time to call is Monday-Friday from 9am-4pm, but there is someone available 24/7. If after hours or the weekend, call the main hospital number and ask for the Internal Medicine Resident On-Call. If you need medication refills, please notify your pharmacy one week in advance and they will send [place] a request.   Thank you for trusting me with your care. Wishing you the best!   Christiana Fuchs, [place]

## 2022-05-15 DIAGNOSIS — W19XXXA Unspecified fall, initial encounter: Secondary | ICD-10-CM

## 2022-05-15 HISTORY — DX: Unspecified fall, initial encounter: W19.XXXA

## 2022-05-15 LAB — BMP8+ANION GAP
Anion Gap: 15 mmol/L (ref 10.0–18.0)
BUN/Creatinine Ratio: 8 — ABNORMAL LOW (ref 12–28)
BUN: 7 mg/dL — ABNORMAL LOW (ref 8–27)
CO2: 27 mmol/L (ref 20–29)
Calcium: 9.5 mg/dL (ref 8.7–10.3)
Chloride: 101 mmol/L (ref 96–106)
Creatinine, Ser: 0.89 mg/dL (ref 0.57–1.00)
Glucose: 97 mg/dL (ref 70–99)
Potassium: 3.8 mmol/L (ref 3.5–5.2)
Sodium: 143 mmol/L (ref 134–144)
eGFR: 68 mL/min/{1.73_m2} (ref >59)

## 2022-05-15 LAB — TSH: TSH: 3.68 u[IU]/mL (ref 0.450–4.500)

## 2022-05-15 LAB — LIPID PANEL
Chol/HDL Ratio: 3.8 ratio (ref 0.0–4.4)
Cholesterol, Total: 202 mg/dL — ABNORMAL HIGH (ref 100–199)
HDL: 53 mg/dL (ref >39)
LDL Chol Calc (NIH): 128 mg/dL — ABNORMAL HIGH (ref 0–99)
Triglycerides: 116 mg/dL (ref 0–149)
VLDL Cholesterol Cal: 21 mg/dL (ref 5–40)

## 2022-05-15 MED ORDER — ATORVASTATIN CALCIUM 20 MG PO TABS: 20.0000 mg | ORAL_TABLET | Freq: Every day | ORAL | 11 refills | Status: DC

## 2022-05-15 MED ORDER — BUPROPION HCL ER (XL) 300 MG PO TB24: 300.0000 mg | ORAL_TABLET | ORAL | 3 refills | Status: DC
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
## 2022-05-15 NOTE — Assessment & Plan Note (Signed)
She endorses adherence with medications including amlodipine 2.5 mg and HCTZ 12.5 mg. Blood pressure was elevated at 140/72 and decreased to 132/65. She denies blurry vision, headache, shortness of breath and chest pain. A/P: BMP showed creatinine and electrolytes wnl Plan to repeat BMP 11/24

## 2022-05-15 NOTE — Assessment & Plan Note (Signed)
Flu shot given

## 2022-05-15 NOTE — Assessment & Plan Note (Signed)
Patient reports adherence to atorvastatin 10 mg for primary prevention. She denies side effects with medication. Repeat lipid panel showed LDL of 126.  A/P: Atorvastatin 20 mg sent to pharamacy.
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
## 2022-05-15 NOTE — Assessment & Plan Note (Signed)
PhQ-9 elevated at 14. She take venlafaxine 300 mg and welbutrin 150 mg. Her son passed away in 2013-09-28 and husband about 2 years ago. She is doing the best she can to cope with this. She endorses difficulty sleeping, decreased appetite, and severe fatigue. A/P: Continue venlafaxine '300mg'$  qd Increase wellbutrin XL from 150 mg to 300 mg Referral to psychiatry Follow-up in 4 weeks  TSH within normal limits

## 2022-05-16 NOTE — Progress Notes (Signed)
Internal Medicine Clinic Attending  I saw and evaluated the patient.  I personally confirmed the key portions of the history and exam documented by Dr. Masters and I reviewed pertinent patient test results.  The assessment, diagnosis, and plan were formulated together and I agree with the documentation in the resident's note.  

## 2022-05-30 ENCOUNTER — Telehealth: Payer: Self-pay

## 2022-05-30 NOTE — Patient Outreach (Signed)
  Care Coordination   05/30/2022 Name: Sarah [name] MRN: [id number] DOB: [birth year]   Care Coordination Outreach Attempts:  An unsuccessful telephone outreach was attempted today to offer the patient information about available care coordination services as a benefit of their health plan.   Follow Up Plan:  Additional outreach attempts will be made to offer the patient care coordination information and services.   Encounter Outcome:  No Answer  Care Coordination Interventions Activated:  No   Care Coordination Interventions:  No, not indicated    Jone Baseman, RN, MSN [facility] Care Management Care Management Coordinator Direct Line [phone]

## 2022-06-23 ENCOUNTER — Ambulatory Visit: Payer: Self-pay

## 2022-06-23 NOTE — Patient Outreach (Signed)
  Care Coordination   06/23/2022 Name: Sarah [name] MRN: [id number] DOB: 01-12-1946   Care Coordination Outreach Attempts:  A second unsuccessful outreach was attempted today to offer the patient with information about available care coordination services as a benefit of their health plan.     Follow Up Plan:  Additional outreach attempts will be made to offer the patient care coordination information and services.   Encounter Outcome:  No Answer   Care Coordination Interventions:  No, not indicated    Jone Baseman, RN, MSN [facility] Care Management Coordinator Direct Line [phone]

## 2022-07-02 ENCOUNTER — Encounter: Payer: Self-pay | Admitting: *Deleted

## 2022-07-15 ENCOUNTER — Encounter: Payer: Self-pay | Admitting: Student

## 2022-07-15 ENCOUNTER — Ambulatory Visit (INDEPENDENT_AMBULATORY_CARE_PROVIDER_SITE_OTHER): Payer: Medicare Other | Admitting: Student

## 2022-07-15 ENCOUNTER — Telehealth: Payer: Self-pay

## 2022-07-15 ENCOUNTER — Ambulatory Visit (HOSPITAL_COMMUNITY)
Admission: RE | Admit: 2022-07-15 | Discharge: 2022-07-15 | Disposition: A | Discharge status: Home / Self Care | Payer: Medicare Other | Source: Ambulatory Visit | Attending: Internal Medicine | Admitting: Internal Medicine

## 2022-07-15 VITALS — BP 126/62 | HR 74 | Temp 98.7°F | Ht 67.0 in | Wt 193.1 lb

## 2022-07-15 DIAGNOSIS — W19XXXA Unspecified fall, initial encounter: Secondary | ICD-10-CM

## 2022-07-15 DIAGNOSIS — M25562 Pain in left knee: Secondary | ICD-10-CM | POA: Diagnosis not present

## 2022-07-15 DIAGNOSIS — M25512 Pain in left shoulder: Secondary | ICD-10-CM | POA: Diagnosis not present

## 2022-07-15 DIAGNOSIS — F322 Major depressive disorder, single episode, severe without psychotic features: Secondary | ICD-10-CM | POA: Diagnosis not present

## 2022-07-15 DIAGNOSIS — I10 Essential (primary) hypertension: Secondary | ICD-10-CM | POA: Diagnosis not present

## 2022-07-15 DIAGNOSIS — M79602 Pain in left arm: Secondary | ICD-10-CM

## 2022-07-15 DIAGNOSIS — G8929 Other chronic pain: Secondary | ICD-10-CM

## 2022-07-15 DIAGNOSIS — Z139 Encounter for screening, unspecified: Secondary | ICD-10-CM

## 2022-07-15 MED ORDER — GABAPENTIN 300 MG PO CAPS: 600.0000 mg | ORAL_CAPSULE | Freq: Three times a day (TID) | ORAL | 2 refills | Status: DC

## 2022-07-15 MED ORDER — GABAPENTIN 300 MG PO CAPS: ORAL_CAPSULE | ORAL | 2 refills | Status: DC

## 2022-07-15 MED ORDER — BUPROPION HCL ER (XL) 150 MG PO TB24: 150.0000 mg | ORAL_TABLET | ORAL | 3 refills | Status: DC
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
## 2022-07-15 NOTE — Patient Instructions (Addendum)
Thank you, Sarah [name] for allowing [place] to provide your care today. Today we discussed .  Depression/Anxiety We will be increasing your gabapentin and decreasing your wellbutrin. Please take the gabapentin 2 pills three times a day and please take the new wellbutrin prescription. I have also placed a referral for you to meet with Wyatt Portela, the new counselor in our clinic. Please be aware of a phone call from them. I have also placed a referral to the social work team to see if they can help you with some of the concerns at home.   Weight Loss I believe this is coming from the increase stress in your life, please continue to take your medications and meet with [facility]. If you begin having night sweats, continue to lose weight, please discuss this at your next visit in one month.   Left arm and leg pain Please follow up with physical therapy. You can take tylenol as needed for pain and continue to take your gabapentin. We will get an xray of your upper arm as well.      I have ordered the following labs for you:  Lab Orders  No laboratory test(s) ordered today     Referrals ordered today:   Referral Orders         AMB Referral to [place] ([facility] Patients)         Ambulatory referral to [place]         Ambulatory referral to Physical Therapy         Ambulatory Referral for DME      I have ordered the following medication/changed the following medications:   Stop the following medications: Medications Discontinued During This Encounter  Medication Reason   gabapentin (NEURONTIN) 300 MG capsule Reorder   buPROPion (WELLBUTRIN XL) 300 MG 24 hr tablet Reorder   gabapentin (NEURONTIN) 300 MG capsule Reorder     Start the following medications: Meds ordered this encounter  Medications   buPROPion (WELLBUTRIN XL) 150 MG 24 hr tablet    Sig: Take 1 tablet (150 mg total) by mouth every morning.    Dispense:  90 tablet    Refill:  3   DISCONTD:  gabapentin (NEURONTIN) 300 MG capsule    Sig: Take 2 capsules (600 mg total) by mouth 3 (three) times daily. TAKE 1 CAPSULE BY MOUTH TWICE DAILY AND 2 CAPSULES AT BEDTIME    Dispense:  360 capsule    Refill:  2   gabapentin (NEURONTIN) 300 MG capsule    Sig: TAKE 2 CAPSULE BY MOUTH IN THE MORNING, ONE CAPSULE BY MOUTH IN THE AFTERNOON AND 2 CAPSULES AT BEDTIME    Dispense:  360 capsule    Refill:  2     Follow up:  1 month follow up     Should you have any questions or concerns please call the internal medicine clinic at [phone].    Sanjuana Letters, D.O. [place]

## 2022-07-15 NOTE — Assessment & Plan Note (Addendum)
BP well controlled amlodipine 2.5 mg, HCTZ 12.5 mg daily. Of note she has been drinking multiple V8's in a day and adding salt, discussed these normally are high in sodium and adding more salt could lead to fluid retention and increase blood pressures.  -continue amlodipine 2.'5mg'$  daily and HCTZ 12.5 mg daily

## 2022-07-15 NOTE — Telephone Encounter (Signed)
Incoming fax from pharmacy: Gabapentin " Plan requires specific directions to process the prescription. Please fax back with frequency of how patient will be using the medication and days supply limitations"

## 2022-07-15 NOTE — Telephone Encounter (Signed)
Spoke with pharmacy and corrected directions
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
## 2022-07-15 NOTE — Assessment & Plan Note (Addendum)
Patient fell over two months and landed on a chair affecting her left upper and lower extremities. She has a history of left sided pain that was sharp and after her fall this changed to more stabbing pain. She uses a cane to help ambulate. She was seen in the [facility], imaging at the time showed no acute fracture but did have a healed proximal fibular fracture. The pain has improved but continues to have pain of the left upper/lower extremities with movement. She is unable to localize the pain, but points to the left shoulder/upper arm and entire knee. Imaging did shoe some evidence of arhtritis of the both the shoulder and knee that are likely contributing.  Her active/passive ROM are intact for her left upper/lower extremities however they are limited by pain. There is no bony deformity. Low suspicion for rotator cuff injury.   -Will refer to PT and increase gabapentin dosing from '300mg'$  morning, afternoon, and '600mg'$  QHS to 600 mg am, 300 mg in the afternoon, and 600 mg QHS.  -Encouraged OTC tylenol 500 mg BID as needed.  -If pain persists can consider steroid injection for her arthritis.  -Will also obtain a left humerus xray with some of her pain moving distally, halfway down the humerus.  -DME order for rollator.  Xray of left humerus negative for acute fracture but evidence of rotator cuff arthropathy. Would recommend continue with PT and reevaluation may need further imaging of shoulder with MRI and steroid injections

## 2022-07-17 ENCOUNTER — Ambulatory Visit: Payer: Self-pay

## 2022-07-17 NOTE — Progress Notes (Signed)
Internal Medicine Clinic Attending  Case discussed with Dr. Katsadouros  At the time of the visit.  We reviewed the resident's history and exam and pertinent patient test results.  I agree with the assessment, diagnosis, and plan of care documented in the resident's note.  

## 2022-07-17 NOTE — Patient Outreach (Signed)
  Care Coordination   07/17/2022 Name: Sarah [name] MRN: [id number] DOB: 01-26-1946   Care Coordination Outreach Attempts:  A third unsuccessful outreach was attempted today to offer the patient with information about available care coordination services as a benefit of their health plan.   Follow Up Plan:  No further outreach attempts will be made at this time. We have been unable to contact the patient to offer or enroll patient in care coordination services  Encounter Outcome:  No Answer   Care Coordination Interventions:  No, not indicated    Jone Baseman, RN, MSN [place] Care Management Coordinator Direct Line [phone]

## 2022-07-21 ENCOUNTER — Ambulatory Visit: Payer: Self-pay

## 2022-07-21 NOTE — Patient Outreach (Addendum)
  Care Coordination   07/21/2022 Name: Sarah [name] MRN: [id number] DOB: Mar 01, 1946   Care Coordination Outreach Attempts:  An unsuccessful telephone outreach was attempted today to offer the patient information about available care coordination services as a benefit of their health plan.   Follow Up Plan:  No further outreach attempts will be made at this time. We have been unable to contact the patient to offer or enroll patient in care coordination services  Encounter Outcome:  No Answer   Care Coordination Interventions:  No, not indicated    Jone Baseman, RN, MSN [place] [facility] Care Management Coordinator Direct Line [phone]

## 2022-08-14 ENCOUNTER — Other Ambulatory Visit: Payer: Self-pay | Admitting: Internal Medicine

## 2022-08-14 DIAGNOSIS — I10 Essential (primary) hypertension: Secondary | ICD-10-CM

## 2022-09-11 ENCOUNTER — Ambulatory Visit: Payer: Medicare HMO

## 2022-09-11 ENCOUNTER — Ambulatory Visit: Payer: Medicare HMO | Admitting: *Deleted

## 2022-09-11 VITALS — BP 138/87 | HR 80 | Temp 98.6°F | Wt 194.9 lb

## 2022-09-11 VITALS — BP 138/87 | HR 80 | Temp 98.6°F | Wt 194.8 lb

## 2022-09-11 DIAGNOSIS — I10 Essential (primary) hypertension: Secondary | ICD-10-CM

## 2022-09-11 DIAGNOSIS — S82102A Unspecified fracture of upper end of left tibia, initial encounter for closed fracture: Secondary | ICD-10-CM | POA: Diagnosis not present

## 2022-09-11 DIAGNOSIS — Z0000 Encounter for general adult medical examination without abnormal findings: Secondary | ICD-10-CM | POA: Diagnosis not present

## 2022-09-11 DIAGNOSIS — M67912 Unspecified disorder of synovium and tendon, left shoulder: Secondary | ICD-10-CM | POA: Diagnosis not present

## 2022-09-11 DIAGNOSIS — M67814 Other specified disorders of tendon, left shoulder: Secondary | ICD-10-CM

## 2022-09-11 DIAGNOSIS — F322 Major depressive disorder, single episode, severe without psychotic features: Secondary | ICD-10-CM

## 2022-09-11 MED ORDER — VENLAFAXINE HCL ER 150 MG PO CP24: 150.0000 mg | ORAL_CAPSULE | Freq: Every day | ORAL | 0 refills | Status: DC

## 2022-09-11 MED ORDER — VENLAFAXINE HCL ER 75 MG PO CP24: 75.0000 mg | ORAL_CAPSULE | Freq: Every day | ORAL | 0 refills | Status: DC

## 2022-09-11 MED ORDER — SERTRALINE HCL 25 MG PO TABS: 25.0000 mg | ORAL_TABLET | Freq: Every day | ORAL | 0 refills | Status: DC

## 2022-09-11 MED ORDER — NAPROXEN 500 MG PO TABS: 500.0000 mg | ORAL_TABLET | Freq: Two times a day (BID) | ORAL | 0 refills | Status: AC

## 2022-09-11 MED ORDER — NAPROXEN 500 MG PO TABS: 500.0000 mg | ORAL_TABLET | Freq: Two times a day (BID) | ORAL | 0 refills | Status: DC

## 2022-09-11 NOTE — Patient Instructions (Addendum)
Thank you, Ms.Scot Dock for allowing [place] to provide your care today. Today we discussed :  Shoulder pain: Please try to get scheduled with PT. I would recommend getting follow up with your orthopedic doctor at [facility] [phone]). I am prescribing naproxen '500mg'$  BID x 10 days to help with pain.  Major depression: I am going to change your venlafaxine. For the next 7 days take '150mg'$  daily. Then take 75 mg daily startin 09/19/22 for 7 days total. Then stop. On 09/19/22 start taking sertraline 25 mg daily. If sertraline is not helpful we will need to get you in with psychiatry. Also look out to schedule talk therapy with Milus Height.      I have ordered the following medication/changed the following medications:   Stop the following medications: Medications Discontinued During This Encounter  Medication Reason   venlafaxine XR (EFFEXOR-XR) 150 MG 24 hr capsule Reorder     Start the following medications: Meds ordered this encounter  Medications   venlafaxine XR (EFFEXOR-XR) 150 MG 24 hr capsule    Sig: Take 1 capsule (150 mg total) by mouth daily with breakfast for 7 days.    Dispense:  7 capsule    Refill:  0   venlafaxine XR (EFFEXOR XR) 75 MG 24 hr capsule    Sig: Take 1 capsule (75 mg total) by mouth daily for 7 days.    Dispense:  7 capsule    Refill:  0   sertraline (ZOLOFT) 25 MG tablet    Sig: Take 1 tablet (25 mg total) by mouth daily.    Dispense:  30 tablet    Refill:  0   naproxen (NAPROSYN) 500 MG tablet    Sig: Take 1 tablet (500 mg total) by mouth 2 (two) times daily with a meal for 10 days.    Dispense:  20 tablet    Refill:  0     Follow up: 2-3 months     We look forward to seeing you next time. Please call our clinic at [phone] if you have any questions or concerns. The best time to call is Monday-Friday from 9am-4pm, but there is someone available 24/7. If after hours or the weekend, call the main hospital number and ask for the Internal  Medicine Resident On-Call. If you need medication refills, please notify your pharmacy one week in advance and they will send [place] a request.   Thank you for trusting me with your care. Wishing you the best!   Iona Coach, MD [place]

## 2022-09-11 NOTE — Patient Instructions (Signed)

## 2022-09-11 NOTE — Progress Notes (Deleted)
HTN BP well controlled amlodipine 2.5 mg, HCTZ 12.5 mg daily. Of note she has been drinking multiple V8's in a day and adding salt, discussed these normally are high in sodium and adding more salt could lead to fluid retention and increase blood pressures.  -continue amlodipine 2.'5mg'$  daily and HCTZ 12.5 mg daily   MDD -refer to [facility] for assistance with her depression/anxiety.  -for her social needs will refer to CCM.  -decrease wellbutrin back to '150mg'$  as may lead to increased anxiety and weight loss -increase gabapentin dosing to 600 mg am, 300 mg afternoon, and 600 mg QHS.  -follow up in our clinic in one month Effexor 150 daily  PHQ 19 =>19 GAD 7 19=>13  HLD Patient reports adherence to atorvastatin 10 mg for primary prevention. She denies side effects with medication. Repeat lipid panel showed LDL of 126.  A/P: Atorvastatin 20 mg sent to pharamacy.
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
## 2022-09-12 ENCOUNTER — Encounter: Payer: Self-pay | Admitting: *Deleted

## 2022-09-15 DIAGNOSIS — M67814 Other specified disorders of tendon, left shoulder: Secondary | ICD-10-CM | POA: Insufficient documentation

## 2022-09-15 NOTE — Assessment & Plan Note (Signed)
Patient continues to endorse wanting to lay around and sleep all of the time. She has low energy. Her appetite is also decreased. She does not think her mood is any better than the prior time she was seen in clinic. PHQ9 from 19 to 19, GAD7 from 19 to 13. These mood changes have  now been going on for months. Has not yet seen Milus Height whom she was referred to at prior visit. Will attempt to transition from effexor to sertraline. Given her multi-agent regimen, would favor psychiatry referral if no benefit with sertraline. Denies SI/HI. -continue wellbutrin '150mg'$  -continue gabapentin dosing to 600 mg am, 300 mg afternoon, and 600 mg QHS. -effexor 150 x7 days, '75mg'$  x7 days, then stop -start sertraline 25 mg once start taking effexor '75mg'$ , titrate as needed -Milus Height -consider psych referral

## 2022-09-15 NOTE — Progress Notes (Signed)
Established Patient Office Visit  Subjective   Patient ID: Sarah [name], female    DOB: 1945/10/06  Age: 77 y.o. MRN: [id number]  Chief Complaint  Patient presents with   62mh follow up    Ms. HChynowethis a 77y/o female with a pmh outlined below. Please see A&P for HPI information.      Review of Systems  All other systems reviewed and are negative.     Objective:     BP 138/87 (BP Location: Left Arm, Patient Position: Sitting, Cuff Size: Normal)   Pulse 80   Temp 98.6 F (37 C) (Oral)   Wt 194 lb 12.8 oz (88.4 kg)   SpO2 100%   BMI 30.51 kg/m    Physical Exam Constitutional:      General: She is in acute distress.     Appearance: She is obese. She is not ill-appearing.  Eyes:     General: No scleral icterus.    Extraocular Movements: Extraocular movements intact.     Conjunctiva/sclera: Conjunctivae normal.  Cardiovascular:     Rate and Rhythm: Normal rate and regular rhythm.     Pulses: Normal pulses.     Heart sounds: Normal heart sounds. No murmur heard.    No gallop.  Pulmonary:     Effort: Pulmonary effort is normal. No respiratory distress.     Breath sounds: Normal breath sounds. No wheezing or rales.  Musculoskeletal:     Right lower leg: No edema.     Left lower leg: No edema.     Comments: reduced active and passive L shoulder ROM to about 75 degrees in flexion and abduction. She is unable to internally rotate the L shoulder to place the hand on her back. Active ROM appears to be reduced secondary to pain. She has 5/5 strength of the L shoulder to flexion, abduction, adduction, internal rotation, external rotation. There is pain on empty can test of the L. There is also pain with flexion and abduction against resistance. There is ptp over the bicipital tendon.  Skin:    General: Skin is warm and dry.     Capillary Refill: Capillary refill takes less than 2 seconds.  Neurological:     Mental Status: She is alert.      No results found for any  visits on 09/11/22.    The 10-year ASCVD risk score (Arnett DK, et al., 2019) is: 16.9%    Assessment & Plan:   Problem List Items Addressed This Visit       Cardiovascular and Mediastinum   Essential hypertension, benign (Chronic)    BP a little elevated at 138/87. No changes today, can trend and consider increasing dose of amlodipine and HCTZ given such low dose of both. -continue amlodipine 2.'5mg'$  daily and HCTZ 12.5 mg daily        Musculoskeletal and Integument   Tendinosis of left rotator cuff    Patient with recent fall 2 months ago continues to have L arm and L leg pain. Appears she has had long standing pain in both of these related to tendinopathy and arthritis, but acutely exacerbated in the fall. No acute fractures on radiograph but there was a healing L tibial fracture. At last visit PT, DME rollator, OTC tylenol, and increased gabapentin were prescribed. Patient has not yet gotten PT. Says pain is still 6-7/10. On exam patient has reduced active and passive L shoulder ROM to about 75 degrees in flexion and abduction. She is unable  to internally rotate the L shoulder to place the hand on her back. Active ROM appears to be reduced secondary to pain. She has 5/5 strength of the L shoulder to flexion, abduction, adduction, internal rotation, external rotation. There is pain on empty can test of the L. There is also pain with flexion and abduction against resistance. There is ptp over the bicipital tendon. Of note MRI left shoulder performed 04/2021 demonstrated Moderate tendinosis of the supraspinatus tendon with a partial-thickness bursal surface tear,Mild tendinosis of the infraspinatus tendon,Mild tendinosis of the subscapularis tendon,Moderate tendinosis of the intra-articular portion of the long head of the biceps tendon. I do not think the patient has a complete tear at this point but certainly could have worsening of the above MRI findings and also worsening degenerative changes  of the glenohumeral joint. Previously seen at [facility] by ortho, gave her the number to call back and be seen. Would recommend they order additional imaging, although I do suspect an MRI would be warranted. Can consider steroid injections if no relief after 2 weeks scheduled naproxen. Renal function good for NSAIDS. -patient to call ortho provider at [facility] for f/u -naproxen 500 q12 hours x 2 weeks -PT -rollator         Other   Major depressive disorder    Patient continues to endorse wanting to lay around and sleep all of the time. She has low energy. Her appetite is also decreased. She does not think her mood is any better than the prior time she was seen in clinic. PHQ9 from 19 to 19, GAD7 from 19 to 13. These mood changes have  now been going on for months. Has not yet seen Milus Height whom she was referred to at prior visit. Will attempt to transition from effexor to sertraline. Given her multi-agent regimen, would favor psychiatry referral if no benefit with sertraline. Denies SI/HI. -continue wellbutrin '150mg'$  -continue gabapentin dosing to 600 mg am, 300 mg afternoon, and 600 mg QHS. -effexor 150 x7 days, '75mg'$  x7 days, then stop -start sertraline 25 mg once start taking effexor '75mg'$ , titrate as needed -Milus Height -consider psych referral      Relevant Medications   sertraline (ZOLOFT) 25 MG tablet (Start on 09/19/2022)   venlafaxine XR (EFFEXOR XR) 75 MG 24 hr capsule (Start on 09/19/2022)   venlafaxine XR (EFFEXOR-XR) 150 MG 24 hr capsule   Other Visit Diagnoses     Tendinopathy of left rotator cuff    -  Primary   Relevant Medications   naproxen (NAPROSYN) 500 MG tablet   Closed fracture of proximal end of left tibia, unspecified fracture morphology, initial encounter       Relevant Medications   naproxen (NAPROSYN) 500 MG tablet       No follow-ups on file.    Iona Coach, MD

## 2022-09-15 NOTE — Assessment & Plan Note (Signed)
BP a little elevated at 138/87. No changes today, can trend and consider increasing dose of amlodipine and HCTZ given such low dose of both. -continue amlodipine 2.'5mg'$  daily and HCTZ 12.5 mg daily

## 2022-09-15 NOTE — Assessment & Plan Note (Addendum)
Patient with recent fall 2 months ago continues to have L arm and L leg pain. Appears she has had long standing pain in both of these related to tendinopathy and arthritis, but acutely exacerbated in the fall. No acute fractures on radiograph but there was a healing L tibial fracture. At last visit PT, DME rollator, OTC tylenol, and increased gabapentin were prescribed. Patient has not yet gotten PT. Says pain is still 6-7/10. On exam patient has reduced active and passive L shoulder ROM to about 75 degrees in flexion and abduction. She is unable to internally rotate the L shoulder to place the hand on her back. Active ROM appears to be reduced secondary to pain. She has 5/5 strength of the L shoulder to flexion, abduction, adduction, internal rotation, external rotation. There is pain on empty can test of the L. There is also pain with flexion and abduction against resistance. There is ptp over the bicipital tendon. Of note MRI left shoulder performed 04/2021 demonstrated Moderate tendinosis of the supraspinatus tendon with a partial-thickness bursal surface tear,Mild tendinosis of the infraspinatus tendon,Mild tendinosis of the subscapularis tendon,Moderate tendinosis of the intra-articular portion of the long head of the biceps tendon. I do not think the patient has a complete tear at this point but certainly could have worsening of the above MRI findings and also worsening degenerative changes of the glenohumeral joint. Previously seen at [facility] by ortho, gave her the number to call back and be seen. Would recommend they order additional imaging, although I do suspect an MRI would be warranted. Can consider steroid injections if no relief after 2 weeks scheduled naproxen. Renal function good for NSAIDS. -patient to call ortho provider at [facility] for f/u -naproxen 500 q12 hours x 2 weeks -PT -rollator

## 2022-09-17 NOTE — Progress Notes (Signed)
Internal Medicine Clinic Attending  Case discussed with the resident at the time of the visit.  We reviewed the resident's history and exam and pertinent patient test results.  I agree with the assessment, diagnosis, and plan of care documented in the resident's note.  

## 2022-09-30 ENCOUNTER — Ambulatory Visit (INDEPENDENT_AMBULATORY_CARE_PROVIDER_SITE_OTHER): Payer: Self-pay | Admitting: Licensed Clinical Social Worker

## 2022-09-30 DIAGNOSIS — F322 Major depressive disorder, single episode, severe without psychotic features: Secondary | ICD-10-CM

## 2022-09-30 NOTE — BH Specialist Note (Signed)
Patient no-showed today's appointment; appointment was for Telephone visit at [facility] from here on out)  attempted patient twice via Telephone number [phone] . Mail  box is full and Nora unable to leave VM.   [facility] contacted patient from telephone number [phone].  Patient will need to reschedule appointment by calling Internal medicine center [phone].  Milus Height, MSW, [place]  Internal Medicine Center Direct [phone]  Fax [phone] Main Office Phone: [phone] [address] [place], [place] [address] Website: [place], [place]

## 2022-10-09 ENCOUNTER — Other Ambulatory Visit: Payer: Self-pay

## 2022-10-09 DIAGNOSIS — F322 Major depressive disorder, single episode, severe without psychotic features: Secondary | ICD-10-CM

## 2022-10-09 NOTE — Telephone Encounter (Signed)
Call pt to clarify Venlafaxine: "Major depression: I am going to change your venlafaxine. For the next 7 days take 150mg  daily. Then take 75 mg daily startin 09/19/22 for 7 days total. Then stop. On 09/19/22 start taking sertraline 25 mg daily."  Stated she finished taking Venlafaxine - I told pt to start sertraline; stated she understands. Reviewed her other meds with her - she stated no refills needed at this time. Informed pt to bring all her med with her at her next appt.

## 2022-10-09 NOTE — Telephone Encounter (Signed)
I called pt - no answer and mailbox is full, unable to leave a message.

## 2022-11-17 ENCOUNTER — Telehealth: Payer: Self-pay

## 2022-11-17 NOTE — Telephone Encounter (Signed)
Pt is requesting a call back .Marland Kitchen She is wanting to know what cough med is safe for her to take .Marland KitchenMarland Kitchen

## 2022-11-17 NOTE — Telephone Encounter (Signed)
Pt states she has been taking Mucinex-D; Has a dry cough. No fever. She wants to know what can she take?

## 2022-11-17 NOTE — Telephone Encounter (Signed)
Please assist Ms. Waldorf in making a f/u appointment to address new symptoms. Thank you.

## 2022-11-18 NOTE — Telephone Encounter (Signed)
Called pt to schedule f/u appt per Dr Sol Blazing. Pt stated she feels better today;"I feel great". She took Mucinex and she will get some cough drops.But will call back if this does not help. Also she has an appt Monday 5/13 - pt reminded of date/time

## 2022-11-21 ENCOUNTER — Other Ambulatory Visit: Payer: Self-pay

## 2022-11-21 DIAGNOSIS — F322 Major depressive disorder, single episode, severe without psychotic features: Secondary | ICD-10-CM

## 2022-11-24 ENCOUNTER — Other Ambulatory Visit: Payer: Self-pay

## 2022-11-24 ENCOUNTER — Other Ambulatory Visit: Payer: Self-pay | Admitting: *Deleted

## 2022-11-24 ENCOUNTER — Ambulatory Visit: Payer: Medicare HMO

## 2022-11-24 ENCOUNTER — Ambulatory Visit (INDEPENDENT_AMBULATORY_CARE_PROVIDER_SITE_OTHER): Payer: Medicare HMO | Admitting: Internal Medicine

## 2022-11-24 ENCOUNTER — Encounter: Payer: Self-pay | Admitting: Internal Medicine

## 2022-11-24 VITALS — BP 124/55 | HR 65 | Temp 98.0°F | Ht 66.0 in | Wt 209.8 lb

## 2022-11-24 DIAGNOSIS — M67814 Other specified disorders of tendon, left shoulder: Secondary | ICD-10-CM

## 2022-11-24 DIAGNOSIS — F322 Major depressive disorder, single episode, severe without psychotic features: Secondary | ICD-10-CM | POA: Diagnosis not present

## 2022-11-24 DIAGNOSIS — M25562 Pain in left knee: Secondary | ICD-10-CM | POA: Diagnosis not present

## 2022-11-24 DIAGNOSIS — E785 Hyperlipidemia, unspecified: Secondary | ICD-10-CM

## 2022-11-24 DIAGNOSIS — M25561 Pain in right knee: Secondary | ICD-10-CM

## 2022-11-24 DIAGNOSIS — I10 Essential (primary) hypertension: Secondary | ICD-10-CM

## 2022-11-24 DIAGNOSIS — G8929 Other chronic pain: Secondary | ICD-10-CM

## 2022-11-24 DIAGNOSIS — E559 Vitamin D deficiency, unspecified: Secondary | ICD-10-CM

## 2022-11-24 MED ORDER — AMLODIPINE BESYLATE 2.5 MG PO TABS
2.5000 mg | ORAL_TABLET | Freq: Every day | ORAL | 0 refills | Status: DC
Start: 2022-11-24 — End: 2023-01-09

## 2022-11-24 MED ORDER — BUPROPION HCL ER (XL) 150 MG PO TB24
150.0000 mg | ORAL_TABLET | Freq: Every day | ORAL | 0 refills | Status: DC
Start: 2022-11-24 — End: 2023-01-09

## 2022-11-24 MED ORDER — HYDROCHLOROTHIAZIDE 12.5 MG PO TABS
ORAL_TABLET | ORAL | 3 refills | Status: DC
Start: 2022-11-24 — End: 2023-11-09

## 2022-11-24 MED ORDER — GABAPENTIN 300 MG PO CAPS
ORAL_CAPSULE | ORAL | 2 refills | Status: DC
Start: 2022-11-24 — End: 2023-05-07

## 2022-11-24 MED ORDER — HYDROCHLOROTHIAZIDE 12.5 MG PO CAPS
12.5000 mg | ORAL_CAPSULE | Freq: Every day | ORAL | 0 refills | Status: DC
Start: 2022-11-24 — End: 2023-01-09

## 2022-11-24 MED ORDER — SERTRALINE HCL 50 MG PO TABS
50.0000 mg | ORAL_TABLET | Freq: Every day | ORAL | 0 refills | Status: DC
Start: 2022-11-24 — End: 2023-01-09

## 2022-11-24 MED ORDER — AMLODIPINE BESYLATE 2.5 MG PO TABS: ORAL_TABLET | ORAL | 3 refills | Status: DC

## 2022-11-24 MED ORDER — GABAPENTIN 300 MG PO CAPS
ORAL_CAPSULE | ORAL | 0 refills | Status: DC
Start: 2022-11-24 — End: 2023-01-09

## 2022-11-24 MED ORDER — ATORVASTATIN CALCIUM 20 MG PO TABS
20.0000 mg | ORAL_TABLET | Freq: Every day | ORAL | 0 refills | Status: DC
Start: 2022-11-24 — End: 2023-01-09

## 2022-11-24 MED ORDER — SERTRALINE HCL 50 MG PO TABS
50.0000 mg | ORAL_TABLET | Freq: Every day | ORAL | 3 refills | Status: DC
Start: 2022-11-24 — End: 2023-02-10

## 2022-11-24 MED ORDER — BUPROPION HCL ER (XL) 150 MG PO TB24
150.0000 mg | ORAL_TABLET | ORAL | 3 refills | Status: DC
Start: 2022-11-24 — End: 2024-01-26

## 2022-11-24 MED ORDER — ATORVASTATIN CALCIUM 20 MG PO TABS
20.0000 mg | ORAL_TABLET | Freq: Every day | ORAL | 3 refills | Status: DC
Start: 2022-11-24 — End: 2022-11-25
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
## 2022-11-24 NOTE — Patient Instructions (Addendum)
It was wonderful to see you today!  For knee and shoulder pain: -You may take Tylenol 1000 mg every 8 hours. Please continue to use Voltaren gel up to 4 times each day. -For the next week, try ibuprofen 400 mg every 6 hours for severe pain that is not managed by the above. Please take this medication with food. -I have sent a referral to physical therapy and sports medicine for further evaluation. Make sure to answer your phone to get scheduled, even with unknown numbers!  For depression: -Increased sertraline to 50 mg daily -Continue bupropion 150 mg daily -I have sent a referral to our counselor, Christen Butter.  All of your medications have been sent to [facility] pharmacy.  I will call you with lab results later this week.

## 2022-11-25 ENCOUNTER — Telehealth: Payer: Self-pay | Admitting: Internal Medicine

## 2022-11-25 ENCOUNTER — Telehealth: Payer: Self-pay

## 2022-11-25 DIAGNOSIS — G8929 Other chronic pain: Secondary | ICD-10-CM | POA: Insufficient documentation

## 2022-11-25 DIAGNOSIS — R7989 Other specified abnormal findings of blood chemistry: Secondary | ICD-10-CM

## 2022-11-25 LAB — VITAMIN D 25 HYDROXY (VIT D DEFICIENCY, FRACTURES): Vit D, 25-Hydroxy: 16.4 ng/mL — ABNORMAL LOW (ref 30.0–100.0)

## 2022-11-25 LAB — BMP8+ANION GAP
Anion Gap: 13 mmol/L (ref 10.0–18.0)
BUN/Creatinine Ratio: 9 — ABNORMAL LOW (ref 12–28)
BUN: 11 mg/dL (ref 8–27)
CO2: 25 mmol/L (ref 20–29)
Calcium: 9.9 mg/dL (ref 8.7–10.3)
Chloride: 103 mmol/L (ref 96–106)
Creatinine, Ser: 1.21 mg/dL — ABNORMAL HIGH (ref 0.57–1.00)
Glucose: 96 mg/dL (ref 70–99)
Potassium: 4.3 mmol/L (ref 3.5–5.2)
Sodium: 141 mmol/L (ref 134–144)
eGFR: 46 mL/min/{1.73_m2} — ABNORMAL LOW (ref >59)

## 2022-11-25 LAB — LIPID PANEL
Chol/HDL Ratio: 3.9 ratio (ref 0.0–4.4)
Cholesterol, Total: 186 mg/dL (ref 100–199)
HDL: 48 mg/dL (ref >39)
LDL Chol Calc (NIH): 121 mg/dL — ABNORMAL HIGH (ref 0–99)
Triglycerides: 93 mg/dL (ref 0–149)
VLDL Cholesterol Cal: 17 mg/dL (ref 5–40)

## 2022-11-25 MED ORDER — ATORVASTATIN CALCIUM 40 MG PO TABS
40.0000 mg | ORAL_TABLET | Freq: Every day | ORAL | 3 refills | Status: DC
Start: 2022-11-25 — End: 2024-01-26

## 2022-11-25 MED ORDER — VITAMIN D (CHOLECALCIFEROL) 25 MCG (1000 UT) PO CAPS
1.0000 | ORAL_CAPSULE | Freq: Every day | ORAL | 3 refills | Status: DC
Start: 2022-11-25 — End: 2023-10-14

## 2022-11-25 NOTE — Telephone Encounter (Signed)
Requesting to speak with Dr. Sol Blazing for lab results. Please call pt back.

## 2022-11-25 NOTE — Progress Notes (Signed)
Elevated Cr/reduced eGFR from last check 6 months ago. Discussed with Ms. Peterman. Only drinking about 32 oz of water/day with hctz use. Discussed increased fluid intake and return early next week for repeat BMP. Avoid NSAIDs for now for pain control, reviewed this change in plan from yesterday's visit.

## 2022-11-25 NOTE — Addendum Note (Signed)
Addended by: Dickie La on: 11/25/2022 11:49 AM   Modules accepted: Orders

## 2022-11-25 NOTE — Telephone Encounter (Signed)
Called to review lab results, no answer. Voicemail left to call [facility]. Will need to discuss increased statin, starting vitamin D, and increasing fluids and avoiding NSAIDs in the setting of elevated Cr/reduced eGFR. Would like return lab visit early next week to check kidney function, order placed.

## 2022-11-25 NOTE — Assessment & Plan Note (Signed)
She has been out of vitamin D supplements for some time. Last checked in 2021 and noted to be deficient.  Plan -Recheck vit D today

## 2022-11-25 NOTE — Assessment & Plan Note (Addendum)
Continued left shoulder pain, exacerbated following a prior fall several months ago. Previously prescribed PT and DME walker but these have not been completed and encouraged to f/u with prior orthopedics provider which she did not do. Continues to take 500 mg Tylenol as needed and uses Voltaren gel. Exam notable for limited ROM in abduction and full forward flexion, as well as internal rotation, appears secondary to pain. Strength is 5/5 to resisted forward flexion and internal/external rotation at 90 degrees. Positive empty can and Hawkins test. Does not seem consistent with full thickness tear, although certainly positive for impingement. We discussed treatment with PT, acetaminophen, and short course of NSAIDs. Referral placed to sports medicine for consideration of injections if patient is agreeable.  Plan -Tylenol 1000 mg q8h and topical diclofenac 4x/day, ibuprofen 400 mg q6h prn for severe pain -Referral to sports medicine -New referral placed to PT

## 2022-11-25 NOTE — Progress Notes (Signed)
Vitamin D low off supplementation. Resume 1000 IU daily.

## 2022-11-25 NOTE — Assessment & Plan Note (Signed)
Bilateral knee pain. Chronic left knee pain exacerbated by a fall several months ago, XR without fracture but evidence of moderate OA. Given impaired mobility and left knee pain, she has been compensating with her right knee which is also now experiencing pain. Diffuse TTP over bilateral medial/lateral joint lines. No significant effusion. Unable to fully assess for meniscal tear given positive pain signals with all movement and unable to complete Thessaly due to instability. No laxity noted. Concern for OA causing chronic knee pain. Referral to sports medicine for consideration of US/further imaging and/or injections. Will need new referral to PT and will f/u on walker order placed at previous visit.   Plan -Tylenol 1000 mg q8h and topical diclofenac 4x/day, ibuprofen 400 mg q6h prn for severe pain -Referral to sports medicine -New referral placed to PT -Message sent to referral coordinator regarding DME

## 2022-11-25 NOTE — Assessment & Plan Note (Addendum)
Reports adherence to atorvastatin 20 mg daily for primary prevention. Needs new lipid panel.  Plan -Lipid panel

## 2022-11-25 NOTE — Assessment & Plan Note (Signed)
Chronic and stable. Well managed on current regimen. No changes today.  Plan -Continue amlodipine 2.5 mg and hctz 12.5 mg daily -BMP

## 2022-11-25 NOTE — Assessment & Plan Note (Signed)
Ongoing poor mood and depression. Significant fatigue, low energy, and disinterest. Denies SI. At last visit, medications were transitioned from venlafaxine to sertraline. PHQ9 20. GAD 7 12. She has significant life and family stressors at this time. She is interested in behavioral health referral for counseling.   Plan -Increase sertraline to 50 mg daily, return in 4 weeks -Continue buproprion 150 mg daily -Referral to [facility], Christen Butter

## 2022-11-26 ENCOUNTER — Other Ambulatory Visit: Payer: Self-pay | Admitting: Internal Medicine

## 2022-11-26 DIAGNOSIS — G8929 Other chronic pain: Secondary | ICD-10-CM

## 2022-11-26 DIAGNOSIS — M67814 Other specified disorders of tendon, left shoulder: Secondary | ICD-10-CM

## 2022-12-03 NOTE — Progress Notes (Unsigned)
Rubin Payor, PhD, LAT, ATC acting as a scribe for Clementeen Graham, MD.  Sarah [name] is a 77 y.o. female who presents to [facility] at [facility] today for bilat knees and L shoulder pain.  Pt reports bilat knee pain, R>L, x about 2 months after suffering a fall. Pt locates pain to all over both knee joint. She c/o being so severe that it feels like it's going to give out.  Low back pain: yes- slight Knee swelling: yes- intermittently Mechanical symptoms: yes Aggravates: walking Treatments tried: Tylenol, gabapentin  She also c/o L shoulder pain that is also a result of the fall. She notes limited AROM. Pain is located all over the shoulder joint.  Neck pain: slightly into L trapz Radiates: yes- into upper arm Aggravates: shoulder aBd  She rates her pain overall as severe and limiting.  She would like to avoid surgery if possible.  Dx imaging: 07/15/22 L humerus XR 05/14/22 L knee & L shoulder XR  04/15/21 L shoulder MRI    Pertinent review of systems: No fevers or chills  Relevant historical information: Hypertension.  History of knee fracture requiring surgery.   Exam:  BP (!) 150/78   Pulse 73   Ht 5\' 6"  (1.676 m)   Wt 212 lb (96.2 kg)   SpO2 97%   BMI 34.22 kg/m  General: Well Developed, well nourished, and in no acute distress.   MSK: Left shoulder normal-appearing Nontender. Normal motion pain with abduction.  Strength decreased abduction otherwise intact. Hawkins and Neer's test positive.  Knees bilaterally mild effusion.   Decreased range of motion with crepitation.  Diffusely tender.    Lab and Radiology Results  Procedure: Real-time Ultrasound Guided Injection of left knee joint superior lateral patellar space Device: Philips Affiniti 50G Images permanently stored and available for review in PACS Verbal informed consent obtained.  Discussed risks and benefits of procedure. Warned about infection, bleeding, hyperglycemia damage  to structures among others. Patient expresses understanding and agreement Time-out conducted.   Noted no overlying erythema, induration, or other signs of local infection.   Skin prepped in a sterile fashion.   Local anesthesia: Topical Ethyl chloride.   With sterile technique and under real time ultrasound guidance: 40 mg of Kenalog and 2 mL Marcaine injected into knee joint. Fluid seen entering the joint capsule.   When I first inserted the needle to proceed with the injection she jerked quite a bit and I withdrew the needle suddenly. After discussion we agreed to try again.  I replaced the needle and resterilized the skin and then reapplied cold spray.  This time the injection went smoothly Completed without difficulty   Pain moderately resolved suggesting accurate placement of the medication.   Advised to call if fevers/chills, erythema, induration, drainage, or persistent bleeding.   Images permanently stored and available for review in the ultrasound unit.  Impression: Technically successful ultrasound guided injection.    Diagnostic Limited MSK Ultrasound of: Left shoulder Biceps tendon intact normal-appearing Subscapularis tendon appears to be intact Supraspinatus tendon not present suspect full-thickness retracted rotator cuff tear. Infraspinatus tendon appears normal appearing AC joint degenerative appearing Impression: Concern full-thickness supraspinatus rotator cuff tear with retraction.   X-ray images left shoulder and bilateral knees obtained today personally independently interpreted.  Left shoulder: AC DJD present.  Mild glenohumeral DJD.  No acute fractures are visible.  Humeral head is somewhat high riding  Right knee: Moderate to severe tricompartmental DJD.  No acute  fractures are visible.  Left knee: Status post IM nail tibia with unchanged appearance of surgical hardware.  Moderate degenerative changes present in all 3 compartments.  No acute fractures are  present.  Await formal radiology review  Assessment and Plan: 77 y.o. female with acute worsening of chronic left shoulder pain after a fall occurring about 2 months ago.  Based on ultrasound and physical exam appearance I am very concerned for a supraspinatus full-thickness rotator cuff tear.  We discussed options.  She would like to avoid surgery.  Plan for trial of physical therapy to work on rotator cuff range of motion and improved strength is much as possible.  May benefit from a glenohumeral injection in the near future.  Based on her response to injection below we are going to wait a week and try some lorazepam before we try another shot.  Bilateral knee pain thought to be due to exacerbation of DJD.  Proceeded with 1 injection in the left knee today.  She had quite a bit of anxiety and jerked enough that I had to remove the needle and try again which caused more pain.  After discussion we abandon any future injection plans today and I prescribed lorazepam so that she can take it about an hour before her visit in a week and feel less anxious for the shot.  Anticipate bilateral knee injections and physical therapy.   PDMP reviewed during this encounter. Orders Placed This Encounter  Procedures   [place] LIMITED JOINT SPACE STRUCTURES LOW BILAT(NO LINKED CHARGES)    Order Specific Question:   Reason for Exam (SYMPTOM  OR DIAGNOSIS REQUIRED)    Answer:   bilateral knee pain    Order Specific Question:   Preferred imaging location?    Answer:   [facility] Knee AP/LAT W/Sunrise Left    Standing Status:   Future    Number of Occurrences:   1    Standing Expiration Date:   01/04/2023    Order Specific Question:   Reason for Exam (SYMPTOM  OR DIAGNOSIS REQUIRED)    Answer:   bilateral knee pain    Order Specific Question:   Preferred imaging location?    Answer:   [facility] Knee AP/LAT W/Sunrise Right    Standing Status:   Future    Number of  Occurrences:   1    Standing Expiration Date:   01/04/2023    Order Specific Question:   Reason for Exam (SYMPTOM  OR DIAGNOSIS REQUIRED)    Answer:   bilateral knee pain    Order Specific Question:   Preferred imaging location?    Answer:   [facility] Shoulder Left    Standing Status:   Future    Number of Occurrences:   1    Standing Expiration Date:   01/04/2023    Order Specific Question:   Reason for Exam (SYMPTOM  OR DIAGNOSIS REQUIRED)    Answer:   left shoulder pain    Order Specific Question:   Preferred imaging location?    Answer:   [facility]   Ambulatory referral to Physical Therapy    Referral Priority:   Routine    Referral Type:   Physical Medicine    Referral Reason:   Specialty Services Required    Requested Specialty:   Physical Therapy    Number of Visits Requested:   1   Meds ordered this  encounter  Medications   LORazepam (ATIVAN) 0.5 MG tablet    Sig: 1-2 tabs 30 - 60 min prior to injetion. Do not drive with this medicine.    Dispense:  4 tablet    Refill:  0     Discussed warning signs or symptoms. Please see discharge instructions. Patient expresses understanding.   The above documentation has been reviewed and is accurate and complete Clementeen Graham, M.D.

## 2022-12-04 ENCOUNTER — Other Ambulatory Visit: Payer: Self-pay

## 2022-12-04 ENCOUNTER — Ambulatory Visit: Payer: Medicare HMO | Admitting: Family Medicine

## 2022-12-04 ENCOUNTER — Telehealth: Payer: Self-pay | Admitting: Internal Medicine

## 2022-12-04 ENCOUNTER — Ambulatory Visit (INDEPENDENT_AMBULATORY_CARE_PROVIDER_SITE_OTHER): Payer: Medicare HMO

## 2022-12-04 VITALS — BP 150/78 | HR 73 | Ht 66.0 in | Wt 212.0 lb

## 2022-12-04 DIAGNOSIS — M25562 Pain in left knee: Secondary | ICD-10-CM

## 2022-12-04 DIAGNOSIS — G8929 Other chronic pain: Secondary | ICD-10-CM

## 2022-12-04 DIAGNOSIS — M25512 Pain in left shoulder: Secondary | ICD-10-CM

## 2022-12-04 DIAGNOSIS — M25561 Pain in right knee: Secondary | ICD-10-CM

## 2022-12-04 DIAGNOSIS — M1711 Unilateral primary osteoarthritis, right knee: Secondary | ICD-10-CM | POA: Diagnosis not present

## 2022-12-04 DIAGNOSIS — M19012 Primary osteoarthritis, left shoulder: Secondary | ICD-10-CM | POA: Diagnosis not present

## 2022-12-04 DIAGNOSIS — M1712 Unilateral primary osteoarthritis, left knee: Secondary | ICD-10-CM | POA: Diagnosis not present

## 2022-12-04 MED ORDER — LORAZEPAM 0.5 MG PO TABS: ORAL_TABLET | ORAL | 0 refills | Status: DC

## 2022-12-04 NOTE — Telephone Encounter (Signed)
Called to f/u on plan for repeat lab results. No visits from Ms. Bocock earlier this week. No answer, voicemail left to return my call.

## 2022-12-04 NOTE — Telephone Encounter (Signed)
Ms. Write will plan to come in for labs only visit tomorrow between [phone].

## 2022-12-04 NOTE — Patient Instructions (Addendum)
Thank you for coming in today.   You received an injection today. Seek immediate medical attention if the joint becomes red, extremely painful, or is oozing fluid.   I've referred you to Physical Therapy.  Let us know if you don't hear from them in one week.   Check back in 1 week

## 2022-12-04 NOTE — Telephone Encounter (Signed)
Requesting lab results, please call back.  

## 2022-12-05 ENCOUNTER — Telehealth: Payer: Self-pay

## 2022-12-05 NOTE — Progress Notes (Signed)
Right knee x-ray shows arthritis changes.

## 2022-12-05 NOTE — Progress Notes (Signed)
Left shoulder x-ray shows arthritis in the main shoulder joint

## 2022-12-05 NOTE — Telephone Encounter (Signed)
Sarah [name] with [facility] requesting to speak with a nurse about atorvastatin (LIPITOR) 20 MG tablet and atorvastatin (LIPITOR) 40 MG table. Want to clarified  which one do the patient need. Please call back.

## 2022-12-05 NOTE — Progress Notes (Signed)
Left knee x-ray shows arthritis changes throughout the knee.  The hardware where you had surgery looks okay.

## 2022-12-09 ENCOUNTER — Telehealth: Payer: Self-pay | Admitting: Internal Medicine

## 2022-12-09 NOTE — Telephone Encounter (Signed)
Called to Sarah [name] again to discuss the need for repeat labs. She states she will come in today.

## 2022-12-10 ENCOUNTER — Other Ambulatory Visit: Payer: Medicare HMO

## 2022-12-10 DIAGNOSIS — R7989 Other specified abnormal findings of blood chemistry: Secondary | ICD-10-CM

## 2022-12-11 ENCOUNTER — Ambulatory Visit: Payer: Medicare HMO | Admitting: Family Medicine

## 2022-12-11 NOTE — Progress Notes (Deleted)
   Rubin Payor, PhD, LAT, ATC acting as a scribe for Clementeen Graham, MD.  Sarah [name] is a 77 y.o. female who presents to [facility] at [facility] today for 1-wk f/u bilat knee and L shoulder pain. Pt was last seen by Dr. Denyse Amass on 12/04/22 and was given a L knee steroid injection and was prescribed lorazepam to take prior to f/u visit to attempt steroid injection. She was also referred to PT, but has not yet scheduled any visits.   Today, pt reports ***  Dx imaging: 12/04/22 L shoulder and R & L knee XR 07/15/22 L humerus XR 05/14/22 L knee & L shoulder XR             04/15/21 L shoulder MRI  Pertinent review of systems: ***  Relevant historical information: ***   Exam:  There were no vitals taken for this visit. General: Well Developed, well nourished, and in no acute distress.   MSK: ***    Lab and Radiology Results No results found for this or any previous visit (from the past 72 hour(s)). No results found.     Assessment and Plan: 77 y.o. female with ***   PDMP not reviewed this encounter. No orders of the defined types were placed in this encounter.  No orders of the defined types were placed in this encounter.    Discussed warning signs or symptoms. Please see discharge instructions. Patient expresses understanding.   ***

## 2022-12-12 LAB — BMP8+ANION GAP
Anion Gap: 14 mmol/L (ref 10.0–18.0)
BUN/Creatinine Ratio: 12 (ref 12–28)
BUN: 15 mg/dL (ref 8–27)
CO2: 25 mmol/L (ref 20–29)
Calcium: 9.1 mg/dL (ref 8.7–10.3)
Chloride: 104 mmol/L (ref 96–106)
Creatinine, Ser: 1.29 mg/dL — ABNORMAL HIGH (ref 0.57–1.00)
Glucose: 85 mg/dL (ref 70–99)
Potassium: 3.9 mmol/L (ref 3.5–5.2)
Sodium: 143 mmol/L (ref 134–144)
eGFR: 43 mL/min/{1.73_m2} — ABNORMAL LOW (ref >59)

## 2022-12-22 NOTE — Progress Notes (Signed)
Continued elevation in Cr. Reviewed with Ms. Trulson. She has been increasing her fluid intake but has been taking NSAIDs since last check. We reviewed the need to avoid these medications for now and will plan to recheck after stopping.

## 2022-12-25 ENCOUNTER — Encounter: Payer: Self-pay | Admitting: Gastroenterology

## 2022-12-25 ENCOUNTER — Telehealth: Payer: Self-pay | Admitting: Internal Medicine

## 2022-12-25 NOTE — Telephone Encounter (Signed)
-----   Message from Dickie La, MD sent at 12/22/2022 11:56 AM EDT ----- Regarding: Labs only visit Good morning. Can you please assist Ms. Ponds in making a labs only visit in the next week? She is aware you will be calling. Thank you.  Dr. Sol Blazing

## 2022-12-25 NOTE — Telephone Encounter (Signed)
Please refer to message below.  Attempted to contact patient via telephone at the request of Dr. Dickie La, but no answer.  Left detailed message asking patient to give the clinic a call back to schedule a lab appointment only at her earliest convenience.

## 2022-12-30 ENCOUNTER — Other Ambulatory Visit (INDEPENDENT_AMBULATORY_CARE_PROVIDER_SITE_OTHER): Payer: Medicare HMO

## 2022-12-30 ENCOUNTER — Ambulatory Visit (INDEPENDENT_AMBULATORY_CARE_PROVIDER_SITE_OTHER): Payer: Self-pay | Admitting: Licensed Clinical Social Worker

## 2022-12-30 ENCOUNTER — Other Ambulatory Visit: Payer: Self-pay | Admitting: Internal Medicine

## 2022-12-30 DIAGNOSIS — I10 Essential (primary) hypertension: Secondary | ICD-10-CM

## 2022-12-30 DIAGNOSIS — R7989 Other specified abnormal findings of blood chemistry: Secondary | ICD-10-CM

## 2022-12-30 NOTE — BH Specialist Note (Signed)
Patient no-showed today's appointment; appointment was for Telephone visit at 10:00am  Behavioral Health Clinician [facility] from here on out)  attempted patient  via Telephone number [phone]    [facility] contacted patient from telephone number [phone]. [facility] left a  VM.   Patient will need to reschedule appointment by calling Internal medicine center [phone].  Christen Butter, MSW, LCSW-A She/Her Behavioral Health Clinician [facility]  Internal Medicine Center Direct [phone]  Fax [phone] Main Office Phone: [phone] [address] [place] [place], [place] [address] Website: [facility] Internal Medicine [facility]  [place], [place]  [place]

## 2022-12-31 LAB — BMP8+ANION GAP
Anion Gap: 16 mmol/L (ref 10.0–18.0)
BUN/Creatinine Ratio: 11 — ABNORMAL LOW (ref 12–28)
BUN: 12 mg/dL (ref 8–27)
CO2: 25 mmol/L (ref 20–29)
Calcium: 9.2 mg/dL (ref 8.7–10.3)
Chloride: 101 mmol/L (ref 96–106)
Creatinine, Ser: 1.1 mg/dL — ABNORMAL HIGH (ref 0.57–1.00)
Glucose: 91 mg/dL (ref 70–99)
Potassium: 3.6 mmol/L (ref 3.5–5.2)
Sodium: 142 mmol/L (ref 134–144)
eGFR: 52 mL/min/{1.73_m2} — ABNORMAL LOW (ref >59)

## 2022-12-31 NOTE — Progress Notes (Signed)
Improvement in Cr/eGFR with increased fluid intake, avoidance of NSAIDs. Reviewed with Ms. Espeland. F/u in one month at recommended clinic appointment.

## 2023-01-09 ENCOUNTER — Telehealth: Payer: Self-pay | Admitting: Internal Medicine

## 2023-01-09 MED ORDER — BUSPIRONE HCL 5 MG PO TABS: 5.0000 mg | ORAL_TABLET | Freq: Three times a day (TID) | ORAL | 0 refills | Status: DC | PRN

## 2023-01-09 MED ORDER — ALPRAZOLAM 0.25 MG PO TABS: 0.2500 mg | ORAL_TABLET | Freq: Two times a day (BID) | ORAL | 0 refills | Status: DC | PRN

## 2023-01-09 NOTE — Addendum Note (Signed)
Addended by: Dickie La on: 01/09/2023 12:21 PM   Modules accepted: Orders

## 2023-01-09 NOTE — Telephone Encounter (Addendum)
Ms. Martorella called the clinic from the hospital to relay she is experiencing significant acute anxiety and distress regarding the sudden illness, hospitalization, and impending death of her ex-husband, the father of her children. He had a sudden illness and support will be discontinued today. She continues to take sertraline and bupropion as discussed at recent visit.   Addendum: Initially discussed a short-course of alprazolam prn for acute symptoms, however will instead trial buspirone 5 mg tid prn for anxiety symptoms. Pharmacy and patient notified of this change. We will need to follow-up in clinic within the next 1-2 weeks to address mood and further management. We reviewed safety and side effects for which to monitor. Message sent to front desk to schedule f/u appointment.

## 2023-01-12 ENCOUNTER — Institutional Professional Consult (permissible substitution): Payer: Self-pay | Admitting: Licensed Clinical Social Worker

## 2023-01-12 ENCOUNTER — Ambulatory Visit (INDEPENDENT_AMBULATORY_CARE_PROVIDER_SITE_OTHER): Payer: Medicare HMO | Admitting: Licensed Clinical Social Worker

## 2023-01-12 DIAGNOSIS — F322 Major depressive disorder, single episode, severe without psychotic features: Secondary | ICD-10-CM

## 2023-01-12 NOTE — BH Specialist Note (Signed)
Patient no-showed today's appointment; appointment was for Telephone visit at 1:30Pm   [facility] contacted patient from telephone number [phone]. [facility] left a  VM.   Patient will need to reschedule appointment by calling Internal medicine center [phone].  Christen Butter, MSW, LCSW-A She/Her Behavioral Health Clinician [facility]  Internal Medicine Center Direct Dial:[phone]  Fax [phone] Main Office Phone: [phone] [address] [place] [place], [place] [address] Website: [facility] Internal Medicine [facility]  [place], [place]  [place]

## 2023-01-13 ENCOUNTER — Telehealth: Payer: Self-pay | Admitting: Internal Medicine

## 2023-01-13 NOTE — Telephone Encounter (Signed)
Patient did return my phone call. She is currently out of town due to an emergency and unable to come in for an appointment next week.

## 2023-01-13 NOTE — Telephone Encounter (Signed)
-----   Message from Dickie La, MD sent at 01/09/2023 10:58 AM EDT ----- Regarding: Urgent f/u appointment Good morning. Can you please schedule [name] for a f/u with me on 7/9 (okay to overbook) or with the residents that same week if she can't make this date? Follow-up for anxiety/depression.   Thank you, Dickie La

## 2023-01-13 NOTE — Telephone Encounter (Signed)
Please refer to message below.  Attempted to contact patient via telephone at the request of Dr. Dickie La to schedule an appointment on 01/20/2023.  Patient did not answer the phone, but I was able to leave detailed message asking to call the clinic back.

## 2023-02-02 ENCOUNTER — Other Ambulatory Visit: Payer: Self-pay

## 2023-02-02 MED ORDER — BUSPIRONE HCL 5 MG PO TABS: 5.0000 mg | ORAL_TABLET | Freq: Three times a day (TID) | ORAL | 0 refills | Status: DC | PRN

## 2023-02-02 NOTE — Telephone Encounter (Signed)
Called pt - stated she needs a refill on Buspar. Pt has an appt 7/30 with Dr Sol Blazing. She will like enough med until her appt.

## 2023-02-02 NOTE — Telephone Encounter (Signed)
Requesting to speak with a nurse about getting medication refill for anxiety and depression. Please call pt back.

## 2023-02-10 ENCOUNTER — Other Ambulatory Visit: Payer: Self-pay

## 2023-02-10 ENCOUNTER — Ambulatory Visit: Payer: Medicare HMO | Admitting: Internal Medicine

## 2023-02-10 ENCOUNTER — Encounter: Payer: Self-pay | Admitting: Internal Medicine

## 2023-02-10 ENCOUNTER — Ambulatory Visit (HOSPITAL_COMMUNITY)
Admission: RE | Admit: 2023-02-10 | Discharge: 2023-02-10 | Disposition: A | Discharge status: Home / Self Care | Payer: Medicare HMO | Source: Ambulatory Visit | Attending: Internal Medicine | Admitting: Internal Medicine

## 2023-02-10 VITALS — BP 128/65 | HR 62 | Temp 98.5°F | Ht 66.0 in | Wt 213.9 lb

## 2023-02-10 DIAGNOSIS — I499 Cardiac arrhythmia, unspecified: Secondary | ICD-10-CM

## 2023-02-10 DIAGNOSIS — F322 Major depressive disorder, single episode, severe without psychotic features: Secondary | ICD-10-CM

## 2023-02-10 DIAGNOSIS — R7989 Other specified abnormal findings of blood chemistry: Secondary | ICD-10-CM | POA: Diagnosis not present

## 2023-02-10 DIAGNOSIS — E785 Hyperlipidemia, unspecified: Secondary | ICD-10-CM

## 2023-02-10 DIAGNOSIS — F332 Major depressive disorder, recurrent severe without psychotic features: Secondary | ICD-10-CM

## 2023-02-10 DIAGNOSIS — R0602 Shortness of breath: Secondary | ICD-10-CM | POA: Insufficient documentation

## 2023-02-10 DIAGNOSIS — I491 Atrial premature depolarization: Secondary | ICD-10-CM | POA: Insufficient documentation

## 2023-02-10 DIAGNOSIS — I10 Essential (primary) hypertension: Secondary | ICD-10-CM | POA: Diagnosis not present

## 2023-02-10 MED ORDER — BUSPIRONE HCL 5 MG PO TABS
10.0000 mg | ORAL_TABLET | Freq: Three times a day (TID) | ORAL | 0 refills | Status: DC | PRN
Start: 2023-02-10 — End: 2023-03-27

## 2023-02-10 MED ORDER — BUSPIRONE HCL 5 MG PO TABS
10.0000 mg | ORAL_TABLET | Freq: Three times a day (TID) | ORAL | 0 refills | Status: DC | PRN
Start: 2023-02-10 — End: 2023-02-10

## 2023-02-10 MED ORDER — SERTRALINE HCL 100 MG PO TABS
100.0000 mg | ORAL_TABLET | Freq: Every day | ORAL | 3 refills | Status: DC
Start: 2023-02-10 — End: 2023-05-25

## 2023-02-10 NOTE — Assessment & Plan Note (Addendum)
Sarah [name] has a hx of anxiety and stress which has worsened since the unexpected death of her husband. She was emotional and crying the whole visit and requested to be seen by a counselor. She lives alone and feels like she does not have any support. She knows she is not crazy and she is just going through a lot. She has 4 kids and feels like they are all separated and not around. She mentions she does not have any support. She goes to church but feels like God is the only support system she has. She has decreased appetite, has not been able to eat or sleep properly and has occasional SOB. She takes all of her medications including sertraline, buproprion, and busprion. She needed refills for busprion and found the meds prescribed were somewhat helpful. She denied thoughts of self harm and thought of not wanting to live. I spoke to her about trying to eat something little even when she has no appetite and distracting herself by doing chores around the house. Previous clinic visit, she was referred to behavioral specialist in the clinic however there was miscommunication and she missed the appointment.  Her PHQ 9 score today was 9 and GAD 7 score was 8 similar to previous visit.  Plan: Provide another referral to the behavior specialist in clinic. Increase dose of Sertraline to 100 mg Increase the dose of Buspar - two 5 mg tablets three times a day Continue buproprion 150 mg once a day
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
## 2023-02-10 NOTE — Assessment & Plan Note (Addendum)
She takes amlodipine 2.5 mg and hydrochlorothiazide 12.5 mg for hypertension. She denied any blurry vision, headaches, chest pain, or fatigue. She is due for a BMP. Creatinine was elevated from normal baseline at last visit, improving on f/u lab work. Her blood pressure today is 128/65.  A/P - Blood pressure is well controlled. No change in bp medication. Continue amlodipine 2.5 mg and hydrochlorothiazide 12.5 mg daily. - BMP

## 2023-02-10 NOTE — Assessment & Plan Note (Signed)
She takes atorvastatin 40 mg  daily. She is due for a lipid profile.  - No change in statins.  - Check lipid profile.

## 2023-02-10 NOTE — Patient Instructions (Signed)
Thank you, Ms. [name] for allowing [place] to provide your care today and for coming in. We are sorry you are going through so much and it has been difficult for you. We discussed a few things today.  Stress  You can increase your dose of Sertraline to 100 mg  You can increase the dose of Buspar by taking two 5 mg tablets three times a day  Continue buproprion 150 mg once a day Therapist from our clinic will reach out to you to set up an appointment. If they do not call you, please call our clinic.  Shortness of breath - we did an EKG today and did not see a heart rhythm that would cause your symptoms. Please monitor these episodes and let me know if they are happening more frequently or if you have a change in symptoms. Labs  - we are checking your kidney function, lipids and urine today. We will call you once the results are back.   I have ordered the following labs for you:  Lab Orders         BMP8+Anion Gap         Lipid Profile         Urinalysis, Reflex Microscopic        I have ordered the following medication/changed the following medications:   Stop the following medications: Medications Discontinued During This Encounter  Medication Reason   sertraline (ZOLOFT) 50 MG tablet Reorder   busPIRone (BUSPAR) 5 MG tablet    busPIRone (BUSPAR) 5 MG tablet      Start the following medications: Meds ordered this encounter  Medications   sertraline (ZOLOFT) 100 MG tablet    Sig: Take 1 tablet (100 mg total) by mouth daily.    Dispense:  90 tablet    Refill:  3   DISCONTD: busPIRone (BUSPAR) 5 MG tablet    Sig: Take 2 tablets (10 mg total) by mouth 3 (three) times daily as needed.    Dispense:  30 tablet    Refill:  0   busPIRone (BUSPAR) 5 MG tablet    Sig: Take 2 tablets (10 mg total) by mouth 3 (three) times daily as needed.    Dispense:  60 tablet    Refill:  0     Follow up:  2 weeks    Remember:   We look forward to seeing you next time. Please call our clinic  at [phone] if you have any questions or concerns. The best time to call is Monday-Friday from 9am-4pm, but there is someone available 24/7. If after hours or the weekend, call the main hospital number and ask for the Internal Medicine Resident On-Call. If you need medication refills, please notify your pharmacy one week in advance and they will send [place] a request.   Thank you for trusting me with your care. Wishing you the best!   [facility] Internal Medicine Center

## 2023-02-10 NOTE — Assessment & Plan Note (Addendum)
Sarah [name] complained of having shortness of breath 2-3 times a day. She describes it as having to catch her breath. She does not have shoulder pain, numbness, paresthesias, cough or fever. On physical exam she had irregular heart rhythm and lungs were CTAB. An EKG was obtained in clinic today which showed regular rate and rhythm.  At this moment, not concerned for any arrhythmias or abnormalities, however have asked her to call clinic if her sx worsen, or of the episodes occur more frequently.  Plan - closely monitor sx

## 2023-02-10 NOTE — Progress Notes (Unsigned)
   Established Patient Office Visit  Subjective   Patient ID: Sarah [name], female    DOB: 10-26-1945  Age: 77 y.o. MRN: [id number]  Chief Complaint  Patient presents with  . Follow-up Visit    HPI  {History (Optional):23778}  ROS    Objective:     BP 128/65 (BP Location: Right Arm, Patient Position: Sitting, Cuff Size: Normal)   Pulse 62   Temp 98.5 F (36.9 C) (Oral)   Ht 5\' 6"  (1.676 m)   Wt 213 lb 14.4 oz (97 kg)   SpO2 100% Comment: RA  BMI 34.52 kg/m  {Vitals History (Optional):23777}  Physical Exam   No results found for any visits on 02/10/23.  {Labs (Optional):23779}  The 10-year ASCVD risk score (Arnett DK, et al., 2019) is: 14.2%    Assessment & Plan:   Problem List Items Addressed This Visit       Cardiovascular and Mediastinum   Essential hypertension, benign - Primary (Chronic)   Relevant Orders   BMP8+Anion Gap     Other   Hyperlipidemia   Relevant Orders   Lipid Profile   Other Visit Diagnoses     Elevated serum creatinine       Relevant Orders   BMP8+Anion Gap   Urinalysis, Reflex Microscopic   Irregular heart rhythm       Relevant Orders   EKG 12-Lead (Completed)   Current severe episode of major depressive disorder without psychotic features without prior episode (HCC)       Relevant Medications   sertraline (ZOLOFT) 100 MG tablet   busPIRone (BUSPAR) 5 MG tablet       Return in about 2 weeks (around 02/24/2023) for fu mood.    Dickie La, MD

## 2023-02-11 NOTE — Progress Notes (Signed)
Attestation for Student Documentation:  I personally was present and re-performed the history, physical exam and medical decision-making activities of this service and have verified that the service and findings are accurately documented in the student's note.  Dickie La, MD 02/11/2023, 1:00 PM

## 2023-02-12 NOTE — Progress Notes (Signed)
Improved LDL to <100 with atorvastatin 40 mg daily. Will continue at current dose. Reviewed with Sarah [name] 02/12/23.

## 2023-02-12 NOTE — Progress Notes (Signed)
UA with WBC and bacteria, no protein, RBC, or casts. Sarah [name] is having no symptoms of a urinary tract infection, therefore no treatment is indicated. Reviewed with Ms. Patrone 02/12/23.

## 2023-02-12 NOTE — Progress Notes (Signed)
Kidney function continues to improve. Reviewed with Sarah [name] 02/12/23. Continued hydration and avoidance of NSAIDs.

## 2023-02-24 ENCOUNTER — Other Ambulatory Visit: Payer: Self-pay | Admitting: Internal Medicine

## 2023-02-24 ENCOUNTER — Telehealth: Payer: Self-pay | Admitting: Internal Medicine

## 2023-02-24 ENCOUNTER — Telehealth: Payer: Self-pay | Admitting: *Deleted

## 2023-02-24 ENCOUNTER — Encounter: Payer: Medicare HMO | Admitting: Internal Medicine

## 2023-02-24 DIAGNOSIS — F332 Major depressive disorder, recurrent severe without psychotic features: Secondary | ICD-10-CM

## 2023-02-24 NOTE — Telephone Encounter (Signed)
Opened in error

## 2023-02-24 NOTE — Progress Notes (Unsigned)
  Care Coordination  Outreach Note  02/24/2023 Name: Sarah [name] MRN: [id number] DOB: 05-Jun-1946   Care Coordination Outreach Attempts: An unsuccessful telephone outreach was attempted today to offer the patient information about available care coordination services.  Follow Up Plan:  Additional outreach attempts will be made to offer the patient care coordination information and services.   Encounter Outcome:  No Answer  Burman Nieves, CCMA Care Coordination Care Guide Direct Dial: [phone]

## 2023-02-25 NOTE — Progress Notes (Unsigned)
  Care Coordination  Outreach Note  02/25/2023 Name: Sarah [name] MRN: [id number] DOB: 1945-07-18   Care Coordination Outreach Attempts: A second unsuccessful outreach was attempted today to offer the patient with information about available care coordination services.  Follow Up Plan:  Additional outreach attempts will be made to offer the patient care coordination information and services.   Encounter Outcome:  No Answer  Burman Nieves, CCMA Care Coordination Care Guide Direct Dial: [phone]

## 2023-02-26 NOTE — Progress Notes (Signed)
  Care Coordination  Outreach Note  02/26/2023 Name: Sarah [name] MRN: [id number] DOB: Jun 01, 1946   Care Coordination Outreach Attempts: A third unsuccessful outreach was attempted today to offer the patient with information about available care coordination services.  Follow Up Plan:  No further outreach attempts will be made at this time. We have been unable to contact the patient to offer or enroll patient in care coordination services  Encounter Outcome:  No Answer  Burman Nieves, [facility] Care Coordination Care Guide Direct Dial: [phone]

## 2023-03-03 ENCOUNTER — Other Ambulatory Visit: Payer: Self-pay

## 2023-03-03 ENCOUNTER — Ambulatory Visit (INDEPENDENT_AMBULATORY_CARE_PROVIDER_SITE_OTHER): Payer: Medicare HMO | Admitting: Internal Medicine

## 2023-03-03 ENCOUNTER — Encounter: Payer: Self-pay | Admitting: Internal Medicine

## 2023-03-03 VITALS — BP 120/59 | HR 72 | Temp 98.1°F | Ht 64.0 in | Wt 219.3 lb

## 2023-03-03 DIAGNOSIS — R238 Other skin changes: Secondary | ICD-10-CM | POA: Insufficient documentation

## 2023-03-03 DIAGNOSIS — Z6837 Body mass index (BMI) 37.0-37.9, adult: Secondary | ICD-10-CM | POA: Diagnosis not present

## 2023-03-03 DIAGNOSIS — I491 Atrial premature depolarization: Secondary | ICD-10-CM | POA: Diagnosis not present

## 2023-03-03 DIAGNOSIS — F332 Major depressive disorder, recurrent severe without psychotic features: Secondary | ICD-10-CM

## 2023-03-03 DIAGNOSIS — M67814 Other specified disorders of tendon, left shoulder: Secondary | ICD-10-CM

## 2023-03-03 DIAGNOSIS — Z1211 Encounter for screening for malignant neoplasm of colon: Secondary | ICD-10-CM

## 2023-03-03 DIAGNOSIS — Z0000 Encounter for general adult medical examination without abnormal findings: Secondary | ICD-10-CM

## 2023-03-03 NOTE — Progress Notes (Signed)
Established Patient Office Visit  Subjective   Patient ID: Sarah [name], female    DOB: Jul 08, 1946  Age: 77 y.o. MRN: [id number]  Chief Complaint  Patient presents with   Follow-up    Sarah [name] returns to clinic today for follow-up of mood symptoms. She was last seen in clinic 7/30. Please see assessment/plan in problem-based charting for further details of today's visit.   Patient Active Problem List   Diagnosis Date Noted   Obesity, morbid (HCC) 03/03/2023   Other skin changes 03/03/2023   PAC (premature atrial contraction) 02/10/2023   Chronic pain of both knees 11/25/2022   Tendinosis of left rotator cuff 09/15/2022   Fall 05/15/2022   Cholelithiases 05/31/2020   Uterine fibroid 05/31/2020   OSA (obstructive sleep apnea) 06/15/2018   Chronic fatigue 02/17/2017   Severe recurrent major depression (HCC) 05/07/2016   Vitamin D deficiency 09/18/2015   Essential hypertension, benign 10/08/2012   Health care maintenance 07/09/2012   Chronic pain--diffuse, worse LLE 04/12/2012   Hyperlipidemia 06/16/2011      Objective:     BP (!) 120/59 (BP Location: Right Arm, Patient Position: Sitting, Cuff Size: Normal)   Pulse 72   Temp 98.1 F (36.7 C) (Oral)   Ht 5\' 4"  (1.626 m)   Wt 219 lb 4.8 oz (99.5 kg)   SpO2 99% Comment: RA  BMI 37.64 kg/m  BP Readings from Last 3 Encounters:  03/03/23 (!) 120/59  02/10/23 128/65  12/04/22 (!) 150/78   Wt Readings from Last 3 Encounters:  03/03/23 219 lb 4.8 oz (99.5 kg)  02/10/23 213 lb 14.4 oz (97 kg)  12/04/22 212 lb (96.2 kg)    Physical Exam Constitutional:      Appearance: Normal appearance.  Eyes:     Extraocular Movements: Extraocular movements intact.     Conjunctiva/sclera: Conjunctivae normal.  Cardiovascular:     Rate and Rhythm: Normal rate.  Pulmonary:     Effort: Pulmonary effort is normal.  Skin:    Comments: Hyperpigmentation of periorbital skin, possible mild edema L > R. Hyperpigmentation of skin over  bilateral hands, focused on the MCP/PIP.  Neurological:     Mental Status: She is alert.     Comments: 5/5 strength in b/l hip flexion and right shoulder abduction. Limited strength in left shoulder abduction d/t pain.  Psychiatric:        Behavior: Behavior normal.        Thought Content: Thought content normal.     Comments: Intermittently tearful      Assessment & Plan:   Problem List Items Addressed This Visit       Cardiovascular and Mediastinum   PAC (premature atrial contraction)    Continuing to have episodes of dyspnea as described at last visit, suspect secondary to PACs. No dizziness, chest pain, palpitations, or dyspnea at today's visit. We discussed obtaining a TTE today to rule out structural pathology.  Plan -TTE      Relevant Orders   ECHOCARDIOGRAM COMPLETE     Musculoskeletal and Integument   Tendinosis of left rotator cuff    Previously referred to sports medicine for evaluation and management of left rotator cuff tendinosis. She was able to see them in May, there was concern for a full thickness tear at that time. She was unable to tolerate an injection but return in one week was recommended for re-evaluation. She unfortunately was not able to f/u. We discussed returning to sports med clinic for f/u which she  will schedule. She has not yet completed PT, referral previously sent. She has been avoiding as NSAIDs as discussed due to kidney function.  Plan -F/u with sports medication, establish with PT      Other skin changes    Sarah [name] has noted darkening of the skin surrounding her eyes and over bilateral hands over the past 6 months. Exam shows hyperpigmentation inferior and lateral to bilateral eyes, with possible edema L > R. Hyperpigmentation of the skin of the hands, most notably on the MCP/PIPs as well. I am not sure what the cause of these changes may be at this time, although there is concern for skin changes of dermatomyositis given the distribution of  findings. No clear evidence of myositis on clinical examination, strength is relatively preserved. Sarah [name] notes she wears out easily with walking but not sure if this is significantly due to weakness or fatigue from deconditioning, obesity, etc. She has not noticed difficulty with overhead movements to indicate proximal UE weakness. I have ordered lab evaluation for possible myositis today and will consider rheumatology referral pending results.  Plan -CK, aldolase, ESR/CRP, UA, ANA  Addendum 8/21 I spoke with Sarah [name] over the telephone 8/21 to share the plan to start lab evaluation for dermatomyositis with possible rheumatology referral. She expressed understanding and will come in for labs, however she became very emotional and expressed concerns that this is evidence that she is dying. I explained that we do not yet have a diagnosis, but I do not think this is a sign of end of life and we will work together to find out the cause and treat what can be treated. She continued to be quite tearful and the call was disconnected. I called back, was able to reach Sarah [name] and she said she would be in to get lab work before hanging up again. I was unable to reach her again but left a voicemail urging her to call the clinic to set up lab visit and ensure she is okay.      Relevant Orders   Aldolase   ANA w/Reflex if Positive   CK, total   CRP (C-Reactive Protein)   Sed Rate (ESR)   Urinalysis, Reflex Microscopic     Other   Health care maintenance    Discussed need for repeat CSY, referral placed. Encouraged shingles vaccination. She will require influenza vaccine this season.      Severe recurrent major depression (HCC) - Primary    Continuing to struggle with anxiety and depression given recent life stressors and grief. She was less tearful during this visit than last, although PHQ9 and GAD7 remain elevated at 15 and 16, respectively. Behavioral health was unable to reach her to establish, we  discussed another referral and making sure to keep her phone nearby over the next several weeks. She does have support from two of her children, although notes significant financial and emotional stress from the other two. She continues on sertraline, bupropion, and buspirone. Sertraline and buspirone were increased at last visit ~3 weeks and we discussed we may not be seeing full SSRI effect yet. She does feel like she is overall doing a little better. Denies SI.  Plan -Continue sertraline 100 mg, bupropion 150 mg, and buspirone 10 mg tid -Referral to [facility] behavioral health services -Return in 4 weeks      Relevant Orders   AMB Referral to Community Care Coordinaton ([facility] Patients)   Obesity, morbid (HCC)    BMI of  37 associated with hypertension, hyperlipidemia and osteoarthritis. We discussed this today and reviewed unhealthy eating pattern recently. Ms. Foto was able to identify areas for improvement and understood the need to make changes. She notes low motivation for cooking right now due to mood, hopefully this will continue to improve over time.  Plan -Counseling provided on lifestyle modification      Other Visit Diagnoses     BMI 37.0-37.9, adult       Screening for colon cancer       Relevant Orders   Ambulatory referral to Gastroenterology      Return in about 4 weeks (around 03/31/2023) for fu mood.    Dickie La, MD

## 2023-03-03 NOTE — Patient Instructions (Addendum)
Thank you for coming to your appointment today.  Please call the sports medicine clinic to schedule a f/u appointment for shoulder and knee pain.  I have asked the counseling team to reach back out to you. If you do not hear from them, please let me know.  I have sent a referral to the GI team for a colonoscopy. Hopefully, this will be the last one you need to have!  Please contact your pharmacy about scheduling the shingles (Shingrix) vaccine. The flu shot will be ready in our clinic starting in September. You may also get this at your pharmacy.

## 2023-03-03 NOTE — Assessment & Plan Note (Signed)
Previously referred to sports medicine for evaluation and management of left rotator cuff tendinosis. She was able to see them in May, there was concern for a full thickness tear at that time. She was unable to tolerate an injection but return in one week was recommended for re-evaluation. She unfortunately was not able to f/u. We discussed returning to sports med clinic for f/u which she will schedule. She has not yet completed PT, referral previously sent. She has been avoiding as NSAIDs as discussed due to kidney function.  Plan -F/u with sports medication, establish with PT

## 2023-03-03 NOTE — Assessment & Plan Note (Signed)
Continuing to struggle with anxiety and depression given recent life stressors and grief. She was less tearful during this visit than last, although PHQ9 and GAD7 remain elevated at 15 and 16, respectively. Behavioral health was unable to reach her to establish, we discussed another referral and making sure to keep her phone nearby over the next several weeks. She does have support from two of her children, although notes significant financial and emotional stress from the other two. She continues on sertraline, bupropion, and buspirone. Sertraline and buspirone were increased at last visit ~3 weeks and we discussed we may not be seeing full SSRI effect yet. She does feel like she is overall doing a little better. Denies SI.  Plan -Continue sertraline 100 mg, bupropion 150 mg, and buspirone 10 mg tid -Referral to [facility] behavioral health services -Return in 4 weeks

## 2023-03-03 NOTE — Assessment & Plan Note (Addendum)
Sarah [name] has noted darkening of the skin surrounding her eyes and over bilateral hands over the past 6 months. Exam shows hyperpigmentation inferior and lateral to bilateral eyes, with possible edema L > R. Hyperpigmentation of the skin of the hands, most notably on the MCP/PIPs as well. I am not sure what the cause of these changes may be at this time, although there is concern for skin changes of dermatomyositis given the distribution of findings. No clear evidence of myositis on clinical examination, strength is relatively preserved. Sarah [name] notes she wears out easily with walking but not sure if this is significantly due to weakness or fatigue from deconditioning, obesity, etc. She has not noticed difficulty with overhead movements to indicate proximal UE weakness. I have ordered lab evaluation for possible myositis today and will consider rheumatology referral pending results.  Plan -CK, aldolase, ESR/CRP, UA, ANA  Addendum 8/21 I spoke with Sarah [name] over the telephone 8/21 to share the plan to start lab evaluation for dermatomyositis with possible rheumatology referral. She expressed understanding and will come in for labs, however she became very emotional and expressed concerns that this is evidence that she is dying. I explained that we do not yet have a diagnosis, but I do not think this is a sign of end of life and we will work together to find out the cause and treat what can be treated. She continued to be quite tearful and the call was disconnected. I called back, was able to reach Sarah [name] and she said she would be in to get lab work before hanging up again. I was unable to reach her again but left a voicemail urging her to call the clinic to set up lab visit and ensure she is okay.

## 2023-03-03 NOTE — Assessment & Plan Note (Signed)
BMI of 37 associated with hypertension, hyperlipidemia and osteoarthritis. We discussed this today and reviewed unhealthy eating pattern recently. Sarah [name] was able to identify areas for improvement and understood the need to make changes. She notes low motivation for cooking right now due to mood, hopefully this will continue to improve over time.  Plan -Counseling provided on lifestyle modification

## 2023-03-03 NOTE — Assessment & Plan Note (Signed)
Discussed need for repeat CSY, referral placed. Encouraged shingles vaccination. She will require influenza vaccine this season.

## 2023-03-03 NOTE — Assessment & Plan Note (Signed)
Continuing to have episodes of dyspnea as described at last visit, suspect secondary to PACs. No dizziness, chest pain, palpitations, or dyspnea at today's visit. We discussed obtaining a TTE today to rule out structural pathology.  Plan -TTE

## 2023-03-04 ENCOUNTER — Other Ambulatory Visit (INDEPENDENT_AMBULATORY_CARE_PROVIDER_SITE_OTHER): Payer: Medicare HMO

## 2023-03-04 ENCOUNTER — Telehealth: Payer: Self-pay | Admitting: *Deleted

## 2023-03-04 DIAGNOSIS — R238 Other skin changes: Secondary | ICD-10-CM

## 2023-03-04 NOTE — Progress Notes (Signed)
  Care Coordination   Note   03/04/2023 Name: EMERAL RHIM MRN: [id number] DOB: 02-May-1946  [name] is a 77 y.o. year old female who sees Dickie La, MD for primary care. I reached out to [name] by phone today to offer care coordination services.  Ms. Waltman was given information about Care Coordination services today including:   The Care Coordination services include support from the care team which includes your Nurse Coordinator, Clinical Social Worker, or Pharmacist.  The Care Coordination team is here to help remove barriers to the health concerns and goals most important to you. Care Coordination services are voluntary, and the patient may decline or stop services at any time by request to their care team member.   Care Coordination Consent Status: Patient agreed to services and verbal consent obtained.   Follow up plan:  Telephone appointment with care coordination team member scheduled for:  03/05/23  Encounter Outcome:  Pt. Scheduled  [facility] Coordination Care Guide  Direct Dial: [phone]

## 2023-03-04 NOTE — Addendum Note (Signed)
Addended by: Dickie La on: 03/04/2023 09:22 AM   Modules accepted: Orders

## 2023-03-05 ENCOUNTER — Encounter: Payer: Self-pay | Admitting: *Deleted

## 2023-03-05 ENCOUNTER — Telehealth: Payer: Self-pay | Admitting: *Deleted

## 2023-03-05 LAB — URINALYSIS, ROUTINE W REFLEX MICROSCOPIC
Bilirubin, UA: NEGATIVE
Glucose, UA: NEGATIVE
Ketones, UA: NEGATIVE
Nitrite, UA: NEGATIVE
Protein,UA: NEGATIVE
RBC, UA: NEGATIVE
Specific Gravity, UA: 1.021 (ref 1.005–1.030)
Urobilinogen, Ur: 1 mg/dL (ref 0.2–1.0)
pH, UA: 7 (ref 5.0–7.5)

## 2023-03-05 LAB — MICROSCOPIC EXAMINATION
Casts: NONE SEEN /LPF
RBC, Urine: NONE SEEN /HPF (ref 0–2)

## 2023-03-05 NOTE — Patient Outreach (Signed)
  Care Coordination   03/05/2023 Name: Sarah [name] MRN: [id number] DOB: 09-24-1945   Care Coordination Outreach Attempts:  An unsuccessful telephone outreach was attempted today to offer the patient information about available care coordination services.  Follow Up Plan:  Additional outreach attempts will be made to offer the patient care coordination information and services.   Encounter Outcome:  No Answer   Care Coordination Interventions:  No, not indicated    Reece Levy, MSW, LCSW Clinical Social Worker [phone]

## 2023-03-06 LAB — ALDOLASE: Aldolase: 4.2 U/L (ref 3.3–10.3)

## 2023-03-06 LAB — C-REACTIVE PROTEIN: CRP: 7 mg/L (ref 0–10)

## 2023-03-06 LAB — ANA W/REFLEX IF POSITIVE: Anti Nuclear Antibody (ANA): NEGATIVE

## 2023-03-06 LAB — SEDIMENTATION RATE: Sed Rate: 34 mm/h (ref 0–40)

## 2023-03-06 LAB — CK: Total CK: 131 U/L (ref 32–182)

## 2023-03-06 NOTE — Addendum Note (Signed)
Addended by: Dickie La on: 03/06/2023 08:58 AM   Modules accepted: Orders

## 2023-03-06 NOTE — Progress Notes (Signed)
UA with no evidence of myoglobinuria. + WBC, bacteria. No symptoms of UTI at this time, no indication for treatment. Reviewed with Ms. Rentz 8/23.

## 2023-03-06 NOTE — Progress Notes (Signed)
Labs wnl. No lab evidence of muscle inflammation. Reviewed with Ms. France 8/23. Discussed referral to rheumatology to rule out amyopathic dermatomyositis.

## 2023-03-09 ENCOUNTER — Telehealth: Payer: Self-pay | Admitting: *Deleted

## 2023-03-09 NOTE — Progress Notes (Unsigned)
  Care Coordination Note  03/09/2023 Name: Sarah [name] MRN: [id number] DOB: 03/12/1946  Sarah [name] is a 77 y.o. year old female who is a primary care patient of Dickie La, MD and is actively engaged with the care management team. I reached out to Sarah [name] by phone today to assist with re-scheduling an initial visit with the Licensed Clinical Social Worker  Follow up plan: Unsuccessful telephone outreach attempt made. A HIPAA compliant phone message was left for the patient providing contact information and requesting a return call.   [facility]  Care Coordination Care Guide  Direct Dial: [phone]

## 2023-03-12 NOTE — Progress Notes (Signed)
  Care Coordination Note  03/12/2023 Name: [name] MRN: [id number] DOB: [birth year]  Sarah [name] is a 77 y.o. year old female who is a primary care patient of Dickie La, MD and is actively engaged with the care management team. I reached out to Sarah [name] by phone today to assist with re-scheduling an initial visit with the Licensed Clinical Social Worker  Follow up plan: Unsuccessful telephone outreach attempt made. A HIPAA compliant phone message was left for the patient providing contact information and requesting a return call. We have been unable to make contact with the patient for follow up. The care management team is available to follow up with the patient after provider conversation with the patient regarding recommendation for care management engagement and subsequent re-referral to the care management team.   [facility] Coordination Care Guide  Direct Dial: [phone]

## 2023-03-23 ENCOUNTER — Encounter: Payer: Self-pay | Admitting: *Deleted

## 2023-03-23 ENCOUNTER — Ambulatory Visit: Payer: Self-pay | Admitting: *Deleted

## 2023-03-23 NOTE — Patient Outreach (Signed)
  Care Coordination   Initial Visit Note   03/23/2023 Name: Sarah [name] MRN: [id number] DOB: 1945/12/13  Sarah [name] is a 77 y.o. year old female who sees Dickie La, MD for primary care. I spoke with  Sarah [name] by phone today.  What matters to the patients health and wellness today?  Get help with loneliness, depression and become more active.     Goals Addressed             This Visit's Progress    Reduce depression/loneliness symptoms       Activities and task to complete in order to accomplish goals.   Call your insurance provider for more information about your Enhanced Benefits ( ) Expect phone call from [employer] to schedule your counseling appointment. Keep all upcoming appointment discussed today Continue with compliance of taking medication prescribed by Doctor Self Support options  (seek volunteer opportunities, senior/rec center activities and other ways to be be more active/involved) Complete your HCPOA/advance directive and other documents I have placed a referral to the community resource care guide they will call you   I have place a referral with [employer] to assist with managing your mental health needs. They will contact you. Please contact them  to follow up if needed [phone].         SDOH assessments and interventions completed:  Yes  SDOH Interventions Today    Flowsheet Row Most Recent Value  SDOH Interventions   Food Insecurity Interventions Intervention Not Indicated  Transportation Interventions Intervention Not Indicated  Utilities Interventions Intervention Not Indicated  Alcohol Usage Interventions Intervention Not Indicated (Score <7)  Financial Strain Interventions Other (Comment)  [pt reports hardship with home repairs- kitchen leak]  Social Connections Interventions Other (Comment)  [[facility] center]  Health Literacy Interventions Intervention Not Indicated        Care Coordination Interventions:  Yes, provided  Interventions  Today    Flowsheet Row Most Recent Value  Chronic Disease   Chronic disease during today's visit Other  [depression]  General Interventions   General Interventions Discussed/Reviewed General Interventions Discussed, General Interventions Reviewed, Community Resources  Exercise Interventions   Exercise Discussed/Reviewed Exercise Discussed  [Encouraged pt to consider participation with [facility] for activities as well a the local [employer] program as a way to become more active and to connect/socialize/make friends,etc]  Mental Health Interventions   Mental Health Discussed/Reviewed Mental Health Discussed, Mental Health Reviewed, Coping Strategies, Depression, Grief and Loss  [Pt acknowleges depression, loneliness, grief/loss and challenges with family/friend support]  Pharmacy Interventions   Pharmacy Dicussed/Reviewed Pharmacy Topics Discussed  [Pt reports taking all RX as prescribed- uses [facility]]  Safety Interventions   Safety Discussed/Reviewed Safety Discussed  [Advised pt of the 27/7 phone line she can call for mental health support by calling "988"]  Advanced Directive Interventions   Advanced Directives Discussed/Reviewed Advanced Directives Discussed  [Pt reports she is currently planning to get all documents/affairs in place and is connecting with Attorney to complete]       Follow up plan: Follow up call scheduled for 04/06/23    Encounter Outcome:  Patient Visit Completed

## 2023-03-23 NOTE — Patient Instructions (Signed)
Visit Information  Thank you for taking time to visit with me today. Please don't hesitate to contact me if I can be of assistance to you.   Following are the goals we discussed today:   Goals Addressed             This Visit's Progress    Reduce depression/loneliness symptoms       Activities and task to complete in order to accomplish goals.   Call your insurance provider for more information about your Enhanced Benefits ( ) Expect phone call from [facility] to schedule your counseling appointment. Keep all upcoming appointment discussed today Continue with compliance of taking medication prescribed by Doctor Self Support options  (seek volunteer opportunities, senior/rec center activities and other ways to be be more active/involved) Complete your HCPOA/advance directive and other documents I have placed a referral to the community resource care guide they will call you   I have place a referral with [facility] to assist with managing your mental health needs. They will contact you. Please contact them  to follow up if needed [phone].         Our next appointment is by telephone on 04/06/23  Please call the care guide team at [phone] if you need to cancel or reschedule your appointment.   If you are experiencing a Mental Health or Behavioral Health Crisis or need someone to talk to, please call the Suicide and Crisis Lifeline: 988 call 911   The patient verbalized understanding of instructions, educational materials, and care plan provided today and DECLINED offer to receive copy of patient instructions, educational materials, and care plan.   Telephone follow up appointment with care management team member scheduled for: Reece Levy, MSW, LCSW Clinical Social Worker [phone]

## 2023-03-27 ENCOUNTER — Telehealth: Payer: Self-pay

## 2023-03-27 ENCOUNTER — Other Ambulatory Visit: Payer: Self-pay | Admitting: Student

## 2023-03-27 DIAGNOSIS — F322 Major depressive disorder, single episode, severe without psychotic features: Secondary | ICD-10-CM

## 2023-03-27 MED ORDER — BUSPIRONE HCL 5 MG PO TABS: 10.0000 mg | ORAL_TABLET | Freq: Three times a day (TID) | ORAL | 1 refills | Status: DC | PRN

## 2023-03-27 NOTE — Patient Outreach (Signed)
Care Coordination   03/27/2023 Name: Sarah [name] MRN: [id number] DOB: 1946/04/02   Care Coordination Outreach Attempts:  An unsuccessful telephone outreach was attempted today to offer the patient information about available care coordination services.  Follow Up Plan:  Additional outreach attempts will be made to offer the patient care coordination information and services.   Encounter Outcome:  No Answer   Care Coordination Interventions:  No, not indicated    Bevelyn Ngo, BSW, CDP [place]  [facility], [facility] Social Worker Direct Dial: [phone]  Fax: [phone]

## 2023-03-31 ENCOUNTER — Encounter: Payer: Self-pay | Admitting: Internal Medicine

## 2023-03-31 ENCOUNTER — Other Ambulatory Visit: Payer: Self-pay

## 2023-03-31 ENCOUNTER — Ambulatory Visit (INDEPENDENT_AMBULATORY_CARE_PROVIDER_SITE_OTHER): Payer: Medicare HMO | Admitting: Internal Medicine

## 2023-03-31 VITALS — BP 122/59 | HR 65 | Temp 98.6°F | Ht 64.0 in | Wt 222.7 lb

## 2023-03-31 DIAGNOSIS — F332 Major depressive disorder, recurrent severe without psychotic features: Secondary | ICD-10-CM

## 2023-03-31 DIAGNOSIS — Z23 Encounter for immunization: Secondary | ICD-10-CM | POA: Diagnosis not present

## 2023-03-31 DIAGNOSIS — Z0000 Encounter for general adult medical examination without abnormal findings: Secondary | ICD-10-CM

## 2023-03-31 DIAGNOSIS — I491 Atrial premature depolarization: Secondary | ICD-10-CM

## 2023-03-31 DIAGNOSIS — G8929 Other chronic pain: Secondary | ICD-10-CM

## 2023-03-31 DIAGNOSIS — M67814 Other specified disorders of tendon, left shoulder: Secondary | ICD-10-CM | POA: Diagnosis not present

## 2023-03-31 DIAGNOSIS — R238 Other skin changes: Secondary | ICD-10-CM

## 2023-03-31 MED ORDER — SERTRALINE HCL 50 MG PO TABS: 50.0000 mg | ORAL_TABLET | Freq: Every day | ORAL | 1 refills | Status: DC

## 2023-03-31 NOTE — Patient Instructions (Addendum)
Thank you, Ms.[name] for allowing [place] to provide your care today. Today we discussed the following.  Mood : Increase dose of sertraline to 150 mg once a day. You will take a 100 mg tablet AND a 50 mg tablet. Continue taking bupropion 150 mg once a day, and buspirone 10 mg three times daily as needed.    Exercise: You can try to see if you like the PREP program at the [facility] to be more active. It can help you get out of the house and also meet new people. We have put a referral. If you don't hear back within 2 weeks, give [place] a call.  Important phone numbers:  Social worker, Reece Levy; [phone]; Appointment on 9/23 at 11:00 am.  [facility] : [phone].  Physical Therapist : [phone]  Skin Rash : we are referring you to a dermatologist. They will reach out to you. I will check on the heart ultrasound and they will reach out to schedule.  You received a flu vaccine today.    Referrals ordered today:   Referral Orders         Ambulatory referral to Physical Therapy         Ambulatory referral to Dermatology         Amb Referral To Provider Referral Exercise Program (P.R.E.P)       I have ordered the following medication/changed the following medications:   Stop the following medications: There are no discontinued medications.   Start the following medications: Meds ordered this encounter  Medications   sertraline (ZOLOFT) 50 MG tablet    Sig: Take 1 tablet (50 mg total) by mouth daily. Take in addition to 100 mg tablet for a total of sertraline 150 mg daily.    Dispense:  30 tablet    Refill:  1     Follow up: 1 month   Remember:   We look forward to seeing you next time. Please call our clinic at [phone] if you have any questions or concerns. The best time to call is Monday-Friday from 9am-4pm, but there is someone available 24/7. If after hours or the weekend, call the main hospital number and ask for the Internal Medicine Resident On-Call. If you  need medication refills, please notify your pharmacy one week in advance and they will send [place] a request.   Thank you for trusting [place] with your care. Wishing you the best! [facility]

## 2023-03-31 NOTE — Assessment & Plan Note (Addendum)
She was referred to the Heart center for an echocardiogram, however not yet scheduled. She denies SOB or chest pains today, Denies n/v, or edema.   - Message sent to referral coordinator for TTE scheduling

## 2023-03-31 NOTE — Assessment & Plan Note (Signed)
-   received her flu vaccine today.

## 2023-03-31 NOTE — Assessment & Plan Note (Addendum)
Chronic problem. She reports she feels a little better than her last visit. Her PHQ9 is 15 and GAD7 is 10, stable and slightly decreased from last visit, respectively. She reported she forgets thing easily and did not remember speaking with a Child psychotherapist when they had called. Mini-cog assessment today was normal. She thinks she could still get some help, as she does not feel like doing anything and is sedentary. Her daughters call her frequently and encourage her to be more active. She was reached out by Child psychotherapist and referred for [facility] counseling however there was loss of follow up. She wanted phone number to both social work and counseling so she could directly call them because she has missed their calls. She is currently on bupropion 150 mg once a day, buspirone 10 mg three times a day and sertraline 100 mg once a day.  Assessment No concern of significant cognitive impairment or dementia. Chronic depression. Improving but still with significant distress. We discussed initiation of therapy +/- increase in medications and have decided through shared decision making to move forward with both.   Plan  - Increase sertraline dose from 100 to 150 mg a day - Continue bupropion 150 mg once a day, buspirone 10 mg three times a day as needed for anxiety - If no f/u with care coordinator, reach out to:  Social worker, Reece Levy; [phone]; Next appointment is on 9/23 at 11:00 am.  [facility] : [phone] - Close f/u in one month, will hopefully be able to space out visits at that time

## 2023-03-31 NOTE — Assessment & Plan Note (Addendum)
Sarah [name] has bilateral ocular rash and hyperpigmentation on her PIP joints which presented a month ago. Since then she reports her rash has improved a little but still presnet. She has not tried putting anything but moisturizers. Today, the rash is still prominent L > R eye. Labs for aldolase, CK, CRP and ESR were negative. Initially wanted her to see the rheumatologist for potential dermatomyositis given distribution of skin changes, however was recommended to be sent to dermatology after review of the referral. Given her negative labs, dermatology could be more appropriate for her at the moment. She denies any proximal muscles weakness or extreme fatigue above her baseline.   Assessment  - Less suspicion for dermatomyositis but still possible. Could also be dermatitis vs ocular rosacea vs psoriasis. No concern for periorbital cellulitis.   Plan  - Referral to dermatology

## 2023-03-31 NOTE — Assessment & Plan Note (Signed)
Mrs. Primer has a sedentary lifestyle and mostly stays inside her house because she is worried about being outside by herself. Her BMI is 38.23. She also admits she needs to work on her diet as she likes to eat a lot of sweets. She wants to be more active and is willing to try Provider Referral Exercise Program (P.R.E.P)  at the [facility] where she can also meet other people. She often misses phone calls from unknown numbers. Explained she should expect a call from them and call the clinic if she does not hear back in 2 weeks.   - Referral to P.R.E.P. - Call clinic if no f/u within 2 weeks.

## 2023-03-31 NOTE — Assessment & Plan Note (Signed)
Referral placed again today due to scheduling concerns when last sent. Phone number provided to Ms. Westergard for scheduling.

## 2023-03-31 NOTE — Progress Notes (Signed)
Established Patient Office Visit  Subjective   Patient ID: CAITHLIN HAAN, female    DOB: 10-May-1946  Age: 77 y.o. MRN: [id number]  Chief Complaint  Patient presents with   Follow-up    Ms. Nakama returns today to follow-up on depression and anxiety, as well as rash and chronic knee/shoulder pain. Please see assessment/plan in problem-based charting for further details of today's visit.    Patient Active Problem List   Diagnosis Date Noted   Obesity, morbid (HCC) 03/03/2023   Other skin changes 03/03/2023   PAC (premature atrial contraction) 02/10/2023   Chronic pain of both knees 11/25/2022   Tendinosis of left rotator cuff 09/15/2022   Fall 05/15/2022   Cholelithiases 05/31/2020   Uterine fibroid 05/31/2020   OSA (obstructive sleep apnea) 06/15/2018   Chronic fatigue 02/17/2017   Severe recurrent major depression (HCC) 05/07/2016   Vitamin D deficiency 09/18/2015   Essential hypertension, benign 10/08/2012   Health care maintenance 07/09/2012   Chronic pain--diffuse, worse LLE 04/12/2012   Hyperlipidemia 06/16/2011      Objective:     BP (!) 122/59 (BP Location: Right Arm, Patient Position: Sitting, Cuff Size: Large)   Pulse 65   Temp 98.6 F (37 C) (Oral)   Ht 5\' 4"  (1.626 m)   Wt 222 lb 11.2 oz (101 kg)   SpO2 99% Comment: RA  BMI 38.23 kg/m  BP Readings from Last 3 Encounters:  03/31/23 (!) 122/59  03/03/23 (!) 120/59  02/10/23 128/65   Wt Readings from Last 3 Encounters:  03/31/23 222 lb 11.2 oz (101 kg)  03/03/23 219 lb 4.8 oz (99.5 kg)  02/10/23 213 lb 14.4 oz (97 kg)     Physical Exam Constitutional:      General: She is not in acute distress.    Appearance: Normal appearance.  Skin:    Comments: Hyperpigmentation of periorbital skin, possible mild edema L > R. Hyperpigmentation of skin over bilateral hands, focused on the PIP joints.   Neurological:     Mental Status: She is alert.        Assessment & Plan:   Problem List Items  Addressed This Visit       Cardiovascular and Mediastinum   PAC (premature atrial contraction)    She was referred to the Heart center for an echocardiogram, however not yet scheduled. She denies SOB or chest pains today, Denies n/v, or edema.   - Message sent to referral coordinator for TTE scheduling        Musculoskeletal and Integument   Tendinosis of left rotator cuff    Referral placed again today due to scheduling concerns when last sent. Phone number provided to Ms. Mendel for scheduling.      Relevant Orders   Ambulatory referral to Physical Therapy   Other skin changes    Mrs. Solly has bilateral ocular rash and hyperpigmentation on her PIP joints which presented a month ago. Since then she reports her rash has improved a little but still presnet. She has not tried putting anything but moisturizers. Today, the rash is still prominent L > R eye. Labs for aldolase, CK, CRP and ESR were negative. Initially wanted her to see the rheumatologist for potential dermatomyositis given distribution of skin changes, however was recommended to be sent to dermatology after review of the referral. Given her negative labs, dermatology could be more appropriate for her at the moment. She denies any proximal muscles weakness or extreme fatigue above her baseline.  Assessment  - Less suspicion for dermatomyositis but still possible. Could also be dermatitis vs ocular rosacea vs psoriasis. No concern for periorbital cellulitis.   Plan  - Referral to dermatology         Relevant Orders   Ambulatory referral to Dermatology     Other   Health care maintenance    - received her flu vaccine today.       Severe recurrent major depression (HCC) - Primary    Chronic problem. She reports she feels a little better than her last visit. Her PHQ9 is 15 and GAD7 is 10, stable and slightly decreased from last visit, respectively. She reported she forgets thing easily and did not remember speaking with a  Child psychotherapist when they had called. Mini-cog assessment today was normal. She thinks she could still get some help, as she does not feel like doing anything and is sedentary. Her daughters call her frequently and encourage her to be more active. She was reached out by Child psychotherapist and referred for [facility] counseling however there was loss of follow up. She wanted phone number to both social work and counseling so she could directly call them because she has missed their calls. She is currently on bupropion 150 mg once a day, buspirone 10 mg three times a day and sertraline 100 mg once a day.  Assessment No concern of significant cognitive impairment or dementia. Chronic depression. Improving but still with significant distress. We discussed initiation of therapy +/- increase in medications and have decided through shared decision making to move forward with both.   Plan  - Increase sertraline dose from 100 to 150 mg a day - Continue bupropion 150 mg once a day, buspirone 10 mg three times a day as needed for anxiety - If no f/u with care coordinator, reach out to:  Social worker, Reece Levy; [phone]; Next appointment is on 9/23 at 11:00 am.  [facility] : [phone] - Close f/u in one month, will hopefully be able to space out visits at that time      Relevant Medications   sertraline (ZOLOFT) 50 MG tablet   Chronic pain of both knees    Mrs. Walpole has chronic knee pain. She currently takes gabapentin 300 mg capsule three times a day. She does not know if being mobile could help with her pain. Asked her to make observations is she feels better. She is interested in physical therapy and becoming more active. Referred to PT and PREP.   - Referral to physical therapy.       Relevant Medications   sertraline (ZOLOFT) 50 MG tablet   Other Relevant Orders   Ambulatory referral to Physical Therapy   Obesity, morbid [facility])    Mrs. Sorce has a sedentary lifestyle and mostly stays  inside her house because she is worried about being outside by herself. Her BMI is 38.23. She also admits she needs to work on her diet as she likes to eat a lot of sweets. She wants to be more active and is willing to try Provider Referral Exercise Program (P.R.E.P)  at the [facility] where she can also meet other people. She often misses phone calls from unknown numbers. Explained she should expect a call from them and call the clinic if she does not hear back in 2 weeks.   - Referral to P.R.E.P. - Call clinic if no f/u within 2 weeks.      Relevant Orders   Amb Referral To  Provider Referral Exercise Program (P.R.E.P)   Other Visit Diagnoses     Encounter for immunization       Relevant Orders   Flu Vaccine Trivalent High Dose (Fluad) (Completed)       Return in about 4 weeks (around 04/28/2023) for fu depression.    Dickie La, MD
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
## 2023-03-31 NOTE — Assessment & Plan Note (Addendum)
Sarah [name] has chronic knee pain. She currently takes gabapentin 300 mg capsule three times a day. She does not know if being mobile could help with her pain. Asked her to make observations is she feels better. She is interested in physical therapy and becoming more active. Referred to PT and PREP.   - Referral to physical therapy.

## 2023-04-01 ENCOUNTER — Ambulatory Visit: Payer: Self-pay

## 2023-04-01 NOTE — Patient Instructions (Signed)
Visit Information  Thank you for taking time to visit with me today. Please don't hesitate to contact me if I can be of assistance to you.   Following are the goals we discussed today:  - Engage with Publishing rights manager to identify activities that interest you -Contact your primary care provider as needed  If you are experiencing a Mental Health or Behavioral Health Crisis or need someone to talk to, please call 1-800-273-TALK (toll free, 24 hour hotline) go to [facility] [address] [place] [phone]) call 911  The patient verbalized understanding of instructions, educational materials, and care plan provided today and DECLINED offer to receive copy of patient instructions, educational materials, and care plan.   No further follow up required: Please contact me as needed  Bevelyn Ngo, BSW, CDP [facility]  [facility], [facility] Social Worker Direct Dial: [phone]  Fax: [phone]

## 2023-04-01 NOTE — Patient Outreach (Signed)
Care Coordination   Follow Up Visit Note   04/01/2023 Name: Sarah [name] MRN: [id number] DOB: [birth year]  Sarah [name] is a 77 y.o. year old female who sees Sarah La, MD for primary care. I spoke with  Sarah [name] by phone today.  What matters to the patients health and wellness today?  Identify options to increase level of engagement    Goals Addressed             This Visit's Progress    COMPLETED: Care Coordination Activities       Care Coordination Interventions: Discussed patient is interested in becoming more engaged in the community  Education provided on local senior centers that offer activities throughout the day Determined the patient is able to drive herself to senior centers and is interested in being placed on the mailing list Collaboration with Wynona Luna with [facility] to request patient be added to the monthly mailing list to receive the newsletter and activity calendar Encouraged the patient to contact SW as needed         SDOH assessments and interventions completed:  No     Care Coordination Interventions:  Yes, provided   Interventions Today    Flowsheet Row Most Recent Value  Chronic Disease   Chronic disease during today's visit Other  [Depression, isolation]  General Interventions   General Interventions Discussed/Reviewed General Interventions Discussed, Programmer, applications  [Referred to [employer] [facility] [employer] [facility] for activities of engagment]        Follow up plan: No further intervention required.   Encounter Outcome:  Patient Visit Completed   Bevelyn Ngo, BSW, CDP [facility]  [facility], [facility] Social Worker Direct Dial: [phone]  Fax: [phone]

## 2023-04-03 ENCOUNTER — Telehealth: Payer: Self-pay | Admitting: *Deleted

## 2023-04-03 NOTE — Telephone Encounter (Signed)
Contacted regarding [facility] referral. Left voice message to return call for more information.

## 2023-04-06 ENCOUNTER — Encounter: Payer: Self-pay | Admitting: *Deleted

## 2023-04-07 ENCOUNTER — Telehealth: Payer: Self-pay | Admitting: *Deleted

## 2023-04-07 NOTE — Patient Outreach (Signed)
Care Coordination   04/07/2023 Name: [name] MRN: [id number] DOB: 07-Jun-1946   Care Coordination Outreach Attempts:  An unsuccessful telephone outreach was attempted today to offer the patient information about available care coordination services.  Follow Up Plan:  Additional outreach attempts will be made to offer the patient care coordination information and services.   Encounter Outcome:  No Answer   Care Coordination Interventions:  No, not indicated   Reece Levy, MSW, LCSW Clinical Social Worker [phone]

## 2023-04-09 ENCOUNTER — Telehealth: Payer: Self-pay | Admitting: *Deleted

## 2023-04-09 NOTE — Progress Notes (Signed)
I reviewed the AWV findings from the provider who conducted the visit. I was present in the office suite and immediately available to provide assistance and direction throughout the time the service was provided. Last mammogram 04/04/2021, repeat annually and scheduled for 04/08/22. Last colonoscopy 08/01/2015 with [facility] per chart review, repeat in 10 years.

## 2023-04-09 NOTE — Progress Notes (Signed)
Care Coordination Note  04/09/2023 Name: MANALI BODOR MRN: [id number] DOB: [birth year]  Sarah [name] is a 76 y.o. year old female who is a primary care patient of Dickie La, MD and is actively engaged with the care management team. I reached out to Sarah [name] by phone today to assist with re-scheduling a follow up visit with the Licensed Clinical Social Worker  Follow up plan: Unsuccessful telephone outreach attempt made. A HIPAA compliant phone message was left for the patient providing contact information and requesting a return call.   Burman Nieves, CCMA Care Coordination Care Guide Direct Dial: [phone]

## 2023-04-17 NOTE — Progress Notes (Signed)
  Care Coordination Note  04/17/2023 Name: TREESA MCCULLY MRN: [id number] DOB: Jul 03, 1946  [name] is a 77 y.o. year old female who is a primary care patient of Dickie La, MD and is actively engaged with the care management team. I reached out to [name] by phone today to assist with re-scheduling a follow up visit with the Licensed Clinical Social Worker  Follow up plan: Telephone appointment with care management team member scheduled for: 04/29/2023   Burman Nieves, [facility] Care Coordination Care Guide Direct Dial: [phone]

## 2023-04-20 ENCOUNTER — Ambulatory Visit (HOSPITAL_COMMUNITY): Admission: RE | Admit: 2023-04-20 | Payer: Medicare HMO | Source: Ambulatory Visit

## 2023-04-26 DIAGNOSIS — M25539 Pain in unspecified wrist: Secondary | ICD-10-CM | POA: Diagnosis not present

## 2023-04-26 DIAGNOSIS — W19XXXA Unspecified fall, initial encounter: Secondary | ICD-10-CM | POA: Diagnosis not present

## 2023-04-27 ENCOUNTER — Emergency Department (HOSPITAL_COMMUNITY): Payer: Medicare HMO

## 2023-04-27 ENCOUNTER — Other Ambulatory Visit: Payer: Self-pay

## 2023-04-27 ENCOUNTER — Encounter (HOSPITAL_COMMUNITY): Payer: Self-pay

## 2023-04-27 ENCOUNTER — Emergency Department (HOSPITAL_COMMUNITY)
Admission: EM | Admit: 2023-04-27 | Discharge: 2023-04-28 | Disposition: A | Discharge status: Home / Self Care | Payer: Medicare HMO | Attending: Emergency Medicine | Admitting: Emergency Medicine

## 2023-04-27 ENCOUNTER — Ambulatory Visit (HOSPITAL_COMMUNITY)
Admission: EM | Admit: 2023-04-27 | Discharge: 2023-04-27 | Disposition: A | Discharge status: Home / Self Care | Payer: Medicare HMO | Attending: Internal Medicine | Admitting: Internal Medicine

## 2023-04-27 DIAGNOSIS — M47812 Spondylosis without myelopathy or radiculopathy, cervical region: Secondary | ICD-10-CM | POA: Diagnosis not present

## 2023-04-27 DIAGNOSIS — Z79899 Other long term (current) drug therapy: Secondary | ICD-10-CM | POA: Insufficient documentation

## 2023-04-27 DIAGNOSIS — M79622 Pain in left upper arm: Secondary | ICD-10-CM | POA: Diagnosis not present

## 2023-04-27 DIAGNOSIS — M542 Cervicalgia: Secondary | ICD-10-CM

## 2023-04-27 DIAGNOSIS — M79602 Pain in left arm: Secondary | ICD-10-CM | POA: Diagnosis not present

## 2023-04-27 DIAGNOSIS — M4802 Spinal stenosis, cervical region: Secondary | ICD-10-CM | POA: Diagnosis not present

## 2023-04-27 DIAGNOSIS — M25512 Pain in left shoulder: Secondary | ICD-10-CM | POA: Diagnosis not present

## 2023-04-27 DIAGNOSIS — Z043 Encounter for examination and observation following other accident: Secondary | ICD-10-CM | POA: Diagnosis not present

## 2023-04-27 DIAGNOSIS — M19012 Primary osteoarthritis, left shoulder: Secondary | ICD-10-CM | POA: Diagnosis not present

## 2023-04-27 DIAGNOSIS — R519 Headache, unspecified: Secondary | ICD-10-CM | POA: Diagnosis not present

## 2023-04-27 DIAGNOSIS — W19XXXA Unspecified fall, initial encounter: Secondary | ICD-10-CM | POA: Insufficient documentation

## 2023-04-27 DIAGNOSIS — M79641 Pain in right hand: Secondary | ICD-10-CM

## 2023-04-27 DIAGNOSIS — M25511 Pain in right shoulder: Secondary | ICD-10-CM | POA: Diagnosis not present

## 2023-04-27 DIAGNOSIS — S199XXA Unspecified injury of neck, initial encounter: Secondary | ICD-10-CM | POA: Diagnosis not present

## 2023-04-27 NOTE — Discharge Instructions (Addendum)
Directly to the emergency department for further evaluation of your injuries

## 2023-04-27 NOTE — ED Provider Notes (Signed)
[place] Sarah [name] to [facility] complaining of midline cervical tenderness and L side body pain   Physical Exam  BP 119/66 (BP Location: Left Arm)   Pulse 78   Temp 97.7 F (36.5 C) (Oral)   Resp 17   Ht 5\' 4"  (1.626 m)   Wt 96.2 kg   SpO2 100%   BMI 36.39 kg/m   Physical Exam  Procedures  Procedures  ED Course / MDM    Medical Decision Making Amount and/or Complexity of Data Reviewed Radiology: ordered.   ***

## 2023-04-27 NOTE — ED Notes (Signed)
Patient is being discharged from the Urgent Care and sent to the Emergency Department via self . Per provider, patient is in need of higher level of care due to fall. Patient is aware and verbalizes understanding of plan of care.  Vitals:   04/27/23 1859  BP: (!) 166/76  Pulse: 70  Resp: 16  Temp: 98 F (36.7 C)  SpO2: 95%
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
## 2023-04-27 NOTE — ED Triage Notes (Signed)
Patient here today with c/o whole body aching since yesterday morning after falling. [place] and [place] to the right side and then rolled onto her back. She tried taking Advil with no relief.

## 2023-04-27 NOTE — ED Provider Notes (Signed)
[place] EMERGENCY DEPARTMENT AT [facility] Provider Note   CSN: [id number] Arrival date & time: 04/27/23  2042     History  Chief Complaint  Patient presents with   Sarah [name] is a 77 y.o. female past medical history of pelvic fracture, high cholesterol, hypertension presents today after a fall this morning.  Patient states she did hit her head and her left shoulder.  She denies loss of consciousness or blood thinner use.  She endorses neck pain, mild headache.  She denies vision changes, loss of bowel or bladder, weakness.   Fall Associated symptoms include headaches.       Home Medications Prior to Admission medications   Medication Sig Start Date End Date Taking? Authorizing Provider  amLODipine (NORVASC) 2.5 MG tablet TAKE 1 TABLET(2.5 MG) BY MOUTH DAILY 11/24/22   Dickie La, MD  atorvastatin (LIPITOR) 40 MG tablet Take 1 tablet (40 mg total) by mouth daily. 11/25/22 11/25/23  Dickie La, MD  buPROPion (WELLBUTRIN XL) 150 MG 24 hr tablet Take 1 tablet (150 mg total) by mouth every morning. 11/24/22 11/24/23  Dickie La, MD  busPIRone (BUSPAR) 5 MG tablet Take 2 tablets (10 mg total) by mouth 3 (three) times daily as needed. 03/27/23   Rocky Morel, DO  gabapentin (NEURONTIN) 300 MG capsule TAKE 2 CAPSULE BY MOUTH IN THE MORNING, ONE CAPSULE BY MOUTH IN THE AFTERNOON AND 2 CAPSULES AT BEDTIME 11/24/22   Dickie La, MD  hydrochlorothiazide (HYDRODIURIL) 12.5 MG tablet TAKE 1 TABLET(12.5 MG) BY MOUTH DAILY 11/24/22   Dickie La, MD  Menthol-Methyl Salicylate (MUSCLE RUB) 10-15 % CREA Apply 1 application topically as needed for muscle pain. Patient not taking: Reported on 04/27/2023 04/11/20   Quincy Simmonds, MD  sertraline (ZOLOFT) 100 MG tablet Take 1 tablet (100 mg total) by mouth daily. 02/10/23 02/05/24  Dickie La, MD  sertraline (ZOLOFT) 50 MG tablet Take 1 tablet (50 mg total) by mouth daily. Take in addition to 100 mg tablet for a total of sertraline  150 mg daily. 03/31/23   Dickie La, MD  Vitamin D, Cholecalciferol, 25 MCG (1000 UT) CAPS Take 1 capsule by mouth daily. 11/25/22   Dickie La, MD      Allergies    Patient has no known allergies.    Review of Systems   Review of Systems  Musculoskeletal:  Positive for arthralgias.  Neurological:  Positive for headaches. Negative for syncope.    Physical Exam Updated Vital Signs BP 119/66 (BP Location: Left Arm)   Pulse 78   Temp 97.7 F (36.5 C) (Oral)   Resp 17   Ht 5\' 4"  (1.626 m)   Wt 96.2 kg   SpO2 100%   BMI 36.39 kg/m  Physical Exam Vitals and nursing note reviewed.  Constitutional:      General: She is not in acute distress.    Appearance: She is well-developed. She is obese.  HENT:     Head: Normocephalic and atraumatic.     Right Ear: External ear normal.     Left Ear: External ear normal.     Nose: Nose normal.     Mouth/Throat:     Mouth: Mucous membranes are moist.  Eyes:     Conjunctiva/sclera: Conjunctivae normal.  Cardiovascular:     Rate and Rhythm: Normal rate and regular rhythm.     Pulses: Normal pulses.     Heart sounds: No murmur heard. Pulmonary:     Effort:  Pulmonary effort is normal. No respiratory distress.     Breath sounds: Normal breath sounds.  Abdominal:     Palpations: Abdomen is soft.     Tenderness: There is no abdominal tenderness.  Musculoskeletal:     Left shoulder: Tenderness present. No swelling or deformity.     Left upper arm: Tenderness present. No swelling or deformity.     Cervical back: Neck supple. Spinous process tenderness present.     Comments: Midline tenderness of C-spine.  No step-offs or obvious deformities.  Skin:    General: Skin is warm and dry.     Capillary Refill: Capillary refill takes less than 2 seconds.  Neurological:     General: No focal deficit present.     Mental Status: She is alert.  Psychiatric:        Mood and Affect: Mood normal.     ED Results / Procedures / Treatments    Labs (all labs ordered are listed, but only abnormal results are displayed) Labs Reviewed - No data to display  EKG None  Radiology No results found.  Procedures Procedures    Medications Ordered in ED Medications - No data to display  ED Course/ Medical Decision Making/ A&P                                 Medical Decision Making Amount and/or Complexity of Data Reviewed Radiology: ordered.  This patient presents to the ED with chief complaint(s) of fall with pertinent past medical history of hip fracture which further complicates the presenting complaint. The complaint involves an extensive differential diagnosis and also carries with it a high risk of complications and morbidity.    The differential diagnosis includes neck fracture, arm fracture, musculoskeletal pain, concussion  Additional history obtained: Records reviewed previous imaging  ED Course and Reassessment:   Independent visualization of imaging: - I independently visualized the following imaging with scope of interpretation limited to determining acute life threatening conditions related to emergency care: CT C-spine without contrast, which revealed pending Left clavicle x-ray: Pending Left humerus x-ray pending: Left shoulder x-ray: Pending  Consultation: - Consulted or discussed management/test interpretation w/ external professional: None  Consideration for admission or further workup:  Patient signed out to PA Roemhildt at shift change         Final Clinical Impression(s) / ED Diagnoses Final diagnoses:  None    Rx / DC Orders ED Discharge Orders     None         Dolphus Jenny, PA-C 04/27/23 2350    Glyn Ade, MD 04/30/23 [phone]

## 2023-04-27 NOTE — ED Notes (Signed)
C-Collar placed in triage

## 2023-04-27 NOTE — ED Triage Notes (Signed)
Fall on Sunday morning, states she fell on right side. Pt did hit her head. No LOC or blood thinners. Pt states she has pain all over and she has neck pain.

## 2023-04-28 MED ORDER — ACETAMINOPHEN 500 MG PO TABS
1000.0000 mg | ORAL_TABLET | Freq: Once | ORAL | Status: AC
  Administered 2023-04-28: 1000 mg via ORAL
  Filled 2023-04-28: qty 2

## 2023-04-28 NOTE — Discharge Instructions (Addendum)
You are seen emergency department after a fall.  As we discussed the imaging of your neck and your arm was all reassuring.  We do not see any evidence of broken bones or deeper internal injury.  Please use acetaminophen (Tylenol) or ibuprofen (Advil, Motrin) for pain.  You may use 800 mg ibuprofen every 6 hours or 1000 mg of acetaminophen every 6 hours.  You may choose to alternate between the two, this would be most effective. Do not exceed 4000 mg of acetaminophen within 24 hours.  Do not exceed 3200 mg ibuprofen within 24 hours.  Continue to monitor how you're doing and return to the ER for new or worsening symptoms.

## 2023-04-29 ENCOUNTER — Telehealth: Payer: Self-pay | Admitting: *Deleted

## 2023-04-29 ENCOUNTER — Encounter: Payer: Self-pay | Admitting: *Deleted

## 2023-04-29 NOTE — Patient Outreach (Signed)
  Care Coordination   04/29/2023 Name: Sarah [name] MRN: [id number] DOB: July 15, 1945   Care Coordination Outreach Attempts:  An unsuccessful telephone outreach was attempted today to offer the patient information about available care coordination services.  Follow Up Plan:  Additional outreach attempts will be made to offer the patient care coordination information and services.   Encounter Outcome:  No Answer   Care Coordination Interventions:  No, not indicated    Reece Levy, MSW, LCSW Clinical Social Worker [phone]

## 2023-05-01 ENCOUNTER — Telehealth: Payer: Self-pay | Admitting: *Deleted

## 2023-05-01 NOTE — Progress Notes (Unsigned)
  Care Coordination Note  05/01/2023 Name: Sarah [name] MRN: [id number] DOB: 1946/01/21  Sarah [name] is a 77 y.o. year old female who is a primary care patient of Dickie La, MD and is actively engaged with the care management team. I reached out to Sarah [name] by phone today to assist with re-scheduling a follow up visit with the Licensed Clinical Social Worker  Follow up plan: Unsuccessful telephone outreach attempt made. A HIPAA compliant phone message was left for the patient providing contact information and requesting a return call.   Burman Nieves, CCMA Care Coordination Care Guide Direct Dial: [phone]

## 2023-05-04 NOTE — Progress Notes (Signed)
  Care Coordination Note  05/04/2023 Name: Sarah [name] MRN: [id number] DOB: Nov 16, 1945  Sarah [name] is a 77 y.o. year old female who is a primary care patient of Dickie La, MD and is actively engaged with the care management team. I reached out to Sarah [name] by phone today to assist with re-scheduling a follow up visit with the Licensed Clinical Social Worker  Follow up plan: Telephone appointment with care management team member scheduled for: 05/07/2023  Burman Nieves, [facility] Care Coordination Care Guide Direct Dial: [phone]

## 2023-05-05 ENCOUNTER — Telehealth: Payer: Self-pay | Admitting: *Deleted

## 2023-05-05 ENCOUNTER — Encounter: Payer: Medicare HMO | Admitting: Internal Medicine

## 2023-05-05 NOTE — Telephone Encounter (Signed)
I was asked to call pt about c/o having a cold. No answer; left message to call the office.

## 2023-05-07 ENCOUNTER — Encounter: Payer: Self-pay | Admitting: *Deleted

## 2023-05-07 ENCOUNTER — Other Ambulatory Visit: Payer: Self-pay | Admitting: Internal Medicine

## 2023-05-07 DIAGNOSIS — F322 Major depressive disorder, single episode, severe without psychotic features: Secondary | ICD-10-CM

## 2023-05-07 DIAGNOSIS — E785 Hyperlipidemia, unspecified: Secondary | ICD-10-CM

## 2023-05-07 DIAGNOSIS — E559 Vitamin D deficiency, unspecified: Secondary | ICD-10-CM

## 2023-05-07 DIAGNOSIS — F332 Major depressive disorder, recurrent severe without psychotic features: Secondary | ICD-10-CM

## 2023-05-07 DIAGNOSIS — G8929 Other chronic pain: Secondary | ICD-10-CM

## 2023-05-07 DIAGNOSIS — I10 Essential (primary) hypertension: Secondary | ICD-10-CM

## 2023-05-07 MED ORDER — GABAPENTIN 300 MG PO CAPS
ORAL_CAPSULE | ORAL | 1 refills | Status: DC
Start: 2023-05-07 — End: 2023-12-02

## 2023-05-07 NOTE — Telephone Encounter (Signed)
Pt was called - no answer; left message on self-identified vm to call the office.

## 2023-05-07 NOTE — Telephone Encounter (Signed)
  gabapentin (NEURONTIN) 300 MG capsule  amLODipine (NORVASC) 2.5 MG tablet   sertraline (ZOLOFT) 100 MG tablet  sertraline (ZOLOFT) 50 MG tablet  Vitamin D, Cholecalciferol, 25 MCG (1000 UT) CAPS   atorvastatin (LIPITOR) 40 MG tablet    [id number] - [place], [place] - [address] AT [facility] [address] (Ph: [phone])

## 2023-05-07 NOTE — Telephone Encounter (Signed)
I talked  to pt who stated she did not realized she had refills at [facility]. Informed pt Gabapentin will be sent to [facility] as well.

## 2023-05-08 ENCOUNTER — Telehealth: Payer: Self-pay | Admitting: *Deleted

## 2023-05-08 NOTE — Patient Outreach (Signed)
  Care Coordination   05/08/2023 Name: MALONE SCOBY MRN: [id number] DOB: 1945/07/21   Care Coordination Outreach Attempts:  A third unsuccessful outreach was attempted today to offer the patient with information about available care coordination services.  Follow Up Plan:  No further outreach attempts will be made at this time. We have been unable to contact the patient to offer or enroll patient in care coordination services  Encounter Outcome:  No Answer   Care Coordination Interventions:  No, not indicated    Reece Levy, MSW, LCSW [facility]  [facility], [facility] Licensed Clinical Social Worker Care Coordinator  [phone]

## 2023-05-20 ENCOUNTER — Telehealth: Payer: Self-pay

## 2023-05-20 NOTE — Telephone Encounter (Signed)
Return call to [facility] pharmacy; talked to [facility], pharmacist. Informed pt is currently on Atorvastatin 40 mg (lipid done 8/1/240); she will deactivate the 20 mg they have on file.

## 2023-05-20 NOTE — Telephone Encounter (Signed)
[facility]  about atorvastatin  she wants to know if pt is to be on the 40 or 20     name  Sarah [name]  [phone]

## 2023-05-25 ENCOUNTER — Ambulatory Visit: Payer: Medicare HMO | Admitting: Student

## 2023-05-25 VITALS — BP 148/67 | HR 68 | Temp 98.1°F | Ht 64.0 in | Wt 219.4 lb

## 2023-05-25 DIAGNOSIS — F332 Major depressive disorder, recurrent severe without psychotic features: Secondary | ICD-10-CM | POA: Diagnosis not present

## 2023-05-25 DIAGNOSIS — I10 Essential (primary) hypertension: Secondary | ICD-10-CM | POA: Diagnosis not present

## 2023-05-25 DIAGNOSIS — F322 Major depressive disorder, single episode, severe without psychotic features: Secondary | ICD-10-CM

## 2023-05-25 MED ORDER — SERTRALINE HCL 50 MG PO TABS
50.0000 mg | ORAL_TABLET | Freq: Every day | ORAL | 1 refills | Status: DC
Start: 2023-05-25 — End: 2023-08-26

## 2023-05-25 MED ORDER — SERTRALINE HCL 100 MG PO TABS: 100.0000 mg | ORAL_TABLET | Freq: Every day | ORAL | 3 refills | Status: DC

## 2023-05-25 NOTE — Patient Instructions (Addendum)
Thank you so much for coming to the clinic today!   The number for the counselor is: [facility] : [phone]. Please give them a call whenever is convenient for you.   The number for the physical therapist is [phone], please also give them a call at your convenience.   The reason for the referral to the orthopedic surgeon as well as the physical therapist in 2021 was due to a pelvic fracture.   If you have any questions please feel free to the call the clinic at anytime at [phone]. It was a pleasure seeing you!  Best, Dr. Thomasene Ripple

## 2023-05-26 NOTE — Assessment & Plan Note (Signed)
Pt presents for follow up regarding her severe recurrent depression. Her current regimen is Sertraline 150mg  daily, bupropion 150mg  once a day, and buspirone 10mg  TID PRN. She states her regimen is doing ok with her, however recently she has had another death in the family (her niece), which she states has taken another toll on her like when she lost her husband. She still endorses low mood and energy. She has not been able to reach out to Chief Financial Officer.   Overall, pt is on high dosage SSRI, Wellbutrin, and buspirone PRN, so she may be able to increase her medication at some point if necessary. For now, I believe she would benefit from counseling and talk therapy in adjunct to her medication. Provided number for counseling to patient, and she will follow up in one month.   Plan:  - Continue sertraline 150mg   - Continue Bupropion 150mg  once a day, and buspirone 10mg  TID PRN  - Number given for counselor
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
## 2023-05-26 NOTE — Assessment & Plan Note (Signed)
Blood pressure elevated in clinic today, 154/64 to 148/67 on recheck. Her regimen is amlodipine 2.5mg , and hydrochlorothiazide 12.5mg , which she did take both on morning of encounter. Will follow up at next visit, as her blood pressure has been stable on this regimen, however if still elevated in one month can increase amlodipine.

## 2023-05-28 NOTE — Progress Notes (Signed)
Internal Medicine Clinic Attending  Case discussed with the resident at the time of the visit.  We reviewed the resident's history and exam and pertinent patient test results.  I agree with the assessment, diagnosis, and plan of care documented in the resident's note.  

## 2023-07-28 ENCOUNTER — Encounter: Payer: Self-pay | Admitting: Internal Medicine

## 2023-07-28 ENCOUNTER — Ambulatory Visit: Payer: Medicare HMO | Admitting: Internal Medicine

## 2023-07-28 VITALS — BP 137/67 | HR 68 | Temp 98.2°F | Ht 64.0 in | Wt 224.2 lb

## 2023-07-28 DIAGNOSIS — I10 Essential (primary) hypertension: Secondary | ICD-10-CM

## 2023-07-28 DIAGNOSIS — M25562 Pain in left knee: Secondary | ICD-10-CM

## 2023-07-28 DIAGNOSIS — E785 Hyperlipidemia, unspecified: Secondary | ICD-10-CM | POA: Diagnosis not present

## 2023-07-28 DIAGNOSIS — M67814 Other specified disorders of tendon, left shoulder: Secondary | ICD-10-CM

## 2023-07-28 DIAGNOSIS — F332 Major depressive disorder, recurrent severe without psychotic features: Secondary | ICD-10-CM

## 2023-07-28 DIAGNOSIS — G8929 Other chronic pain: Secondary | ICD-10-CM | POA: Diagnosis not present

## 2023-07-28 DIAGNOSIS — Z87898 Personal history of other specified conditions: Secondary | ICD-10-CM

## 2023-07-28 DIAGNOSIS — M25561 Pain in right knee: Secondary | ICD-10-CM | POA: Diagnosis not present

## 2023-07-28 DIAGNOSIS — E559 Vitamin D deficiency, unspecified: Secondary | ICD-10-CM | POA: Diagnosis not present

## 2023-07-28 DIAGNOSIS — Z653 Problems related to other legal circumstances: Secondary | ICD-10-CM

## 2023-07-28 MED ORDER — DICLOFENAC SODIUM 1 % EX GEL
4.0000 g | Freq: Four times a day (QID) | CUTANEOUS | 1 refills | Status: DC
Start: 2023-07-28 — End: 2024-05-23

## 2023-07-28 NOTE — Progress Notes (Signed)
 Established Patient Office Visit  Subjective   Patient ID: Sarah [name], female    DOB: Oct 06, 1945  Age: 78 y.o. MRN: [id number]  Chief Complaint  Patient presents with   F/U Fall    C/o left arm and leg ain. Feet slightly swollen.    Sarah [name] returns to clinic today to discuss left shoulder and leg pain following a fall in December. She does not remember the exact date of the fall. Mentions that she tripped on her chair, which has also happened once before, causing the fall. She denied any chest pain, dizziness/lightheadedness, confusion or other symptoms preceding the event. She denied loss of consciousness. Since that time, her left shoulder has been hurting. She has been taking Aleve , one tablet every 12 hours. She has some discomfort of the left leg as well, mostly knee. Some left lower extremity swelling noted, although also mentions this has been ongoing since prior surgery for tibial fracture. Please see assessment/plan in problem-based charting for further details of today's visit.    Patient Active Problem List   Diagnosis Date Noted   History of prediabetes 07/28/2023   Obesity, morbid (HCC) 03/03/2023   Other skin changes 03/03/2023   PAC (premature atrial contraction) 02/10/2023   Chronic pain of both knees 11/25/2022   Tendinosis of left rotator cuff 09/15/2022   Cholelithiases 05/31/2020   Uterine fibroid 05/31/2020   OSA (obstructive sleep apnea) 06/15/2018   Severe recurrent major depression (HCC) 05/07/2016   Vitamin D  deficiency 09/18/2015   Essential hypertension, benign 10/08/2012   Health care maintenance 07/09/2012   Hyperlipidemia 06/16/2011      Objective:     BP 137/67 (BP Location: Left Arm, Patient Position: Sitting, Cuff Size: Normal)   Pulse 68   Temp 98.2 F (36.8 C) (Oral)   Ht 5' 4 (1.626 m)   Wt 224 lb 3.2 oz (101.7 kg)   SpO2 100% Comment: RA  BMI 38.48 kg/m  BP Readings from Last 3 Encounters:  07/28/23 137/67  05/25/23 (!)  148/67  04/28/23 128/68   Wt Readings from Last 3 Encounters:  07/28/23 224 lb 3.2 oz (101.7 kg)  05/25/23 219 lb 6.4 oz (99.5 kg)  04/27/23 212 lb (96.2 kg)     Physical Exam Vitals and nursing note reviewed.  Constitutional:      General: She is not in acute distress.    Appearance: Normal appearance. She is not ill-appearing.  Musculoskeletal:        General: Swelling (trace lower extremity bilaterally, L > R) present.     Comments: Left shoulder with reduced active and passive ROM. Pain with abduction. Weakness with resisted forward flexion, external and internal rotation at 90 degrees. Pain with palpation of anterior and posterior shoulder. No deformity.  TTP over lateral left hip, thigh, and knee. No deformity noted. Able to rise from her chair and ambulate without assistance.  Neurological:     General: No focal deficit present.     Mental Status: She is alert and oriented to person, place, and time.     Comments: Able to ambulate without assistance. Mild antalgic gait.  Psychiatric:        Mood and Affect: Mood normal.        Behavior: Behavior normal.        Thought Content: Thought content normal.      Assessment & Plan:   Problem List Items Addressed This Visit       Cardiovascular and Mediastinum  Essential hypertension, benign (Chronic)   Blood pressure near goal today at 137/67. Denies chest pain, SOB, lightheadedness, syncope. No change to medications at this time.  Plan -Continue amlodipine  2.5 mg and hydrochlorothiazide  12.5 mg daily -BMP        Relevant Orders   BMP8+Anion Gap   Amb Referral To Provider Referral Exercise Program (P.R.E.P)     Endocrine   History of prediabetes   A1c 6.0% in 2014. Repeat in 2016 wnl. Repeat today.  Plan -A1c -Continue healthy nutrition and activity      Relevant Orders   Hemoglobin A1c     Musculoskeletal and Integument   Tendinosis of left rotator cuff - Primary   Chronic left shoulder pain,  previously seen by orthopedics and sports medicine, recently worsened by fall onto the left side. At last visit with sports medicine in May 2024, there was concern for possible full thickness rotator cuff tear. She has been referred to PT in the past but I do not see that this was completed. Exam today concerning for ongoing rotator cuff tendinopathy, possible tear although ROM and strength exam limited due to pain. I do not have a high suspicion for fracture. She has been taking naproxen  for pain relief, which we again discussed wanting to avoid in the setting of possible early CKD. She is hesitant to consider steroid injections but I do think this would be reasonable and she is willing to f/u with sports medicine. Referral sent again today. PT referral sent.   Plan -Referral to sports medicine for consideration of steroid injection -Referral to PT -Avoid NSAIDs, start Tylenol  and Voltaren  gel      Relevant Medications   diclofenac  Sodium (VOLTAREN  ARTHRITIS PAIN) 1 % GEL   Other Relevant Orders   Ambulatory referral to Physical Therapy   Ambulatory referral to Sports Medicine     Other   Hyperlipidemia   Remains on atorvastatin  40 mg. LDL 99 at July visit.  Plan -Lipid profile -Continue atorvastatin  40 mg daily      Relevant Orders   Lipid Profile   Vitamin D  deficiency   Resumed vitamin D  last year due to evidence of deficiency. She has been taking this medication daily.  Plan -Vit D level -Continue 1000 international units  daily      Relevant Orders   Vitamin D  (25 hydroxy)   Severe recurrent major depression (HCC)   Treated with sertraline  150 mg daily, bupropion  150 mg daily, and buspirone  10 mg tid prn. Sarah [name] thinks she has been out of one of these medications but not sure which one. She is planning to call her pharmacy to check after today's visit. Overall, her mood is similar. She continues to note low motivation and energy. She does not get out much. She does express  interest in starting exercise today and is open to referral to P.R.E.P which I think would be great for overall health as well as mood and socialization. She would like to establish with a counselor but has not called yet to establish.  Plan -Continue sertraline  150 mg, bupropion  150 mg, and buspirone  10 mg tid prn; call the clinic if refills are needed -Phone number provided for [facility] -Referral for exercise program      Chronic pain of both knees   Chronic bilateral knee pain, L > R. Unfortunately, Ms. Streetman has had a mechanical fall causing worsening of her left knee pain. Low suspicion for fracture. Mild antalgic gait, otherwise normal without assistance.  We have again discussed physical therapy and referral was placed. She did not receive an injection at last sports medicine visit but I do feel she could benefit from this.  Plan -Referral to sports medicine, consider steroid injection -Referral to PT -Avoid NSAIDs, Tylenol  and Voltaren  gel okay -Referral for exercise program      Obesity, morbid (HCC)   Obesity with BMI of 38, associated with serious comorbidity of hyperlipidemia, hypertension, and osteoarthritis. Previously referred to P.R.E.P, however Ms. Littlefield reports she is interested again today. I have stressed the importance of answering her phone calls.  Plan -Referral to P.R.E.P      Relevant Orders   Amb Referral To Provider Referral Exercise Program (P.R.E.P)   Other Visit Diagnoses       Legal insurance problem       Relevant Orders   AMB Referral [facility] Care Management       Return in about 3 months (around 10/26/2023) for fu.    Ronnald Sergeant, MD

## 2023-07-28 NOTE — Patient Instructions (Addendum)
 It was wonderful to see you today!  You may take Tylenol  Extra Strength (500 mg) 2 tablets every 8 hours for pain. I have also sent a gel that you will put on your shoulder four times a day. The sports medicine team will call you to set up an appointment to talk about shoulder injections. Physical therapy will call to set up an appointment.  The number for the counselor is [facility] : [phone]   Our social work team will call you to help with insurance/paperwork. Please make sure to answer your phone even if you do not recognize the number!!  Please contact your pharmacy about scheduling the shingles (Shingrix) and tetanus (Tdap) vaccines.

## 2023-07-28 NOTE — Assessment & Plan Note (Signed)
 Resumed vitamin D last year due to evidence of deficiency. She has been taking this medication daily.  Plan -Vit D level -Continue 1000 international units  daily

## 2023-07-28 NOTE — Assessment & Plan Note (Signed)
 Treated with sertraline  150 mg daily, bupropion  150 mg daily, and buspirone  10 mg tid prn. Sarah [name] thinks she has been out of one of these medications but not sure which one. She is planning to call her pharmacy to check after today's visit. Overall, her mood is similar. She continues to note low motivation and energy. She does not get out much. She does express interest in starting exercise today and is open to referral to P.R.E.P which I think would be great for overall health as well as mood and socialization. She would like to establish with a counselor but has not called yet to establish.  Plan -Continue sertraline  150 mg, bupropion  150 mg, and buspirone  10 mg tid prn; call the clinic if refills are needed -Phone number provided for [facility] -Referral for exercise program

## 2023-07-28 NOTE — Assessment & Plan Note (Signed)
 Obesity with BMI of 38, associated with serious comorbidity of hyperlipidemia, hypertension, and osteoarthritis. Previously referred to P.R.E.P, however Ms. Roszak reports she is interested again today. I have stressed the importance of answering her phone calls.  Plan -Referral to P.R.E.P

## 2023-07-28 NOTE — Assessment & Plan Note (Signed)
 Blood pressure near goal today at 137/67. Denies chest pain, SOB, lightheadedness, syncope. No change to medications at this time.  Plan -Continue amlodipine 2.5 mg and hydrochlorothiazide 12.5 mg daily -BMP

## 2023-07-28 NOTE — Assessment & Plan Note (Signed)
 Chronic bilateral knee pain, L > R. Unfortunately, Ms. Tolan has had a mechanical fall causing worsening of her left knee pain. Low suspicion for fracture. Mild antalgic gait, otherwise normal without assistance. We have again discussed physical therapy and referral was placed. She did not receive an injection at last sports medicine visit but I do feel she could benefit from this.  Plan -Referral to sports medicine, consider steroid injection -Referral to PT -Avoid NSAIDs, Tylenol  and Voltaren  gel okay -Referral for exercise program

## 2023-07-28 NOTE — Assessment & Plan Note (Signed)
 Remains on atorvastatin 40 mg. LDL 99 at July visit.  Plan -Lipid profile -Continue atorvastatin 40 mg daily

## 2023-07-28 NOTE — Assessment & Plan Note (Signed)
 Chronic left shoulder pain, previously seen by orthopedics and sports medicine, recently worsened by fall onto the left side. At last visit with sports medicine in May 2024, there was concern for possible full thickness rotator cuff tear. She has been referred to PT in the past but I do not see that this was completed. Exam today concerning for ongoing rotator cuff tendinopathy, possible tear although ROM and strength exam limited due to pain. I do not have a high suspicion for fracture. She has been taking naproxen  for pain relief, which we again discussed wanting to avoid in the setting of possible early CKD. She is hesitant to consider steroid injections but I do think this would be reasonable and she is willing to f/u with sports medicine. Referral sent again today. PT referral sent.   Plan -Referral to sports medicine for consideration of steroid injection -Referral to PT -Avoid NSAIDs, start Tylenol  and Voltaren  gel

## 2023-07-28 NOTE — Assessment & Plan Note (Addendum)
 A1c 6.0% in 2014. Repeat in 2016 wnl. Repeat today.  Plan -A1c -Continue healthy nutrition and activity

## 2023-07-29 LAB — BMP8+ANION GAP
Anion Gap: 16 mmol/L (ref 10.0–18.0)
BUN/Creatinine Ratio: 11 — ABNORMAL LOW (ref 12–28)
BUN: 11 mg/dL (ref 8–27)
CO2: 25 mmol/L (ref 20–29)
Calcium: 9.4 mg/dL (ref 8.7–10.3)
Chloride: 102 mmol/L (ref 96–106)
Creatinine, Ser: 1.04 mg/dL — ABNORMAL HIGH (ref 0.57–1.00)
Glucose: 83 mg/dL (ref 70–99)
Potassium: 4.3 mmol/L (ref 3.5–5.2)
Sodium: 143 mmol/L (ref 134–144)
eGFR: 56 mL/min/{1.73_m2} — ABNORMAL LOW (ref >59)

## 2023-07-29 LAB — LIPID PANEL
Chol/HDL Ratio: 3.1 {ratio} (ref 0.0–4.4)
Cholesterol, Total: 202 mg/dL — ABNORMAL HIGH (ref 100–199)
HDL: 66 mg/dL (ref >39)
LDL Chol Calc (NIH): 119 mg/dL — ABNORMAL HIGH (ref 0–99)
Triglycerides: 94 mg/dL (ref 0–149)
VLDL Cholesterol Cal: 17 mg/dL (ref 5–40)

## 2023-07-29 LAB — VITAMIN D 25 HYDROXY (VIT D DEFICIENCY, FRACTURES): Vit D, 25-Hydroxy: 34.2 ng/mL (ref 30.0–100.0)

## 2023-07-29 LAB — HEMOGLOBIN A1C
Est. average glucose Bld gHb Est-mCnc: 111 mg/dL
Hgb A1c MFr Bld: 5.5 % (ref 4.8–5.6)

## 2023-07-29 NOTE — Progress Notes (Signed)
 Mild decrease in kidney function for the last ~8 months, consistent with CKD3a. Attempted to discuss with Sarah [name] 1/15, not available. Will need ACR at f/u visit.

## 2023-07-29 NOTE — Progress Notes (Signed)
 Continue vitamin D  supplementation, 1000 international units  daily.

## 2023-07-29 NOTE — Progress Notes (Signed)
 ASCVD risk 19.1% on atorvastatin  20 mg daily. Will discuss increase to 40 mg daily with Ms. Nazir, unable to reach 1/15.

## 2023-07-29 NOTE — Progress Notes (Signed)
 A1c wnl

## 2023-08-04 ENCOUNTER — Telehealth: Payer: Self-pay | Admitting: *Deleted

## 2023-08-04 ENCOUNTER — Ambulatory Visit: Payer: Self-pay | Admitting: Licensed Clinical Social Worker

## 2023-08-04 NOTE — Patient Instructions (Signed)
Visit Information  Thank you for taking time to visit with me today. Please don't hesitate to contact me if I can be of assistance to you.   Following are the goals we discussed today:   Goals Addressed             This Visit's Progress    Care Coordination       Care Coordination Interventions: Reviewed all upcoming appointments in Epic system Active listening / Reflection utilized  Emotional Support Provided Problem Solving /Task Center strategies reviewed Gather Information needed for insurance claims - fax numbers to send information to Contact medical records with information requests          Our next appointment is by telephone on 08/18/2023.  Please call the care guide team at [phone] if you need to cancel or reschedule your appointment.   If you are experiencing a Mental Health or Behavioral Health Crisis or need someone to talk to, please call the Suicide and Crisis Lifeline: 988  Patient verbalizes understanding of instructions and care plan provided today and agrees to view in MyChart. Active MyChart status and patient understanding of how to access instructions and care plan via MyChart confirmed with patient.     Telephone follow up appointment with care management team member scheduled for: 08/18/2023

## 2023-08-04 NOTE — Progress Notes (Signed)
Complex Care Management Note Care Guide Note  08/04/2023 Name: Sarah [name] MRN: [id number] DOB: 03/20/1946   Complex Care Management Outreach Attempts: An unsuccessful telephone outreach was attempted today to offer the patient information about available complex care management services.  Follow Up Plan:  Additional outreach attempts will be made to offer the patient complex care management information and services.   Encounter Outcome:  No Answer  Gwenevere Ghazi  [facility]  [facility], [facility] Guide  Direct Dial: [phone]  Fax [phone]

## 2023-08-04 NOTE — Progress Notes (Signed)
Complex Care Management Note  Care Guide Note 08/04/2023 Name: Sarah [name] MRN: [id number] DOB: [birth year]  Sarah [name] is a 78 y.o. year old female who sees Dickie La, MD for primary care. I reached out to Sarah [name] by phone today to offer complex care management services.  [name] was given information about Complex Care Management services today including:   The Complex Care Management services include support from the care team which includes your Nurse Coordinator, Clinical Social Worker, or Pharmacist.  The Complex Care Management team is here to help remove barriers to the health concerns and goals most important to you. Complex Care Management services are voluntary, and the patient may decline or stop services at any time by request to their care team member.   Complex Care Management Consent Status: Patient agreed to services and verbal consent obtained.   Follow up plan:  Telephone appointment with complex care management team member scheduled for:  1/21  Encounter Outcome:  Patient Scheduled  Gwenevere Ghazi  [facility]  [facility], [facility] Guide  Direct Dial: [phone]  Fax [phone]
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
## 2023-08-06 ENCOUNTER — Telehealth: Payer: Self-pay | Admitting: Internal Medicine

## 2023-08-06 NOTE — Telephone Encounter (Signed)
Unable to reach Sarah [name] 1/21 or 1/23 for review of labs from 1/14. I do she spoke with LCWS on 1/21. Will try to reach again.

## 2023-08-10 ENCOUNTER — Ambulatory Visit: Payer: No Typology Code available for payment source | Admitting: Physical Therapy

## 2023-08-12 ENCOUNTER — Telehealth: Payer: Self-pay

## 2023-08-12 ENCOUNTER — Telehealth: Payer: Self-pay | Admitting: Internal Medicine

## 2023-08-12 NOTE — Telephone Encounter (Signed)
Unable to reach Sarah [name] to review labs. She has canceled her appointment with PT 1/27.

## 2023-08-12 NOTE — Telephone Encounter (Signed)
Pt calling back requesting to speak with Dr. Sol Blazing.

## 2023-08-12 NOTE — Telephone Encounter (Addendum)
Returned patient's call. Reviewed labs from 1/14. Discussed CKD and need for good BP control, avoid NSAIDs. Plan to repeat at next visit with urine, ACR. Will increase atorvastatin from 20 mg to 40 mg daily, confirmed 20 mg is what she has at home. Spoke with pharmacy and they have active 40 mg prescription on file. Left shoulder and left knee pain improving. Phone number provided to patient to return call to sports med to make an appointment. She is not currently able to afford PT copay. Working with [employer] for insurance paperwork issues, f/u 2/4.

## 2023-08-18 ENCOUNTER — Ambulatory Visit: Payer: Self-pay | Admitting: Licensed Clinical Social Worker

## 2023-08-18 ENCOUNTER — Ambulatory Visit: Payer: No Typology Code available for payment source | Admitting: Family Medicine

## 2023-08-18 VITALS — BP 122/60 | Ht 64.0 in | Wt 224.0 lb

## 2023-08-18 DIAGNOSIS — M67814 Other specified disorders of tendon, left shoulder: Secondary | ICD-10-CM

## 2023-08-18 NOTE — Patient Outreach (Signed)
  Care Coordination   08/18/2023 Name: Sarah [name] MRN: [id number] DOB: 1946-05-25   Care Coordination Outreach Attempts:  An unsuccessful telephone outreach was attempted today to offer the patient information about available complex care management services.  Follow Up Plan:  Additional outreach attempts will be made to offer the patient complex care management information and services.   Encounter Outcome:  No Answer   Care Coordination Interventions:  No, not indicated    Alm Armor, LCSW [place], [facility] Licensed Clinical Social Worker Care Coordinator [phone]
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
## 2023-08-20 ENCOUNTER — Encounter: Payer: Self-pay | Admitting: Family Medicine

## 2023-08-20 NOTE — Addendum Note (Signed)
 Addended by: Rodgers Clack on: 08/20/2023 04:52 PM   Modules accepted: Level of Service

## 2023-08-25 ENCOUNTER — Other Ambulatory Visit: Payer: Self-pay | Admitting: Student

## 2023-08-25 DIAGNOSIS — F332 Major depressive disorder, recurrent severe without psychotic features: Secondary | ICD-10-CM

## 2023-10-07 ENCOUNTER — Telehealth: Payer: Self-pay | Admitting: *Deleted

## 2023-10-07 ENCOUNTER — Ambulatory Visit: Payer: No Typology Code available for payment source

## 2023-10-07 VITALS — Ht 64.0 in | Wt 225.0 lb

## 2023-10-07 DIAGNOSIS — Z0000 Encounter for general adult medical examination without abnormal findings: Secondary | ICD-10-CM
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
## 2023-10-07 NOTE — Patient Instructions (Addendum)
 [name] , Thank you for taking time to come for your Medicare Wellness Visit. I appreciate your ongoing commitment to your health goals. Please review the following plan we discussed and let me know if I can assist you in the future.   Referrals/Orders/Follow-Ups/Clinician Recommendations: Yes; Keep maintaining your health by keeping your appointments with Dr. Dickie La and any specialists that you may see.  Call us if you need anything.  Have a great year!!!!  This is a list of the screening recommended for you and due dates:  Health Maintenance  Topic Date Due   Zoster (Shingles) Vaccine (1 of 2) Never done   Colon Cancer Screening  02/09/2023   COVID-19 Vaccine (3 - 2024-25 season) 03/15/2023   DTaP/Tdap/Td vaccine (2 - Td or Tdap) 08/31/2024   Medicare Annual Wellness Visit  10/06/2024   Pneumonia Vaccine  Completed   Flu Shot  Completed   DEXA scan (bone density measurement)  Completed   Hepatitis C Screening  Completed   HPV Vaccine  Aged Out    Advanced directives: (Declined) Advance directive discussed with you today. Even though you declined this today, please call our office should you change your mind, and we can give you the proper paperwork for you to fill out.  Next Medicare Annual Wellness Visit scheduled for next year: Yes

## 2023-10-07 NOTE — Telephone Encounter (Signed)
 Agree with evaluation at urgent care or ED given no clinic openings. Sarah [name] is due for 3 month f/u with me in April, no appointment. Please call to arrange. She has been referred to counseling at prior visits, will f/u at appointment.

## 2023-10-07 NOTE — Telephone Encounter (Signed)
 Call from patient has been have Diarrhea frequently.  States every thing she eats comes out as diarrhea.  No fever or abdominal pain has been doing for a few weeks.  Upset ad in teras .  After talking wit patient has agreed to go to the ER as there are no available appointments in the Clinics.  Spoke to patient about possible need for counseling as she has gone through a lot of things over the past couple of years. Was open to the idea of getting counseling

## 2023-10-08 ENCOUNTER — Other Ambulatory Visit: Payer: Self-pay | Admitting: Internal Medicine

## 2023-10-08 ENCOUNTER — Other Ambulatory Visit: Payer: Self-pay | Admitting: Student

## 2023-10-08 DIAGNOSIS — F332 Major depressive disorder, recurrent severe without psychotic features: Secondary | ICD-10-CM

## 2023-10-08 DIAGNOSIS — F322 Major depressive disorder, single episode, severe without psychotic features: Secondary | ICD-10-CM

## 2023-10-08 NOTE — Telephone Encounter (Signed)
 Called and spoke with pt. Appt has scheduled for 10/14/2023 @ 9:45.

## 2023-10-09 NOTE — Telephone Encounter (Signed)
 Medication last refilled 08/26/23 with 2 refills.

## 2023-10-14 ENCOUNTER — Ambulatory Visit: Admitting: Student

## 2023-10-14 VITALS — BP 133/58 | HR 61 | Temp 98.5°F | Ht 64.0 in | Wt 213.6 lb

## 2023-10-14 DIAGNOSIS — E559 Vitamin D deficiency, unspecified: Secondary | ICD-10-CM

## 2023-10-14 DIAGNOSIS — F322 Major depressive disorder, single episode, severe without psychotic features: Secondary | ICD-10-CM

## 2023-10-14 DIAGNOSIS — N6311 Unspecified lump in the right breast, upper outer quadrant: Secondary | ICD-10-CM | POA: Diagnosis not present

## 2023-10-14 DIAGNOSIS — F332 Major depressive disorder, recurrent severe without psychotic features: Secondary | ICD-10-CM

## 2023-10-14 DIAGNOSIS — M67814 Other specified disorders of tendon, left shoulder: Secondary | ICD-10-CM

## 2023-10-14 DIAGNOSIS — F32A Depression, unspecified: Secondary | ICD-10-CM

## 2023-10-14 MED ORDER — VITAMIN D (CHOLECALCIFEROL) 25 MCG (1000 UT) PO CAPS: 1.0000 | ORAL_CAPSULE | Freq: Every day | ORAL | 3 refills | Status: DC

## 2023-10-14 MED ORDER — SERTRALINE HCL 50 MG PO TABS
50.0000 mg | ORAL_TABLET | Freq: Every day | ORAL | 2 refills | Status: DC
Start: 2023-10-14 — End: 2023-12-02

## 2023-10-14 MED ORDER — BUSPIRONE HCL 5 MG PO TABS
5.0000 mg | ORAL_TABLET | Freq: Three times a day (TID) | ORAL | 1 refills | Status: DC | PRN
Start: 2023-10-14 — End: 2023-12-02
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
## 2023-10-14 NOTE — Assessment & Plan Note (Addendum)
 Patient notes that she has recently felt a breast mass on the right side intermittently.  Her last mammogram was in 2019, negative at that time.  Denies fever, chills, night sweats, or unexpected weight loss at this time.  On exam today, she has a breast mass palpated in the right upper quadrant.  The mass is relatively small and is mobile.  No axillary adenopathy is appreciated.  No skin changes or nipple discharge appreciated.  These findings were discussed with the patient, patient would be interested in diagnostic mammogram for further workup and evaluation.  Should this be cancerous, she would consider treatment. Plan: Orders placed diagnostic mammogram.

## 2023-10-14 NOTE — Patient Instructions (Addendum)
 Thank you, Ms.[name] for allowing [place] to provide your care today.  Referrals ordered today:   Referral Orders         AMB Referral VBCI Care Management         Ambulatory referral to Integrated Behavioral Health       I have ordered the following medication/changed the following medications:    Start the following medications: Meds ordered this encounter  Medications   busPIRone (BUSPAR) 5 MG tablet    Sig: Take 1 tablet (5 mg total) by mouth 3 (three) times daily as needed.    Dispense:  60 tablet    Refill:  1   sertraline (ZOLOFT) 50 MG tablet    Sig: Take 1 tablet (50 mg total) by mouth daily.    Dispense:  30 tablet    Refill:  2   Vitamin D, Cholecalciferol, 25 MCG (1000 UT) CAPS    Sig: Take 1 capsule by mouth daily.    Dispense:  90 capsule    Refill:  3      Follow up:  1 month  for close follow up multiple health topics   Please remember:   [facility] - [facility] Address: [address], [place], [place] [address] Phone: [phone]   We look forward to seeing you next time. Please call our clinic at [phone] if you have any questions or concerns. The best time to call is Monday-Friday from 9am-4pm, but there is someone available 24/7. If after hours or the weekend, call the main hospital number and ask for the Internal Medicine Resident On-Call. If you need medication refills, please notify your pharmacy one week in advance and they will send [place] a request.   Thank you for trusting me with your care. Wishing you the best!  Lovie Macadamia MD [facility]

## 2023-10-14 NOTE — Assessment & Plan Note (Addendum)
 Patient comes in with chief concern of low energy.  She says this is not a new issue for her, has been ongoing for some time.  She is tearful throughout the interview, and states she has many life stressors including multiple deaths in the family, familial issues, and loneliness.  She states that much of her issues revolve around her grief and loneliness.  At baseline she says she is a very positive and efficacious person.  She was very active, but now states she stays in the house many times with her blinds shut and does not leave the house. She otherwise denies chest pain, shortness of breath, fever, chills, night sweats.  She brings in her medications with her today, she is out of BuSpar and I have filled this medication for her.  She is also on Zoloft and bupropion.  She states that she does not like taking medications, forces herself to take her medications as she wants to get better.  At last visit, there was discussion about starting counseling, patient has not spoken with a counselor yet.  Plan: Provided patient with phone number for [facility] counseling as previously discussed [facility] referral placed for social work needs and possible counseling Integrated behavioral health referral placed for grief counseling/depression. Close follow-up with [place] 1 month to see how they are doing.

## 2023-10-15 ENCOUNTER — Telehealth: Payer: Self-pay | Admitting: *Deleted

## 2023-10-15 ENCOUNTER — Other Ambulatory Visit: Payer: Self-pay | Admitting: Student

## 2023-10-15 DIAGNOSIS — N6311 Unspecified lump in the right breast, upper outer quadrant: Secondary | ICD-10-CM

## 2023-10-15 NOTE — Progress Notes (Unsigned)
 Complex Care Management Care Guide Note  10/15/2023 Name: Sarah [name] MRN: [id number] DOB: Mar 06, 1946  Sarah [name] is a 78 y.o. year old female who is a primary care patient of Dickie La, MD and is actively engaged with the care management team. I reached out to Sarah [name] by phone today to assist with re-scheduling  with the Licensed Clinical Child psychotherapist.  Follow up plan: Unsuccessful telephone outreach attempt made. A HIPAA compliant phone message was left for the patient providing contact information and requesting a return call.  Gwenevere Ghazi  [facility]  [facility], [facility] Guide  Direct Dial: [phone]  Fax [phone]

## 2023-10-16 NOTE — Progress Notes (Signed)
 Complex Care Management Note  Care Guide Note 10/16/2023 Name: Sarah [name] MRN: [id number] DOB: 29-Jun-1946  [name] is a 78 y.o. year old female who sees Dickie La, MD for primary care. [name] called  by phone today to schedule for complex care management services.  [name] was given information about Complex Care Management services today including:   The Complex Care Management services include support from the care team which includes your Nurse Care Manager, Clinical Social Worker, or Pharmacist.  The Complex Care Management team is here to help remove barriers to the health concerns and goals most important to you. Complex Care Management services are voluntary, and the patient may decline or stop services at any time by request to their care team member.   Complex Care Management Consent Status: Patient agreed to services and verbal consent obtained.   Follow up plan:  Telephone appointment with complex care management team member scheduled for:  4/17  Encounter Outcome:  Patient Scheduled  Gwenevere Ghazi  [facility]  [facility], [facility] Guide  Direct Dial: [phone]  Fax [phone]

## 2023-10-16 NOTE — Progress Notes (Signed)
 Complex Care Management Note Care Guide Note  10/16/2023 Name: Sarah [name] MRN: [id number] DOB: 1946/06/21   Complex Care Management Outreach Attempts: A second unsuccessful outreach was attempted today to offer the patient with information about available complex care management services.  Follow Up Plan:  Additional outreach attempts will be made to offer the patient complex care management information and services.   Encounter Outcome:  No Answer  Sig Gwenevere Ghazi  [facility]  [facility], [facility] Guide  Direct Dial: [phone]  Fax [phone]

## 2023-10-24 NOTE — Progress Notes (Signed)
 Internal Medicine Clinic Attending  Case discussed with the resident at the time of the visit.  We reviewed the resident's history and exam and pertinent patient test results.  I agree with the assessment, diagnosis, and plan of care documented in the resident's note.

## 2023-10-25 DIAGNOSIS — M25562 Pain in left knee: Secondary | ICD-10-CM | POA: Diagnosis not present

## 2023-10-25 DIAGNOSIS — Z602 Problems related to living alone: Secondary | ICD-10-CM | POA: Diagnosis not present

## 2023-10-25 DIAGNOSIS — M25512 Pain in left shoulder: Secondary | ICD-10-CM | POA: Diagnosis not present

## 2023-10-25 DIAGNOSIS — Z5901 Sheltered homelessness: Secondary | ICD-10-CM | POA: Diagnosis not present

## 2023-10-25 DIAGNOSIS — M25522 Pain in left elbow: Secondary | ICD-10-CM | POA: Diagnosis not present

## 2023-10-29 ENCOUNTER — Ambulatory Visit: Payer: Self-pay | Admitting: Licensed Clinical Social Worker
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
## 2023-11-02 NOTE — Patient Instructions (Signed)
 Visit Information  Thank you for taking time to visit with me today. Please don't hesitate to contact me if I can be of assistance to you before our next scheduled appointment.  Our next appointment is by telephone on 5/1 at 3 PM Please call the care guide team at [phone] if you need to cancel or reschedule your appointment.   Following is a copy of your care plan:   Goals Addressed             This Visit's Progress    LCSW VBCI Social Work Care Plan   On track    Problems:   Disease Management support and education needs related to Depression: depressed mood  CSW Clinical Goal(s):   Over the next 90 days the Patient will attend all scheduled medical appointments as evidenced by patient report and care team review of appointment completion in electronic MEDICAL RECORD NUMBER  demonstrate a reduction in symptoms related to Depression: depressed mood .  Interventions:  Mental Health:  Evaluation of current treatment plan related to Depression: depressed mood Active listening / Reflection utilized Behavioral Activation reviewed Emotional Support Provided Participation in counseling encourage : [facility] for Grief Support Participation in support group encouraged : Patient is not interested in group therapy Provided general psycho-education for mental health needs Quality of sleep assessed & Sleep Hygiene techniques promoted Solution-Focued Strategies employed:  Patient Goals/Self-Care Activities:  Continue taking your medication as prescribed.   Increase coping skills and healthy habits  Plan:   Telephone follow up appointment with care management team member scheduled for:  4 weeks     COMPLETED: Reduce depression/loneliness symptoms       Activities and task to complete in order to accomplish goals.   Call your insurance provider for more information about your Enhanced Benefits ( ) Expect phone call from [facility] to schedule your counseling appointment. Keep all  upcoming appointment discussed today Continue with compliance of taking medication prescribed by Doctor Self Support options  (seek volunteer opportunities, senior/rec center activities and other ways to be be more active/involved) Complete your HCPOA/advance directive and other documents I have placed a referral to the community resource care guide they will call you   I have place a referral with [facility] to assist with managing your mental health needs. They will contact you. Please contact them  to follow up if needed [phone].         Please call the Suicide and Crisis Lifeline: 988 go to [facility] Urgent [facility] [address] [place] [phone]) call 911 if you are experiencing a Mental Health or Behavioral Health Crisis or need someone to talk to.  The patient verbalized understanding of instructions, educational materials, and care plan provided today and DECLINED offer to receive copy of patient instructions, educational materials, and care plan.   Arlis Bent [facility]  [facility], [facility] Clinical Social Worker Direct Dial: [phone]  Fax: [phone] Website: [email or web address] 7:35 AM

## 2023-11-07 ENCOUNTER — Other Ambulatory Visit: Payer: Self-pay | Admitting: Internal Medicine

## 2023-11-07 DIAGNOSIS — I10 Essential (primary) hypertension: Secondary | ICD-10-CM

## 2023-11-09 NOTE — Telephone Encounter (Signed)
 Medication sent to pharmacy

## 2023-11-12 ENCOUNTER — Other Ambulatory Visit: Payer: Self-pay | Admitting: Licensed Clinical Social Worker

## 2023-11-16 NOTE — Patient Instructions (Signed)
 Visit Information  Thank you for taking time to visit with me today. Please don't hesitate to contact me if I can be of assistance to you before our next scheduled appointment.  Your next care management appointment is by telephone on 5/22 at 3 PM  Please call the care guide team at [phone] if you need to cancel, schedule, or reschedule an appointment.   Please call the Suicide and Crisis Lifeline: 988 call 911 if you are experiencing a Mental Health or Behavioral Health Crisis or need someone to talk to.  Alease Hunter, LCSW [place]  [facility], [facility] Clinical Social Worker Direct Dial: [phone]  Fax: [phone] Website: [email or web address] 12:13 PM
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
## 2023-12-02 ENCOUNTER — Other Ambulatory Visit: Payer: Self-pay | Admitting: Internal Medicine

## 2023-12-02 DIAGNOSIS — E559 Vitamin D deficiency, unspecified: Secondary | ICD-10-CM

## 2023-12-02 DIAGNOSIS — F322 Major depressive disorder, single episode, severe without psychotic features: Secondary | ICD-10-CM

## 2023-12-02 DIAGNOSIS — F332 Major depressive disorder, recurrent severe without psychotic features: Secondary | ICD-10-CM

## 2023-12-02 DIAGNOSIS — G8929 Other chronic pain: Secondary | ICD-10-CM

## 2023-12-02 NOTE — Telephone Encounter (Signed)
 Copied from CRM [id number]. Topic: Clinical - Medication Refill >> Dec 02, 2023  3:53 PM Carrielelia G wrote: Medication:   busPIRone  (BUSPAR ) 5 MG tablet gabapentin  (NEURONTIN ) 300 MG capsule sertraline  (ZOLOFT ) 100 MG tablet sertraline  (ZOLOFT ) 50 MG tablet Vitamin D , Cholecalciferol , 25 MCG (1000 UT) CAPS    Has the patient contacted their pharmacy? No (Agent: If no, request that the patient contact the pharmacy for the refill. If patient does not wish to contact the pharmacy document the reason why and proceed with request.) (Agent: If yes, when and what did the pharmacy advise?)  This is the patient's preferred pharmacy:  [id number] - [place], [place] - [address] AT [address] [facility] [address] [place] [address] Phone: [phone] Fax: [phone]  Is this the correct pharmacy for this prescription? Yes If no, delete pharmacy and type the correct one.   Has the prescription been filled recently? Yes  Is the patient out of the medication? Yes  Has the patient been seen for an appointment in the last year OR does the patient have an upcoming appointment? Yes  Can we respond through MyChart? No  Agent: Please be advised that Rx refills may take up to 3 business days. We ask that you follow-up with your pharmacy.

## 2023-12-03 ENCOUNTER — Other Ambulatory Visit: Payer: Self-pay | Admitting: Licensed Clinical Social Worker

## 2023-12-04 MED ORDER — VITAMIN D (CHOLECALCIFEROL) 25 MCG (1000 UT) PO CAPS
1.0000 | ORAL_CAPSULE | Freq: Every day | ORAL | 3 refills | Status: DC
Start: 2023-12-04 — End: 2024-01-26

## 2023-12-04 MED ORDER — GABAPENTIN 300 MG PO CAPS
ORAL_CAPSULE | ORAL | 1 refills | Status: DC
Start: 2023-12-04 — End: 2024-01-26

## 2023-12-04 MED ORDER — SERTRALINE HCL 100 MG PO TABS: 100.0000 mg | ORAL_TABLET | Freq: Every day | ORAL | 3 refills | Status: DC

## 2023-12-04 MED ORDER — BUSPIRONE HCL 5 MG PO TABS
5.0000 mg | ORAL_TABLET | Freq: Three times a day (TID) | ORAL | 1 refills | Status: DC | PRN
Start: 2023-12-04 — End: 2024-01-26

## 2023-12-04 MED ORDER — SERTRALINE HCL 50 MG PO TABS
50.0000 mg | ORAL_TABLET | Freq: Every day | ORAL | 2 refills | Status: DC
Start: 2023-12-04 — End: 2024-01-26

## 2023-12-04 NOTE — Patient Instructions (Signed)
 Visit Information  Thank you for taking time to visit with me today. Please don't hesitate to contact me if I can be of assistance to you before our next scheduled appointment.  Your next care management appointment is by telephone on 06/26 at 3 PM  Please call the care guide team at [phone] if you need to cancel, schedule, or reschedule an appointment.   Please call the Suicide and Crisis Lifeline: 988 go to [facility] Urgent [facility] [address] [place] [phone]) call 911 if you are experiencing a Mental Health or Behavioral Health Crisis or need someone to talk to.  Alease Hunter, LCSW [place]  [facility], [facility] Clinical Social Worker Direct Dial: [phone]  Fax: [phone] Website: [email or web address] 9:36 AM

## 2023-12-04 NOTE — Telephone Encounter (Signed)
 Patient overdue for one month f/u for depression. Please schedule next available.
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
## 2023-12-09 ENCOUNTER — Telehealth: Payer: Self-pay | Admitting: *Deleted

## 2023-12-09 NOTE — Telephone Encounter (Signed)
 Lvm for call back regarding scheduling mammogram.

## 2023-12-24 DIAGNOSIS — E785 Hyperlipidemia, unspecified: Secondary | ICD-10-CM | POA: Diagnosis not present

## 2023-12-24 DIAGNOSIS — N1831 Chronic kidney disease, stage 3a: Secondary | ICD-10-CM | POA: Diagnosis not present

## 2023-12-24 DIAGNOSIS — I129 Hypertensive chronic kidney disease with stage 1 through stage 4 chronic kidney disease, or unspecified chronic kidney disease: Secondary | ICD-10-CM | POA: Diagnosis not present

## 2023-12-24 DIAGNOSIS — R2681 Unsteadiness on feet: Secondary | ICD-10-CM | POA: Diagnosis not present

## 2023-12-24 DIAGNOSIS — Z6825 Body mass index (BMI) 25.0-25.9, adult: Secondary | ICD-10-CM | POA: Diagnosis not present

## 2023-12-24 DIAGNOSIS — E663 Overweight: Secondary | ICD-10-CM | POA: Diagnosis not present

## 2023-12-24 DIAGNOSIS — Z008 Encounter for other general examination: Secondary | ICD-10-CM | POA: Diagnosis not present

## 2024-01-07 ENCOUNTER — Telehealth: Payer: Self-pay | Admitting: Licensed Clinical Social Worker

## 2024-01-07 ENCOUNTER — Encounter: Payer: Self-pay | Admitting: Licensed Clinical Social Worker

## 2024-01-13 ENCOUNTER — Telehealth: Payer: Self-pay | Admitting: *Deleted

## 2024-01-13 NOTE — Progress Notes (Signed)
 Complex Care Management Care Guide Note  01/13/2024 Name: Sarah [name] MRN: [id number] DOB: [birth year]  Sarah [name] is a 78 y.o. year old female who is a primary care patient of Karna Fellows, MD and is actively engaged with the care management team. I reached out to Sarah [name] by phone today to assist with re-scheduling  with the Licensed Clinical Child psychotherapist.  Follow up plan: Unsuccessful telephone outreach attempt made. A HIPAA compliant phone message was left for the patient providing contact information and requesting a return call.  Harlene Satterfield  [facility]  Value[facility]Based Care Institute, [facility] Guide  Direct Dial: [phone]  Fax [phone]

## 2024-01-13 NOTE — Progress Notes (Signed)
 Complex Care Management Care Guide Note  01/13/2024 Name: [name] MRN: [id number] DOB: 10-14-1945  Sarah [name] is a 78 y.o. year old female who is a primary care patient of Karna Fellows, MD and is actively engaged with the care management team. I reached out to Sarah [name] by phone today to assist with re-scheduling  with the Licensed Clinical Child psychotherapist.  Follow up plan: Telephone appointment with complex care management team member scheduled for:  7/24  Harlene Satterfield  [facility]  Value-Based Care Institute, [facility] Guide  Direct Dial: [phone]  Fax [phone]

## 2024-01-26 ENCOUNTER — Other Ambulatory Visit: Payer: Self-pay

## 2024-01-26 ENCOUNTER — Encounter: Payer: Self-pay | Admitting: Internal Medicine

## 2024-01-26 ENCOUNTER — Ambulatory Visit: Admitting: Internal Medicine

## 2024-01-26 VITALS — BP 123/48 | HR 70 | Temp 98.2°F | Ht 64.0 in | Wt 212.4 lb

## 2024-01-26 DIAGNOSIS — F332 Major depressive disorder, recurrent severe without psychotic features: Secondary | ICD-10-CM | POA: Diagnosis not present

## 2024-01-26 DIAGNOSIS — G8929 Other chronic pain: Secondary | ICD-10-CM

## 2024-01-26 DIAGNOSIS — E559 Vitamin D deficiency, unspecified: Secondary | ICD-10-CM

## 2024-01-26 DIAGNOSIS — N1831 Chronic kidney disease, stage 3a: Secondary | ICD-10-CM | POA: Diagnosis not present

## 2024-01-26 DIAGNOSIS — E785 Hyperlipidemia, unspecified: Secondary | ICD-10-CM

## 2024-01-26 DIAGNOSIS — I129 Hypertensive chronic kidney disease with stage 1 through stage 4 chronic kidney disease, or unspecified chronic kidney disease: Secondary | ICD-10-CM

## 2024-01-26 DIAGNOSIS — F322 Major depressive disorder, single episode, severe without psychotic features: Secondary | ICD-10-CM

## 2024-01-26 DIAGNOSIS — N6311 Unspecified lump in the right breast, upper outer quadrant: Secondary | ICD-10-CM

## 2024-01-26 DIAGNOSIS — M67814 Other specified disorders of tendon, left shoulder: Secondary | ICD-10-CM

## 2024-01-26 DIAGNOSIS — I10 Essential (primary) hypertension: Secondary | ICD-10-CM

## 2024-01-26 MED ORDER — VITAMIN D (CHOLECALCIFEROL) 25 MCG (1000 UT) PO CAPS
1.0000 | ORAL_CAPSULE | Freq: Every day | ORAL | 3 refills | Status: DC
  Filled 2024-01-26: qty 90, fill #0

## 2024-01-26 MED ORDER — SERTRALINE HCL 150 MG PO CAPS
1.0000 | ORAL_CAPSULE | Freq: Every day | ORAL | 11 refills | Status: DC
  Filled 2024-01-26 – 2024-05-26 (×2): qty 30, 30d supply, fill #0

## 2024-01-26 MED ORDER — ATORVASTATIN CALCIUM 40 MG PO TABS
40.0000 mg | ORAL_TABLET | Freq: Every day | ORAL | 3 refills | Status: DC
  Filled 2024-01-26: qty 90, 90d supply, fill #0

## 2024-01-26 MED ORDER — SERTRALINE HCL 100 MG PO TABS
150.0000 mg | ORAL_TABLET | Freq: Every day | ORAL | 11 refills | Status: DC
  Filled 2024-01-26: qty 45, 30d supply, fill #0
  Filled 2024-05-26: qty 45, 30d supply, fill #1

## 2024-01-26 MED ORDER — BUPROPION HCL ER (XL) 150 MG PO TB24
150.0000 mg | ORAL_TABLET | ORAL | 3 refills | Status: DC
  Filled 2024-01-26: qty 90, 90d supply, fill #0
  Filled 2024-05-26: qty 90, 90d supply, fill #1
  Filled 2024-05-26: qty 30, 30d supply, fill #1

## 2024-01-26 MED ORDER — HYDROCHLOROTHIAZIDE 12.5 MG PO TABS
12.5000 mg | ORAL_TABLET | Freq: Every day | ORAL | 3 refills | Status: DC
  Filled 2024-01-26: qty 90, 90d supply, fill #0
  Filled 2024-05-26 (×2): qty 90, 90d supply, fill #1

## 2024-01-26 MED ORDER — BUPROPION HCL ER (XL) 150 MG PO TB24
150.0000 mg | ORAL_TABLET | ORAL | 3 refills | Status: DC
  Filled 2024-01-26: qty 90, 90d supply, fill #0

## 2024-01-26 MED ORDER — GABAPENTIN 300 MG PO CAPS
ORAL_CAPSULE | ORAL | 1 refills | Status: DC
Start: 2024-01-26 — End: 2024-03-30
  Filled 2024-01-26: qty 450, 90d supply, fill #0

## 2024-01-26 MED ORDER — BUSPIRONE HCL 5 MG PO TABS
5.0000 mg | ORAL_TABLET | Freq: Three times a day (TID) | ORAL | 1 refills | Status: DC | PRN
  Filled 2024-01-26: qty 90, 30d supply, fill #0

## 2024-01-26 MED ORDER — AMLODIPINE BESYLATE 2.5 MG PO TABS
2.5000 mg | ORAL_TABLET | Freq: Every day | ORAL | 3 refills | Status: DC
  Filled 2024-01-26: qty 90, 90d supply, fill #0
  Filled 2024-05-26 (×4): qty 90, 90d supply, fill #1

## 2024-01-26 MED ORDER — MELATONIN 3 MG PO TABS
ORAL_TABLET | ORAL | 11 refills | Status: AC
  Filled 2024-01-26: qty 30, 30d supply, fill #0

## 2024-01-26 NOTE — Progress Notes (Signed)
 Established Patient Office Visit  Subjective   Patient ID: Sarah [name], female    DOB: [birth year]  Age: 78 y.o. MRN: [id number]  Chief Complaint  Patient presents with   Follow-up    Leg/arm pain. Stress. Insomnia.    Sarah [name] returns to clinic today for f/u of chronic medical conditions. She wishes all medications to be transferred to [facility] which has been completed today. Please see assessment/plan in problem-based charting for further details of today's visit.   Patient Active Problem List   Diagnosis Date Noted   Stage 3a chronic kidney disease (HCC) 01/26/2024   Mass of upper outer quadrant of right breast 10/14/2023   History of prediabetes 07/28/2023   Obesity, morbid (HCC) 03/03/2023   Other skin changes 03/03/2023   PAC (premature atrial contraction) 02/10/2023   Chronic pain of both knees 11/25/2022   Tendinosis of left rotator cuff 09/15/2022   Cholelithiases 05/31/2020   Uterine fibroid 05/31/2020   OSA (obstructive sleep apnea) 06/15/2018   Severe recurrent major depression (HCC) 05/07/2016   Vitamin D  deficiency 09/18/2015   Essential hypertension, benign 10/08/2012   Health care maintenance 07/09/2012   Hyperlipidemia 06/16/2011      Objective:     BP (!) 123/48 (BP Location: Right Arm, Patient Position: Sitting, Cuff Size: Large)   Pulse 70   Temp 98.2 F (36.8 C) (Oral)   Ht 5' 4 (1.626 m)   Wt 212 lb 6.4 oz (96.3 kg)   SpO2 100% Comment: RA  BMI 36.46 kg/m  BP Readings from Last 3 Encounters:  01/26/24 (!) 123/48  10/14/23 (!) 133/58  08/18/23 122/60   Wt Readings from Last 3 Encounters:  01/26/24 212 lb 6.4 oz (96.3 kg)  10/14/23 213 lb 9.6 oz (96.9 kg)  10/07/23 225 lb (102.1 kg)      Physical Exam Constitutional:      Appearance: Normal appearance. She is not ill-appearing.  Musculoskeletal:     Comments: Reduced ROM of left shoulder, TTP. No deformity.   Neurological:     Mental Status: She is alert.  Mental status is at baseline.  Psychiatric:        Mood and Affect: Mood normal.        Behavior: Behavior normal.        Thought Content: Thought content normal.        Assessment & Plan:   Problem List Items Addressed This Visit       Cardiovascular and Mediastinum   Essential hypertension, benign (Chronic)   Blood pressure at goal. No changes.  Plan -Continue amlodipine  2.5 mg and hydrochlorothiazide  12.5 mg daily -BMP        Relevant Medications   atorvastatin  (LIPITOR) 40 MG tablet   hydrochlorothiazide  (HYDRODIURIL ) 12.5 MG tablet   amLODipine  (NORVASC ) 2.5 MG tablet     Musculoskeletal and Integument   Tendinosis of left rotator cuff   Chronic left shoulder pain, previously seen by orthopedics and sports medicine. Additional mechanical fall on to the left side since last visit. She reports ED visit at that time negative for fractures. Re-established with sports medicine in February 2025. Previou concern for possible full thickness rotator cuff tear. Unable to attend PT due to cost. She has been completing home exercises and strengthening provided with sports med without improvement in symptoms. Continued hesitation regarding steroid injections but I do think this would be reasonable and she is willing to f/u with sports medicine again. Phone number provided to schedule.  Plan -Return sports medicine for consideration of steroid injection -Avoid NSAIDs, continue Tylenol  and Voltaren  gel        Genitourinary   Stage 3a chronic kidney disease (HCC) - Primary   Creatinine/eGFR within the CKD3a range over the last year or so. Discussed with Sarah [name] today. Suspect d/t longstanding HTN, possibly NSAID use. Recheck today, add ACR.      Relevant Orders   Basic metabolic panel with GFR   Microalbumin / creatinine urine ratio     Other   Hyperlipidemia   Remains on atorvastatin  40 mg. LDL 119 at January visit.  Plan -Lipid profile -Continue atorvastatin  40 mg  daily      Relevant Medications   atorvastatin  (LIPITOR) 40 MG tablet   hydrochlorothiazide  (HYDRODIURIL ) 12.5 MG tablet   amLODipine  (NORVASC ) 2.5 MG tablet   Other Relevant Orders   Lipid Profile   Vitamin D  deficiency   Relevant Medications   Vitamin D , Cholecalciferol , 25 MCG (1000 UT) CAPS   Severe recurrent major depression (HCC)   Improvement in PHQ9 score (22 > 14). Following with [facility] SW team for referral to counseling. Her mood seems improved today from recent visits with full affect and evidence of positive thinking. She has been attending bingo and made several friends. She is now interested in going to [employer] classes at [employer]. I think these social events and physical activity will be tremendously helpful for her and encouraged her to attend. Refills of medications provided and sent to preferred pharmacy.        Relevant Medications   melatonin 3 MG TABS tablet   busPIRone  (BUSPAR ) 5 MG tablet   sertraline  (ZOLOFT ) 100 MG tablet   buPROPion  (WELLBUTRIN  XL) 150 MG 24 hr tablet   Mass of upper outer quadrant of right breast   Noted right breast mass at visit 10/2023. Diagnostic mammogram ordered but not scheduled. I have provided the phone number for Sarah [name] to call today.      Other Visit Diagnoses       Current severe episode of major depressive disorder without psychotic features without prior episode (HCC)       Relevant Medications   melatonin 3 MG TABS tablet   busPIRone  (BUSPAR ) 5 MG tablet   sertraline  (ZOLOFT ) 100 MG tablet   buPROPion  (WELLBUTRIN  XL) 150 MG 24 hr tablet     Other chronic pain       Relevant Medications   gabapentin  (NEURONTIN ) 300 MG capsule   sertraline  (ZOLOFT ) 100 MG tablet   buPROPion  (WELLBUTRIN  XL) 150 MG 24 hr tablet       Return in about 3 months (around 04/27/2024).    Ronnald Sergeant, MD

## 2024-01-26 NOTE — Assessment & Plan Note (Signed)
 Chronic left shoulder pain, previously seen by orthopedics and sports medicine. Additional mechanical fall on to the left side since last visit. She reports ED visit at that time negative for fractures. Re-established with sports medicine in February 2025. Previou concern for possible full thickness rotator cuff tear. Unable to attend PT due to cost. She has been completing home exercises and strengthening provided with sports med without improvement in symptoms. Continued hesitation regarding steroid injections but I do think this would be reasonable and she is willing to f/u with sports medicine again. Phone number provided to schedule.  Plan -Return sports medicine for consideration of steroid injection -Avoid NSAIDs, continue Tylenol  and Voltaren  gel

## 2024-01-26 NOTE — Assessment & Plan Note (Signed)
 Creatinine/eGFR within the CKD3a range over the last year or so. Discussed with Sarah [name] today. Suspect d/t longstanding HTN, possibly NSAID use. Recheck today, add ACR.

## 2024-01-26 NOTE — Assessment & Plan Note (Signed)
 Blood pressure at goal. No changes.  Plan -Continue amlodipine  2.5 mg and hydrochlorothiazide  12.5 mg daily -BMP

## 2024-01-26 NOTE — Patient Instructions (Addendum)
 It was wonderful to see you today!  Please call the Breast Center to schedule your mammogram:  [phone]  Please call sports medicine to schedule shoulder injection: [phone]  *For exercise classes (in-person and online), group activities, and more* (50+)            [email or web address]

## 2024-01-26 NOTE — Assessment & Plan Note (Signed)
 Remains on atorvastatin  40 mg. LDL 119 at January visit.  Plan -Lipid profile -Continue atorvastatin  40 mg daily

## 2024-01-26 NOTE — Assessment & Plan Note (Signed)
 Improvement in PHQ9 score (22 > 14). Following with [facility] SW team for referral to counseling. Her mood seems improved today from recent visits with full affect and evidence of positive thinking. She has been attending bingo and made several friends. She is now interested in going to [employer] classes at [employer]. I think these social events and physical activity will be tremendously helpful for her and encouraged her to attend. Refills of medications provided and sent to preferred pharmacy.

## 2024-01-26 NOTE — Assessment & Plan Note (Signed)
 Noted right breast mass at visit 10/2023. Diagnostic mammogram ordered but not scheduled. I have provided the phone number for Ms. Hallums to call today.

## 2024-01-28 LAB — BASIC METABOLIC PANEL WITH GFR
BUN/Creatinine Ratio: 10 — ABNORMAL LOW (ref 12–28)
BUN: 11 mg/dL (ref 8–27)
CO2: 19 mmol/L — ABNORMAL LOW (ref 20–29)
Calcium: 9.8 mg/dL (ref 8.7–10.3)
Chloride: 103 mmol/L (ref 96–106)
Creatinine, Ser: 1.14 mg/dL — ABNORMAL HIGH (ref 0.57–1.00)
Glucose: 85 mg/dL (ref 70–99)
Potassium: 4.1 mmol/L (ref 3.5–5.2)
Sodium: 143 mmol/L (ref 134–144)
eGFR: 50 mL/min/1.73 — ABNORMAL LOW (ref >59)

## 2024-01-28 LAB — LIPID PANEL
Chol/HDL Ratio: 6.6 ratio — ABNORMAL HIGH (ref 0.0–4.4)
Cholesterol, Total: 308 mg/dL — ABNORMAL HIGH (ref 100–199)
HDL: 47 mg/dL (ref >39)
LDL Chol Calc (NIH): 235 mg/dL — ABNORMAL HIGH (ref 0–99)
Triglycerides: 138 mg/dL (ref 0–149)
VLDL Cholesterol Cal: 26 mg/dL (ref 5–40)

## 2024-01-28 LAB — MICROALBUMIN / CREATININE URINE RATIO
Creatinine, Urine: 161.1 mg/dL
Microalb/Creat Ratio: 9 mg/g{creat} (ref 0–29)
Microalbumin, Urine: 15.1 ug/mL

## 2024-01-29 ENCOUNTER — Ambulatory Visit: Payer: Self-pay | Admitting: Internal Medicine

## 2024-01-29 ENCOUNTER — Other Ambulatory Visit: Payer: Self-pay

## 2024-01-29 NOTE — Progress Notes (Signed)
 Kidney function remains consistent with CKD3a as discussed at last visit. ACR negative for microalbuminuria. Unable to reach Sarah [name] for review, will continue to attempt to reach her.

## 2024-01-29 NOTE — Progress Notes (Signed)
 Significnat increase in total and LDL cholesterol from 6 months ago. Patient reported adherence to statin. Unable to review with Sarah [name] by telephone, will continue to attempt to reach her.

## 2024-02-01 ENCOUNTER — Other Ambulatory Visit (HOSPITAL_COMMUNITY): Payer: Self-pay

## 2024-02-02 ENCOUNTER — Telehealth: Payer: Self-pay | Admitting: *Deleted

## 2024-02-02 NOTE — Telephone Encounter (Signed)
 Called the breast center to get appointment / patient will need to get film and disc of mammogram from [facility] office and sign a record release and take film and disc to the breast center before getting appointment/  called patient back LVM with this information.

## 2024-02-04 ENCOUNTER — Other Ambulatory Visit: Payer: Self-pay

## 2024-02-04 ENCOUNTER — Telehealth: Payer: Self-pay | Admitting: Internal Medicine

## 2024-02-04 ENCOUNTER — Other Ambulatory Visit: Payer: Self-pay | Admitting: Licensed Clinical Social Worker

## 2024-02-04 DIAGNOSIS — Z1211 Encounter for screening for malignant neoplasm of colon: Secondary | ICD-10-CM

## 2024-02-04 DIAGNOSIS — E785 Hyperlipidemia, unspecified: Secondary | ICD-10-CM

## 2024-02-04 MED ORDER — ATORVASTATIN CALCIUM 80 MG PO TABS
80.0000 mg | ORAL_TABLET | Freq: Every day | ORAL | 3 refills | Status: DC
  Filled 2024-02-04: qty 90, 90d supply, fill #0
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
## 2024-02-04 NOTE — Patient Instructions (Signed)
 Visit Information  Thank you for taking time to visit with me today. Please don't hesitate to contact me if I can be of assistance to you before our next scheduled appointment.  Your next care management appointment is by telephone on 08/22 at 12:30 PM  Please call the care guide team at [phone] if you need to cancel, schedule, or reschedule an appointment.   Please call the Suicide and Crisis Lifeline: 988 go to [facility] Urgent [facility] [address], [place] [phone]) call 911 if you are experiencing a Mental Health or Behavioral Health Crisis or need someone to talk to.  Rolin Kerns, LCSW [place]  [facility], [facility] Clinical Social Worker Direct Dial: [phone]  Fax: [phone] Website: [email or web address] 1:23 PM

## 2024-02-04 NOTE — Telephone Encounter (Signed)
 Labs reviewed with Sarah [name]. She has been adherent to atorvastatin  40 mg except for 1 week in the past month or so. We discussed significant increase in lipid levels and need for improvement. Increase atorvastatin  to 80 mg daily, repeat at next visit. Discussed healthy diet and exercise. We reviewed the need to schedule her mammogram and she would like to be referred for colonoscopy.

## 2024-02-12 ENCOUNTER — Other Ambulatory Visit: Payer: Self-pay

## 2024-02-15 ENCOUNTER — Other Ambulatory Visit: Payer: Self-pay

## 2024-03-02 ENCOUNTER — Ambulatory Visit: Payer: Self-pay

## 2024-03-02 NOTE — Telephone Encounter (Signed)
 FYI Only or Action Required?: FYI only for provider.  Patient was last seen in primary care on 01/26/2024 by Karna Fellows, MD.  Called Nurse Triage reporting Pain.  Symptoms began about a month ago.  Interventions attempted: Nothing.  Symptoms are: gradually worsening.  Triage Disposition: See PCP When Office is Open (Within 3 Days)  Patient/caregiver understands and will follow disposition?: Yes   Copied from CRM #[id number]. Topic: Clinical - Red Word Triage >> Mar 02, 2024  2:47 PM Mercer PEDLAR wrote: Red Word that prompted transfer to Nurse Triage: Leg pain when walking, feeling like her legs are going to give out, loss of appetite, and mentioned that food is going through her but did not specify if she is having vomiting or diarrhea. Reason for Disposition  Nausea lasts > 1 week  [1] MODERATE pain (e.g., interferes with normal activities, limping) AND [2] present > 3 days  Answer Assessment - Initial Assessment Questions 1. ONSET: When did the pain start?      X 1 month 2. LOCATION: Where is the pain located?      Bilateral knee 3. PAIN: How bad is the pain?    (Scale 1-10; or mild, moderate, severe)     Sharp pain while walking 4. WORK OR EXERCISE: Has there been any recent work or exercise that involved this part of the body?      no 5. CAUSE: What do you think is causing the leg pain?     unknown 6. OTHER SYMPTOMS: Do you have any other symptoms? (e.g., chest pain, back pain, breathing difficulty, swelling, rash, fever, numbness, weakness)     Pain / discomfort causes pt to be exhausted very easilty 7. PREGNANCY: Is there any chance you are pregnant? When was your last menstrual period?     na  Left leg - previous surgery has rods and pain and pain is only when walking  Answer Assessment - Initial Assessment Questions 1. NAUSEA SEVERITY: How bad is the nausea? (e.g., mild, moderate, severe; dehydration, weight loss)     moderate 2. ONSET: When did the  nausea begin?     X 2 or 3 months 3. VOMITING: Any vomiting? If Yes, ask: How many times today?     At times 4. RECURRENT SYMPTOM: Have you had nausea before? If Yes, ask: When was the last time? What happened that time?     no 5. CAUSE: What do you think is causing the nausea?     unknown 6. PREGNANCY: Is there any chance you are pregnant? (e.g., unprotected intercourse, missed birth control pill, broken condom)     Na   When pt eats states food goes right through her: diarrhea  Protocols used: Leg Pain-A-AH, Nausea-A-AH

## 2024-03-02 NOTE — Telephone Encounter (Signed)
 Called pt to f/u on her symptoms - no answer; left message of office's return call. Pt has an appt tomorrow 8/21 with Dr Celestina at 0915 AM.

## 2024-03-03 ENCOUNTER — Ambulatory Visit: Payer: Self-pay | Admitting: Student

## 2024-03-03 VITALS — BP 125/67 | HR 69 | Temp 98.0°F | Ht 64.0 in | Wt 206.2 lb

## 2024-03-03 DIAGNOSIS — M25562 Pain in left knee: Secondary | ICD-10-CM | POA: Diagnosis not present

## 2024-03-03 DIAGNOSIS — R195 Other fecal abnormalities: Secondary | ICD-10-CM | POA: Diagnosis not present

## 2024-03-03 DIAGNOSIS — M25561 Pain in right knee: Secondary | ICD-10-CM | POA: Diagnosis not present

## 2024-03-03 DIAGNOSIS — R197 Diarrhea, unspecified: Secondary | ICD-10-CM

## 2024-03-03 DIAGNOSIS — G8929 Other chronic pain: Secondary | ICD-10-CM | POA: Diagnosis not present

## 2024-03-03 NOTE — Assessment & Plan Note (Deleted)
 Chronic bilateral knee pain, L > R. Unfortunately, Sarah [name] has had a mechanical fall causing worsening of her left knee pain. Low suspicion for fracture. Mild antalgic gait, otherwise normal without assistance. We have again discussed physical therapy and referral was placed. She did not receive an injection at last sports medicine visit but I do feel she could benefit from this.  Plan -Referral to sports medicine, consider steroid injection -Referral to PT -Avoid NSAIDs, Tylenol  and Voltaren  gel okay -Referral for exercise program
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
## 2024-03-04 ENCOUNTER — Other Ambulatory Visit: Payer: Self-pay | Admitting: Licensed Clinical Social Worker

## 2024-03-06 DIAGNOSIS — R195 Other fecal abnormalities: Secondary | ICD-10-CM | POA: Insufficient documentation

## 2024-03-06 NOTE — Assessment & Plan Note (Signed)
 She has not followed up with sports medicine due to cost concerns. Previously, her extremity pain improved with exercise. Over the past several weeks to months, she has remained mostly indoors and has not exercised, stating this is due to feeling "lazy." She denies depressed mood or changes in sleep. - Encouraged resuming exercise.

## 2024-03-06 NOTE — Assessment & Plan Note (Signed)
 Patient presents with postprandial loose stools but does not meet criteria for diarrhea, as she reports fewer than three bowel movements per day. Lack of recent antibiotic exposure, stool frequency, and consistency make C. difficile unlikely. Symptoms are also unlikely related to gabapentin , which she has been taking chronically without prior gastrointestinal side effects. She denies nausea, vomiting, abdominal pain, or other GI symptoms. No recent travel or sick contacts, making a viral etiology less likely. - Reassurance provided. - Advised to monitor symptoms, instructed to call the clinic if she develops worsening watery stools, with greater than 3 bowel movements per day.

## 2024-03-07 NOTE — Progress Notes (Signed)
 Internal Medicine Clinic Attending  Case discussed with the resident at the time of the visit.  We reviewed the resident's history and exam and pertinent patient test results.  I agree with the assessment, diagnosis, and plan of care documented in the resident's note. Advised to return to clinic if abnormal stools continue following the next week. Referred to GI for CSY at recent visit.

## 2024-03-08 ENCOUNTER — Encounter: Payer: Self-pay | Admitting: *Deleted
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
## 2024-03-11 NOTE — Patient Instructions (Signed)
 Visit Information  Thank you for taking time to visit with me today. Please don't hesitate to contact me if I can be of assistance to you before our next scheduled appointment.  Your next care management appointment is by telephone on 09/26 at 2 PM  Please call the care guide team at [phone] if you need to cancel, schedule, or reschedule an appointment.   Please call the Suicide and Crisis Lifeline: 988 go to [facility] Urgent [facility] [address] [place] [phone]) call 911 if you are experiencing a Mental Health or Behavioral Health Crisis or need someone to talk to.  Rolin Kerns, LCSW [place]  [facility], [facility] Clinical Social Worker Direct Dial: [phone]  Fax: [phone] Website: [email or web address] 5:06 AM

## 2024-03-23 ENCOUNTER — Telehealth: Payer: Self-pay | Admitting: *Deleted

## 2024-03-23 NOTE — Telephone Encounter (Signed)
 Patient walked in stating that she was having pain in her upper legs.  Has been having for a few months.  Was given an appointment for Friday morning 03/25/2024 8:45 AM.

## 2024-03-24 ENCOUNTER — Ambulatory Visit: Payer: Self-pay

## 2024-03-24 NOTE — Telephone Encounter (Signed)
 FYI Only or Action Required?: FYI only for provider.  Patient was last seen in primary care on 03/03/2024 by Celestina Czar, MD.  Called Nurse Triage reporting Fatigue.  Symptoms began several months ago.  Interventions attempted: Rest, hydration, or home remedies.  Symptoms are: gradually worsening.  Triage Disposition: See PCP When Office is Open (Within 3 Days)  Patient/caregiver understands and will follow disposition?: Yes    Copied from CRM [id number]. Topic: Clinical - Red Word Triage >> Mar 24, 2024  4:35 PM Brittney F wrote: Red Word that prompted transfer to Nurse Triage:   Low energy Reason for Disposition  [1] Fatigue (i.e., tires easily, decreased energy) AND [2] persists > 1 week  Answer Assessment - Initial Assessment Questions Patient states she needs vitamins for symptoms to increase her energy. Patient states she would like a B12 shot. Patient states she lives alone and feels wore out when walking to kitchen or mailbox.   1. DESCRIPTION: Describe how you are feeling.     Patient states she just needs some energy to clean her house.  2. SEVERITY: How bad is it?  Can you stand and walk?     Patient states she can walk and exercise 3. ONSET: When did these symptoms begin? (e.g., hours, days, weeks, months)     6 months  4. NEW MEDICINES:  Have you started on any new medicines recently? (e.g., opioid pain medicines, benzodiazepines, muscle relaxants, antidepressants, antihistamines, neuroleptics, beta blockers)     Patient states she had a recent increase in her Lipitor  5. OTHER SYMPTOMS: Do you have any other symptoms? (e.g., chest pain, fever, cough, SOB, vomiting, diarrhea, bleeding, other areas of pain)     Pain in legs and thighs when walking, muscle tightness  Protocols used: Weakness (Generalized) and Fatigue-A-AH

## 2024-03-25 ENCOUNTER — Ambulatory Visit: Admitting: Student

## 2024-03-25 NOTE — Telephone Encounter (Signed)
 Pt's appt is Monday 9/15 with Dr Kristy Ahr.

## 2024-03-28 ENCOUNTER — Ambulatory Visit: Payer: Self-pay | Admitting: Student

## 2024-03-30 ENCOUNTER — Ambulatory Visit (INDEPENDENT_AMBULATORY_CARE_PROVIDER_SITE_OTHER): Payer: Self-pay

## 2024-03-30 VITALS — BP 147/77 | HR 76 | Temp 98.3°F | Ht 64.0 in | Wt 203.4 lb

## 2024-03-30 DIAGNOSIS — Z79899 Other long term (current) drug therapy: Secondary | ICD-10-CM

## 2024-03-30 DIAGNOSIS — G8929 Other chronic pain: Secondary | ICD-10-CM

## 2024-03-30 DIAGNOSIS — E785 Hyperlipidemia, unspecified: Secondary | ICD-10-CM

## 2024-03-30 DIAGNOSIS — Z8249 Family history of ischemic heart disease and other diseases of the circulatory system: Secondary | ICD-10-CM | POA: Diagnosis not present

## 2024-03-30 DIAGNOSIS — E559 Vitamin D deficiency, unspecified: Secondary | ICD-10-CM

## 2024-03-30 DIAGNOSIS — Z23 Encounter for immunization: Secondary | ICD-10-CM | POA: Diagnosis not present

## 2024-03-30 DIAGNOSIS — R5382 Chronic fatigue, unspecified: Secondary | ICD-10-CM

## 2024-03-30 DIAGNOSIS — I10 Essential (primary) hypertension: Secondary | ICD-10-CM | POA: Diagnosis not present

## 2024-03-30 MED ORDER — GABAPENTIN 300 MG PO CAPS: 600.0000 mg | ORAL_CAPSULE | Freq: Every day | ORAL | 0 refills | Status: DC

## 2024-03-30 MED ORDER — BLOOD PRESSURE CUFF MISC
1.0000 | Freq: Every day | 0 refills | Status: AC
Start: 2024-03-30 — End: ?

## 2024-03-30 MED ORDER — ATORVASTATIN CALCIUM 80 MG PO TABS: 80.0000 mg | ORAL_TABLET | Freq: Every day | ORAL | 3 refills | Status: DC

## 2024-03-30 MED ORDER — SERTRALINE HCL 100 MG PO TABS: 150.0000 mg | ORAL_TABLET | Freq: Every day | ORAL | 11 refills | Status: DC

## 2024-03-30 MED ORDER — VITAMIN D (CHOLECALCIFEROL) 25 MCG (1000 UT) PO CAPS: 1.0000 | ORAL_CAPSULE | Freq: Every day | ORAL | 3 refills | Status: AC

## 2024-03-30 NOTE — Assessment & Plan Note (Addendum)
 7/15 lab results: cholesterol 308, LDL 235. Increased atorvastatin  from 40 mg to 80 mg in July. States she eats a lot of candy and likes to drink sugary drinks. Cautioned her that sugary foods and drinks also impact cholesterol. Encouraged patient to eat a healthy diet as this will also improve her energy level. Plan to refill today.  Orders:   atorvastatin  (LIPITOR) 80 MG tablet; Take 1 tablet (80 mg total) by mouth daily.

## 2024-03-30 NOTE — Assessment & Plan Note (Signed)
 149/78, recheck 147/77. Patient says this is because she's been rushing around today. She does not check her Bps at home, I encouraged her too. Currently on amlodipine  2.5 mg and hydrochlorothiazide  12.5 mg, both daily. At last two office visits, 7/15 and 8/21, her BP has been at goal. Plan to continue current regimen and have her start checking them at home.  Orders:   Blood Pressure Monitoring (BLOOD PRESSURE CUFF) MISC; 1 Act by Does not apply route daily.

## 2024-03-30 NOTE — Patient Instructions (Addendum)
 It was wonderful seeing you today!   Things we talked about.SABRASABRA  1) call [phone] to schedule your colonoscopy   2) try stopping the morning and afternoon doses of gabapentin . I recommend only taking the gabapentin  600 mg (two tablets) at night. I hope this will reduce your day-time fatigue.   3) Continue exercising and eating healthy foods. Avoid eating too many sugary drinks and sweets.   Thank you for getting your flu shot. Keep up the great work!  If you have any questions please feel free to the call the clinic at anytime at [phone].  Have a blessed day,  Dr. Charmayne

## 2024-03-30 NOTE — Assessment & Plan Note (Addendum)
 Was low at 16 a year ago, normal at 34 8 months ago. Patient asked for refill today. Also encouraged her to eat a wide variety of foods such as fruits, vegetables, and lean animal products.  Orders:   Vitamin D , Cholecalciferol , 25 MCG (1000 UT) CAPS; Take 1 capsule by mouth daily.

## 2024-03-30 NOTE — Progress Notes (Signed)
 Established Patient Office Visit  Subjective   Patient ID: Sarah [name], female    DOB: [birth year]  Age: 78 y.o. MRN: [id number]  Patient seen with Dr. Shawn   Patient here to discuss her fatigue. Her husband got sick in 2021 and her life changed a lot. She was very active and working before he got sick but she eventually had to take care of him full time. He passed in 2021. She recently went to an exercise class at the gym and says that before she went she was having muscle cramps in her thighs but after the workout class the cramps were gone. She is looking forward to going to more gym classes and excited about the comunity aspect that the gym offers. She feels like her fatigue is because she doesn't do anything during the day or night, she often sleeps during the day and at night. She doesn't have issues with sleep that she knows of, she just feels like she has no energy to get up and clean the house or go anywhere. She wonders if she should be on vitamins or if any of her medicines are causing her fatigue. She hasn't had any recent medication changes or any other recent stressors besides her husband dying.          Objective:     BP (!) 147/77 (BP Location: Left Arm, Patient Position: Sitting, Cuff Size: Normal) Comment: patient states she has not taken medications  Pulse 76   Temp 98.3 F (36.8 C) (Oral)   Ht 5' 4 (1.626 m)   Wt 203 lb 6.4 oz (92.3 kg)   SpO2 94%   BMI 34.91 kg/m  BP Readings from Last 3 Encounters:  03/30/24 (!) 147/77  03/03/24 125/67  01/26/24 (!) 123/48   Wt Readings from Last 3 Encounters:  03/30/24 203 lb 6.4 oz (92.3 kg)  03/03/24 206 lb 3.2 oz (93.5 kg)  01/26/24 212 lb 6.4 oz (96.3 kg)      Physical Exam Vitals reviewed.  Constitutional:      Appearance: Normal appearance.  HENT:     Nose: Nose normal.  Cardiovascular:     Rate and Rhythm: Normal rate and regular rhythm.     Pulses: Normal pulses.     Heart sounds: Normal heart  sounds.  Pulmonary:     Effort: Pulmonary effort is normal.     Breath sounds: Normal breath sounds.  Musculoskeletal:     Right lower leg: No edema.     Left lower leg: No edema.  Skin:    General: Skin is warm.  Neurological:     General: No focal deficit present.     Mental Status: She is alert and oriented to person, place, and time.  Psychiatric:        Mood and Affect: Mood normal.        Behavior: Behavior normal.      No results found for any visits on 03/30/24.  Last lipids Lab Results  Component Value Date   CHOL 308 (H) 01/26/2024   HDL 47 01/26/2024   LDLCALC 235 (H) 01/26/2024   TRIG 138 01/26/2024   CHOLHDL 6.6 (H) 01/26/2024      The 10-year ASCVD risk score (Arnett DK, et al., 2019) is: 26.4%    Assessment & Plan:   Assessment & Plan Chronic fatigue TSH 05/2022 3.68, B12 5 years ago 268. Patient thinks she been on gabapentin  since her leg surgeries in 2014 and 2018. Currently  taking 600 mg morning, 300 mg midday, and 600 mg at bedtime. She isn't sure what would happen if she stopped taking them. She expressed concerns over the side effects. Discussed that gabapentin  can make you feel drowsy. Patient thinks her fatigue is likely due to her sedentary lifestyle. She felt a lot better just after going to the gym one time earlier this week. I encouraged her to keep going to the gym as I also think getting out of the house and increasing physical activity will greatly improve energy levels. BMI 34.91, I wonder if sleep apnea may be playing a role in her day time fatigue. Plan to decrease gabapentin  to only 600 mg at bedtime. Will have her return in 4 weeks if fatigue has not improved after regular physical activity and gabapentin  reduction. At this time, consider assessing symptoms of sleep apnea and sleep study. Consider updating TSH and B12 labs. Orders:   gabapentin  (NEURONTIN ) 300 MG capsule; Take 2 capsules (600 mg total) by mouth at bedtime.  Other chronic  pain Patient currently not having leg pain. She had been on 1500 mg a day before today. If she starts having pain again after decreasing gabapentin  dose, can increase nightly dose or add back a smaller daytime dose. Orders:   gabapentin  (NEURONTIN ) 300 MG capsule; Take 2 capsules (600 mg total) by mouth at bedtime.  Hyperlipidemia, unspecified hyperlipidemia type 7/15 lab results: cholesterol 308, LDL 235. Increased atorvastatin  from 40 mg to 80 mg in July. States she eats a lot of candy and likes to drink sugary drinks. Cautioned her that sugary foods and drinks also impact cholesterol. Encouraged patient to eat a healthy diet as this will also improve her energy level. Plan to refill today.  Orders:   atorvastatin  (LIPITOR) 80 MG tablet; Take 1 tablet (80 mg total) by mouth daily.  Vitamin D  deficiency Was low at 16 a year ago, normal at 34 8 months ago. Patient asked for refill today. Also encouraged her to eat a wide variety of foods such as fruits, vegetables, and lean animal products.  Orders:   Vitamin D , Cholecalciferol , 25 MCG (1000 UT) CAPS; Take 1 capsule by mouth daily.  Encounter for immunization Given today Orders:   Flu vaccine HIGH DOSE PF(Fluzone Trivalent)  Essential hypertension, benign 149/78, recheck 147/77. Patient says this is because she's been rushing around today. She does not check her Bps at home, I encouraged her too. Currently on amlodipine  2.5 mg and hydrochlorothiazide  12.5 mg, both daily. At last two office visits, 7/15 and 8/21, her BP has been at goal. Plan to continue current regimen and have her start checking them at home.  Orders:   Blood Pressure Monitoring (BLOOD PRESSURE CUFF) MISC; 1 Act by Does not apply route daily.   Return in about 4 weeks (around 04/27/2024) for fu.    Viktoria King, DO

## 2024-03-31 NOTE — Addendum Note (Signed)
 Addended by: Satonya Lux on: 03/31/2024 11:10 AM   Modules accepted: Level of Service

## 2024-03-31 NOTE — Progress Notes (Signed)
 Internal Medicine Clinic Attending  I was physically present during the key portions of the resident provided service and participated in the medical decision making of patient's management care. I reviewed pertinent patient test results.  The assessment, diagnosis, and plan were formulated together and I agree with the documentation in the resident's note.  Shawn Sick, MD

## 2024-04-01 ENCOUNTER — Other Ambulatory Visit: Payer: Self-pay

## 2024-04-06 ENCOUNTER — Telehealth: Payer: Self-pay | Admitting: *Deleted

## 2024-04-06 NOTE — Telephone Encounter (Unsigned)
 RTC to patient.  Message was left tht she does not need to start taking the Vit B 12 and that Dr. Karna plans to have labs drawn  to see if she needs to take he Vit B 12. Copied from CRM #[id number]. Topic: Clinical - Medication Question >> Apr 06, 2024  3:24 PM Suzette B wrote: Reason for CRM: Patient is needing to know if she can take the vitamin B12 for energy she just purchased some and is unsure if that would work for her or not. Please call patient to advise. [phone] (M)

## 2024-04-06 NOTE — Telephone Encounter (Signed)
 We can plan to check B12 level at upcoming visit on 10/7 and discuss whether she needs to start taking supplementation at that time.

## 2024-04-08 ENCOUNTER — Ambulatory Visit: Payer: Self-pay

## 2024-04-08 ENCOUNTER — Other Ambulatory Visit: Payer: Self-pay | Admitting: Licensed Clinical Social Worker

## 2024-04-08 NOTE — Telephone Encounter (Signed)
 FYI Only or Action Required?: Action required by provider: request for appointment.  Patient was last seen in primary care on 03/30/2024 by Charmayne Holmes, DO.  Called Nurse Triage reporting Diarrhea.  Symptoms began several months ago.  Interventions attempted: Nothing.  Symptoms are: unchanged.  Triage Disposition: See PCP Within 2 Weeks  Patient/caregiver understands and will follow disposition?: Yes    Copied from CRM [id number]. Topic: Clinical - Red Word Triage >> Apr 08, 2024 11:17 AM Alfonso ORN wrote: Red Word that prompted transfer to Nurse Triage: patient stated been going on for awhile ,no energy , legs get real tired can't walk from kitchen to front get tired, when eat get diarrhea that is watery . Reason for Disposition  Diarrhea is a chronic symptom (recurrent or ongoing AND present > 4 weeks)  Answer Assessment - Initial Assessment Questions Patient states she is losing weight and has a lack of appetite. Patient scheduled with PCP for an appointment for symptoms. Patient requesting if she can be seen sooner. Patient also given information regarding her discharge instructions from her last visit. This RN read verbatim  call [phone] to schedule your colonoscopy ,  try stopping the morning and afternoon doses of gabapentin . I recommend only taking the gabapentin  600 mg (two tablets) at night. Patient verbalized understanding and requesting an earlier appointment in office with her PCP if possible.     1. DIARRHEA SEVERITY: How bad is the diarrhea? How many more stools have you had in the past 24 hours than normal?      2 episodes  2. ONSET: When did the diarrhea begin?      1-2 months ago  3. STOOL DESCRIPTION:  How loose or watery is the diarrhea? What is the stool color? Is there any blood or mucous in the stool?     Regular brown color  4. VOMITING: Are you also vomiting? If Yes, ask: How many times in the past 24 hours?      Denies  5. ABDOMEN  PAIN: Are you having any abdomen pain? If Yes, ask: What does it feel like? (e.g., crampy, dull, intermittent, constant)      Denies  6. ORAL INTAKE: If vomiting, Have you been able to drink liquids? How much liquids have you had in the past 24 hours?     Patient sates she can still drink but has a lack of taste for things  7. HYDRATION: Any signs of dehydration? (e.g., dry mouth [not just dry lips], too weak to stand, dizziness, new weight loss) When did you last urinate?     Weight loss, fatigue when walking to kitchen. Able to urinate today  8. OTHER SYMPTOMS: Do you have any other symptoms? (e.g., fever, blood in stool)       Denies  Protocols used: Diarrhea-A-AH

## 2024-04-11 ENCOUNTER — Encounter: Payer: Self-pay | Admitting: Gastroenterology

## 2024-04-11 ENCOUNTER — Telehealth: Payer: Self-pay | Admitting: *Deleted

## 2024-04-11 NOTE — Telephone Encounter (Signed)
 Return pt's call - call went straight to vm. Left message of office's return call and to call the office.

## 2024-04-11 NOTE — Telephone Encounter (Signed)
 Copied from CRM [id number]. Topic: Clinical - Prescription Issue >> Apr 11, 2024  4:06 PM Susanna ORN wrote: Reason for CRM: Patient called in requesting to speak with provider or nurse. States she's being told to get the Vitamin D , Cholecalciferol , 25 MCG (1000 UT) CAPS over the counter but patient states that she doesn't get her medications OTC, but she gets them from the pharmacy. Patient states she's not about to keep going to buy all of these vitamins. Please give her a call back. CB #: [phone]

## 2024-04-14 NOTE — Telephone Encounter (Signed)
 Call pt again, went straight to vm. Left message there's a rx for Vit D ; and to call back if she needs to speak to the nurse.

## 2024-04-15 NOTE — Patient Instructions (Signed)
 Visit Information  Thank you for taking time to visit with me today. Please don't hesitate to contact me if I can be of assistance to you before our next scheduled appointment.  Your next care management appointment is by telephone on 10/30 at 2 PM  Please call the care guide team at [phone] if you need to cancel, schedule, or reschedule an appointment.   Please call the Suicide and Crisis Lifeline: 988 go to [facility] Urgent [facility] [address] [place] [phone]) call 911 if you are experiencing a Mental Health or Behavioral Health Crisis or need someone to talk to.  Rolin Kerns, LCSW   [facility], [facility] Clinical Social Worker Direct Dial: [phone]  Fax: [phone] Website: [email or web address] 5:36 PM
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
## 2024-04-19 ENCOUNTER — Encounter: Payer: Self-pay | Admitting: Internal Medicine

## 2024-04-19 ENCOUNTER — Ambulatory Visit (INDEPENDENT_AMBULATORY_CARE_PROVIDER_SITE_OTHER): Admitting: Internal Medicine

## 2024-04-19 VITALS — BP 141/67 | HR 70 | Temp 98.0°F | Ht 64.0 in | Wt 191.6 lb

## 2024-04-19 DIAGNOSIS — E785 Hyperlipidemia, unspecified: Secondary | ICD-10-CM

## 2024-04-19 DIAGNOSIS — G4733 Obstructive sleep apnea (adult) (pediatric): Secondary | ICD-10-CM | POA: Diagnosis not present

## 2024-04-19 DIAGNOSIS — M791 Myalgia, unspecified site: Secondary | ICD-10-CM

## 2024-04-19 DIAGNOSIS — R5383 Other fatigue: Secondary | ICD-10-CM | POA: Diagnosis not present

## 2024-04-19 DIAGNOSIS — Z79899 Other long term (current) drug therapy: Secondary | ICD-10-CM

## 2024-04-19 DIAGNOSIS — N6311 Unspecified lump in the right breast, upper outer quadrant: Secondary | ICD-10-CM | POA: Diagnosis not present

## 2024-04-19 DIAGNOSIS — I10 Essential (primary) hypertension: Secondary | ICD-10-CM

## 2024-04-19 NOTE — Progress Notes (Signed)
 Established Patient Office Visit  Subjective   Patient ID: Sarah [name], female    DOB: Aug 16, 1945  Age: 78 y.o. MRN: [id number]  Chief Complaint  Patient presents with   Follow-up    [facility] when walking along with legs pain.Fatigue.    Sarah [name] returns to clinic today for follow-up of chronic conditions, as well as to discuss several months of fatigue. Last seen by me 7/15 for chronic f/u. In the interim was seen for loose stools, which have since resolved, and fatigue on 9/17.Please see assessment/plan in problem-based charting for further details of today's visit    Patient Active Problem List   Diagnosis Date Noted   Loose stools 03/06/2024   Stage 3a chronic kidney disease (HCC) 01/26/2024   Mass of upper outer quadrant of right breast 10/14/2023   History of prediabetes 07/28/2023   Obesity, morbid (HCC) 03/03/2023   Other skin changes 03/03/2023   PAC (premature atrial contraction) 02/10/2023   Chronic pain of both knees 11/25/2022   Tendinosis of left rotator cuff 09/15/2022   Cholelithiases 05/31/2020   Uterine fibroid 05/31/2020   OSA (obstructive sleep apnea) 06/15/2018   Severe recurrent major depression (HCC) 05/07/2016   Vitamin D  deficiency 09/18/2015   Essential hypertension, benign 10/08/2012   Health care maintenance 07/09/2012   Hyperlipidemia 06/16/2011      Objective:     BP (!) 141/67 (BP Location: Left Arm, Patient Position: Sitting, Cuff Size: Large)   Pulse 70   Temp 98 F (36.7 C) (Oral)   Ht 5' 4 (1.626 m)   Wt 191 lb 9.6 oz (86.9 kg)   SpO2 100% Comment: RA  BMI 32.89 kg/m  BP Readings from Last 3 Encounters:  04/19/24 (!) 141/67  03/30/24 (!) 147/77  03/03/24 125/67   Wt Readings from Last 3 Encounters:  04/19/24 191 lb 9.6 oz (86.9 kg)  03/30/24 203 lb 6.4 oz (92.3 kg)  03/03/24 206 lb 3.2 oz (93.5 kg)    Physical Exam Vitals reviewed.  Constitutional:      General: She is not in acute distress.    Appearance: Normal  appearance. She is not ill-appearing.  Cardiovascular:     Rate and Rhythm: Normal rate and regular rhythm.     Heart sounds: Normal heart sounds. No murmur heard. Pulmonary:     Effort: Pulmonary effort is normal. No respiratory distress.     Breath sounds: Normal breath sounds. No wheezing or rales.  Abdominal:     General: Abdomen is flat. There is no distension.     Palpations: Abdomen is soft.     Tenderness: There is no abdominal tenderness.  Musculoskeletal:     Right lower leg: No edema.     Left lower leg: No edema.     Comments: No swelling or deformity of bilateral thighs. No pain over bilateral trochanters. No pain with palpation of the thigh muscles.  Lymphadenopathy:     Cervical: No cervical adenopathy.     Upper Body:     Right upper body: No supraclavicular or axillary adenopathy.     Left upper body: No supraclavicular or axillary adenopathy.  Skin:    General: Skin is warm and dry.  Neurological:     Mental Status: She is alert.     Comments: Full ROM of bilateral arms. 5/5 strength with elbow flexion/extension. Gait slow but without gross abnormality. 5/5 strength in bilateral hip flexion, knee extension/flexion, ankle dorsi/plantarflexion.  Psychiatric:     Comments:  tearful       Assessment & Plan:   Problem List Items Addressed This Visit       Cardiovascular and Mediastinum   Essential hypertension, benign (Chronic)   BP above goal today, however Sarah [name] has not yet taken her BP medication. Advised her to continue taking home regimen.  Plan -Continue amlodipine  2.5 mg and hydrochlorothiazide  12.5 mg daily -F/u in 4 weeks        Respiratory   OSA (obstructive sleep apnea)   Chart history without sleep study. Previously referred but patient did not schedule. If no clear etiology for chronic fatigue identified, we will discuss referral for sleep study.        Other   Hyperlipidemia   Atorvastatin  increased from 40-80 mg in July after large  jump in total and LDL cholesterol noted on labs. Will recheck today.   Plan -lipid panel      Relevant Orders   Lipid Profile   Other fatigue - Primary   Chart history of chronic fatigue since 2018. However, Sarah [name] feels this has been worsening over the last 2-3 months. She has low energy and difficulty doing household tasks. She mentions that her thighs get very tired and sometimes painful with walking short distances. She has minimal dyspnea with exertion and denies chest pain, palpitations, swelling, orthopnea, PND, melena, hematochezia, bowel changes, N/V, LAD, bloating, changes in abdominal girth, or arm/shoulder weakness. She has noticed weight loss and today is 191 from 203 in mid-September. She does have a history of depression that had been improving at prior visits. Questionable history of sleep apnea per charting without evidence by sleep study.   We discussed several etiologies today and will start with lab work including CBC, iron studies, B12, TSH to evaluate for common causes. I also encouraged her to maintain UTD cancer screening by scheduling her mammogram and attending her upcoming GI appt. Uncertain the connection to her noted leg weakness and fatigue, strength intake on exam today. She did start a higher dose of statin in July for severely elevated LDL, will check CK and discuss possible break or switch of statin pending labs. She is very interested in physical therapy for strengthening and functional improvement which I agree with. Lower suspicion for cardiovascular cause given absence of several key features. Will f/u closely in one month and continue to monitor mood as well as discuss possible referral for sleep study. Trend weight.      Relevant Orders   Vitamin B12   TSH Rfx on Abnormal to Free T4   CBC with Diff   Iron, TIBC and Ferritin Panel   Comprehensive metabolic panel with GFR   Mass of upper outer quadrant of right breast   Provided again the information for  scheduling mammogram.      Other Visit Diagnoses       Muscle pain       Relevant Orders   CK   Ambulatory referral to Physical Therapy       Return in about 4 weeks (around 05/17/2024) for f/u fatigue.    Ronnald Sergeant, MD

## 2024-04-19 NOTE — Assessment & Plan Note (Addendum)
 Chart history of chronic fatigue since 2018. However, Ms. Gurka feels this has been worsening over the last 2-3 months. She has low energy and difficulty doing household tasks. She mentions that her thighs get very tired and sometimes painful with walking short distances. She has minimal dyspnea with exertion and denies chest pain, palpitations, swelling, orthopnea, PND, melena, hematochezia, bowel changes, N/V, LAD, bloating, changes in abdominal girth, or arm/shoulder weakness. She has noticed weight loss and today is 191 from 203 in mid-September. She does have a history of depression that had been improving at prior visits. Questionable history of sleep apnea per charting without evidence by sleep study.   We discussed several etiologies today and will start with lab work including CBC, iron studies, B12, TSH to evaluate for common causes. I also encouraged her to maintain UTD cancer screening by scheduling her mammogram and attending her upcoming GI appt. Uncertain the connection to her noted leg weakness and fatigue, strength intake on exam today. She did start a higher dose of statin in July for severely elevated LDL, will check CK and discuss possible break or switch of statin pending labs. She is very interested in physical therapy for strengthening and functional improvement which I agree with. Lower suspicion for cardiovascular cause given absence of several key features. Will f/u closely in one month and continue to monitor mood as well as discuss possible referral for sleep study. Trend weight.

## 2024-04-19 NOTE — Assessment & Plan Note (Signed)
 BP above goal today, however Sarah [name] has not yet taken her BP medication. Advised her to continue taking home regimen.  Plan -Continue amlodipine  2.5 mg and hydrochlorothiazide  12.5 mg daily -F/u in 4 weeks

## 2024-04-19 NOTE — Assessment & Plan Note (Addendum)
 Provided again the information for scheduling mammogram.

## 2024-04-19 NOTE — Assessment & Plan Note (Signed)
 Atorvastatin  increased from 40-80 mg in July after large jump in total and LDL cholesterol noted on labs. Will recheck today.   Plan -lipid panel

## 2024-04-19 NOTE — Patient Instructions (Signed)
 It was wonderful to see you today!  I will call you when all lab results are back.  Please call mammogram center to get this scheduled.  I have sent a referral to physical therapy. Please get this scheduled when they call.

## 2024-04-19 NOTE — Assessment & Plan Note (Signed)
 Chart history without sleep study. Previously referred but patient did not schedule. If no clear etiology for chronic fatigue identified, we will discuss referral for sleep study.

## 2024-04-20 LAB — COMPREHENSIVE METABOLIC PANEL WITH GFR
ALT: 10 IU/L (ref 0–32)
AST: 19 IU/L (ref 0–40)
Albumin: 4.3 g/dL (ref 3.8–4.8)
Alkaline Phosphatase: 100 IU/L (ref 49–135)
BUN/Creatinine Ratio: 9 — ABNORMAL LOW (ref 12–28)
BUN: 11 mg/dL (ref 8–27)
Bilirubin Total: 0.6 mg/dL (ref 0.0–1.2)
CO2: 27 mmol/L (ref 20–29)
Calcium: 9.9 mg/dL (ref 8.7–10.3)
Chloride: 95 mmol/L — ABNORMAL LOW (ref 96–106)
Creatinine, Ser: 1.17 mg/dL — ABNORMAL HIGH (ref 0.57–1.00)
Globulin, Total: 2.9 g/dL (ref 1.5–4.5)
Glucose: 90 mg/dL (ref 70–99)
Potassium: 3.2 mmol/L — ABNORMAL LOW (ref 3.5–5.2)
Sodium: 138 mmol/L (ref 134–144)
Total Protein: 7.2 g/dL (ref 6.0–8.5)
eGFR: 48 mL/min/1.73 — ABNORMAL LOW (ref >59)

## 2024-04-20 LAB — CBC WITH DIFFERENTIAL/PLATELET
Basophils Absolute: 0 x10E3/uL (ref 0.0–0.2)
Basos: 1 %
EOS (ABSOLUTE): 0.1 x10E3/uL (ref 0.0–0.4)
Eos: 2 %
Hematocrit: 40.3 % (ref 34.0–46.6)
Hemoglobin: 12.8 g/dL (ref 11.1–15.9)
Immature Grans (Abs): 0 x10E3/uL (ref 0.0–0.1)
Immature Granulocytes: 0 %
Lymphocytes Absolute: 1.4 x10E3/uL (ref 0.7–3.1)
Lymphs: 27 %
MCH: 28.6 pg (ref 26.6–33.0)
MCHC: 31.8 g/dL (ref 31.5–35.7)
MCV: 90 fL (ref 79–97)
Monocytes Absolute: 0.6 x10E3/uL (ref 0.1–0.9)
Monocytes: 12 %
Neutrophils Absolute: 3.2 x10E3/uL (ref 1.4–7.0)
Neutrophils: 58 %
Platelets: 273 x10E3/uL (ref 150–450)
RBC: 4.48 x10E6/uL (ref 3.77–5.28)
RDW: 14.4 % (ref 11.7–15.4)
WBC: 5.4 x10E3/uL (ref 3.4–10.8)

## 2024-04-20 LAB — IRON,TIBC AND FERRITIN PANEL
Ferritin: 549 ng/mL — ABNORMAL HIGH (ref 15–150)
Iron Saturation: 21 % (ref 15–55)
Iron: 52 ug/dL (ref 27–139)
Total Iron Binding Capacity: 251 ug/dL (ref 250–450)
UIBC: 199 ug/dL (ref 118–369)

## 2024-04-20 LAB — LIPID PANEL
Chol/HDL Ratio: 4 ratio (ref 0.0–4.4)
Cholesterol, Total: 157 mg/dL (ref 100–199)
HDL: 39 mg/dL — ABNORMAL LOW (ref >39)
LDL Chol Calc (NIH): 100 mg/dL — ABNORMAL HIGH (ref 0–99)
Triglycerides: 99 mg/dL (ref 0–149)
VLDL Cholesterol Cal: 18 mg/dL (ref 5–40)

## 2024-04-20 LAB — CK: Total CK: 167 U/L (ref 32–182)

## 2024-04-20 LAB — VITAMIN B12: Vitamin B-12: 326 pg/mL (ref 232–1245)

## 2024-04-20 LAB — T4F: T4,Free (Direct): 1.41 ng/dL (ref 0.82–1.77)

## 2024-04-20 LAB — TSH RFX ON ABNORMAL TO FREE T4: TSH: 5.32 u[IU]/mL — ABNORMAL HIGH (ref 0.450–4.500)

## 2024-04-21 ENCOUNTER — Ambulatory Visit: Payer: Self-pay | Admitting: Internal Medicine

## 2024-04-21 NOTE — Progress Notes (Signed)
 Stable kidney function. Mild hypokalemia. Unable to reach Sarah [name] x2 to review.

## 2024-04-21 NOTE — Progress Notes (Signed)
 Improvement in cholesterol with increase to atorvastatin  80 mg following last visit. Unable to review with Sarah [name] x2.

## 2024-04-21 NOTE — Telephone Encounter (Signed)
 Copied from CRM [id number]. Topic: Clinical - Lab/Test Results >> Apr 21, 2024  3:27 PM Debby BROCKS wrote: Reason for CRM: Patient would like to speak to Dr. Ronnald in regards to her blood work that was done on 04/19/2024

## 2024-04-21 NOTE — Progress Notes (Signed)
 Mild elevation in TSH, with normal fT4. May be reflective of normal aging changes, no recommendation for supplementation at this time. Unable to review with Ms. Mizner x2.

## 2024-04-21 NOTE — Progress Notes (Signed)
 B12 wnl, >300. Iron studies not consistent with deficiency, CBC wnl. CK wnl. Unable to reach Sarah [name] x2 to review.

## 2024-04-22 ENCOUNTER — Other Ambulatory Visit: Payer: Self-pay

## 2024-04-22 MED ORDER — POTASSIUM CHLORIDE CRYS ER 20 MEQ PO TBCR
20.0000 meq | EXTENDED_RELEASE_TABLET | Freq: Every day | ORAL | 0 refills | Status: DC
  Filled 2024-04-22: qty 3, 3d supply, fill #0

## 2024-04-26 ENCOUNTER — Encounter: Admitting: Internal Medicine

## 2024-04-28 ENCOUNTER — Encounter: Payer: Self-pay | Admitting: Physical Therapy

## 2024-04-28 ENCOUNTER — Ambulatory Visit: Attending: Internal Medicine | Admitting: Physical Therapy

## 2024-04-28 ENCOUNTER — Other Ambulatory Visit: Payer: Self-pay

## 2024-04-28 DIAGNOSIS — M6281 Muscle weakness (generalized): Secondary | ICD-10-CM | POA: Diagnosis present

## 2024-04-28 DIAGNOSIS — R2689 Other abnormalities of gait and mobility: Secondary | ICD-10-CM | POA: Diagnosis present

## 2024-04-28 DIAGNOSIS — M79651 Pain in right thigh: Secondary | ICD-10-CM | POA: Insufficient documentation

## 2024-04-28 DIAGNOSIS — M79652 Pain in left thigh: Secondary | ICD-10-CM | POA: Insufficient documentation
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
## 2024-05-02 ENCOUNTER — Emergency Department (HOSPITAL_COMMUNITY)
Admission: EM | Admit: 2024-05-02 | Discharge: 2024-05-02 | Disposition: A | Discharge status: Home / Self Care | Attending: Emergency Medicine | Admitting: Emergency Medicine

## 2024-05-02 ENCOUNTER — Emergency Department (HOSPITAL_COMMUNITY)

## 2024-05-02 ENCOUNTER — Other Ambulatory Visit: Payer: Self-pay

## 2024-05-02 ENCOUNTER — Ambulatory Visit

## 2024-05-02 DIAGNOSIS — W1809XA Striking against other object with subsequent fall, initial encounter: Secondary | ICD-10-CM | POA: Insufficient documentation

## 2024-05-02 DIAGNOSIS — R2689 Other abnormalities of gait and mobility: Secondary | ICD-10-CM

## 2024-05-02 DIAGNOSIS — Z23 Encounter for immunization: Secondary | ICD-10-CM | POA: Diagnosis not present

## 2024-05-02 DIAGNOSIS — M542 Cervicalgia: Secondary | ICD-10-CM | POA: Diagnosis present

## 2024-05-02 DIAGNOSIS — M6281 Muscle weakness (generalized): Secondary | ICD-10-CM

## 2024-05-02 DIAGNOSIS — M79652 Pain in left thigh: Secondary | ICD-10-CM

## 2024-05-02 DIAGNOSIS — R0602 Shortness of breath: Secondary | ICD-10-CM | POA: Diagnosis not present

## 2024-05-02 DIAGNOSIS — S0083XA Contusion of other part of head, initial encounter: Secondary | ICD-10-CM | POA: Diagnosis not present

## 2024-05-02 DIAGNOSIS — M79651 Pain in right thigh: Secondary | ICD-10-CM

## 2024-05-02 DIAGNOSIS — W19XXXA Unspecified fall, initial encounter: Secondary | ICD-10-CM

## 2024-05-02 MED ORDER — TETANUS-DIPHTH-ACELL PERTUSSIS 5-2-15.5 LF-MCG/0.5 IM SUSP
0.5000 mL | Freq: Once | INTRAMUSCULAR | Status: AC
Start: 2024-05-02 — End: 2024-05-02
  Administered 2024-05-02: 0.5 mL via INTRAMUSCULAR
  Filled 2024-05-02: qty 0.5

## 2024-05-02 MED ORDER — ACETAMINOPHEN 500 MG PO TABS
1000.0000 mg | ORAL_TABLET | ORAL | Status: AC
  Administered 2024-05-02: 1000 mg via ORAL
  Filled 2024-05-02: qty 2

## 2024-05-02 NOTE — ED Provider Notes (Signed)
 [place] EMERGENCY DEPARTMENT AT [facility] Provider Note   CSN: [id number] Arrival date & time: 05/02/24  1340     Patient presents with: Sarah [name] is a 78 y.o. female.   78 yo F with a chief complaints of a fall.  Patient tells me for the at least the past 6 months she gets fatigued very easily and gets pain to both of her quadriceps.  She usually takes a break and feels better and is soon as she gets up and tries to walk again and it occurs again.  It is frustrating for her because it is hard for her to do things that she likes to do.  She has seen her family doctor for this and there have been multiple labs drawn but no obvious etiology of her symptoms.  She went to physical therapy today which was prescribed by her family doctor for this problem.  She did a full physical therapy session for the first time went and got some tomatoes and then was going to go see the dermatologist when she felt like the pain got too bad in her legs to hold her up and she collapsed to the ground.  She struck the front of her head.  Has some pain to the neck as well.  Denies pain to the chest abdomen and back.  Has no pain in the legs while she is sitting.   Fall       Prior to Admission medications   Medication Sig Start Date End Date Taking? Authorizing Provider  amLODipine  (NORVASC ) 2.5 MG tablet Take 1 tablet (2.5 mg total) by mouth daily. 01/26/24   Karna Fellows, MD  atorvastatin  (LIPITOR) 80 MG tablet Take 1 tablet (80 mg total) by mouth daily. 03/30/24   Charmayne Holmes, DO  Blood Pressure Monitoring (BLOOD PRESSURE CUFF) MISC 1 Act by Does not apply route daily. 03/30/24   Charmayne Holmes, DO  buPROPion  (WELLBUTRIN  XL) 150 MG 24 hr tablet Take 1 tablet (150 mg total) by mouth every morning. 01/26/24 04/28/24  Karna Fellows, MD  busPIRone  (BUSPAR ) 5 MG tablet Take 1 tablet (5 mg total) by mouth 3 (three) times daily as needed. 01/26/24   Karna Fellows, MD  diclofenac  Sodium (VOLTAREN   ARTHRITIS PAIN) 1 % GEL Apply 4 g topically 4 (four) times daily. 07/28/23   Karna Fellows, MD  gabapentin  (NEURONTIN ) 300 MG capsule Take 2 capsules (600 mg total) by mouth at bedtime. 03/30/24 04/29/24  Charmayne Holmes, DO  hydrochlorothiazide  (HYDRODIURIL ) 12.5 MG tablet Take 1 tablet (12.5 mg total) by mouth daily. 01/26/24   Karna Fellows, MD  melatonin 3 MG TABS tablet Take one tablet 1-2 hours before bedtime. 01/26/24   Karna Fellows, MD  potassium chloride  SA (KLOR-CON  M) 20 MEQ tablet Take 1 tablet (20 mEq total) by mouth daily for 3 days. 04/22/24 04/25/24  Karna Fellows, MD  sertraline  (ZOLOFT ) 100 MG tablet Take 1.5 tablets (150 mg total) by mouth daily. 03/30/24   Lyles, Holmes, DO  Vitamin D , Cholecalciferol , 25 MCG (1000 UT) CAPS Take 1 capsule by mouth daily. 03/30/24   Charmayne Holmes, DO    Allergies: Patient has no known allergies.    Review of Systems  Updated Vital Signs BP (!) 145/67 (BP Location: Right Arm)   Pulse 80   Temp 98.3 F (36.8 C)   Resp 18   Ht 5' 4 (1.626 m)   Wt 86.9 kg   SpO2 97%   BMI  32.89 kg/m   Physical Exam Vitals and nursing note reviewed.  Constitutional:      General: She is not in acute distress.    Appearance: She is well-developed. She is not diaphoretic.  HENT:     Head: Normocephalic and atraumatic.  Eyes:     Pupils: Pupils are equal, round, and reactive to light.  Cardiovascular:     Rate and Rhythm: Normal rate and regular rhythm.     Heart sounds: No murmur heard.    No friction rub. No gallop.  Pulmonary:     Effort: Pulmonary effort is normal.     Breath sounds: No wheezing or rales.  Abdominal:     General: There is no distension.     Palpations: Abdomen is soft.     Tenderness: There is no abdominal tenderness.  Musculoskeletal:        General: No tenderness.     Cervical back: Normal range of motion and neck supple.     Comments: Patient's feet bilaterally are very cold to touch though has very strong pulses bilaterally.  Pulse  motor and sensation intact.  Palpated from head to toe without any obvious other noted areas of bony tenderness.  Skin:    General: Skin is warm and dry.  Neurological:     Mental Status: She is alert and oriented to person, place, and time.  Psychiatric:        Behavior: Behavior normal.     (all labs ordered are listed, but only abnormal results are displayed) Labs Reviewed - No data to display  EKG: None  Radiology: CT Head Wo Contrast Result Date: 05/02/2024 CLINICAL DATA:  Fall, struck forehead EXAM: CT HEAD WITHOUT CONTRAST CT CERVICAL SPINE WITHOUT CONTRAST TECHNIQUE: Multidetector CT imaging of the head and cervical spine was performed following the standard protocol without intravenous contrast. Multiplanar CT image reconstructions of the cervical spine were also generated. RADIATION DOSE REDUCTION: This exam was performed according to the departmental dose-optimization program which includes automated exposure control, adjustment of the mA and/or kV according to patient size and/or use of iterative reconstruction technique. COMPARISON:  04/27/2023 FINDINGS: CT HEAD FINDINGS Brain: No evidence of acute infarction, hemorrhage, hydrocephalus, extra-axial collection or mass lesion/mass effect. Vascular: No hyperdense vessel or unexpected calcification. Skull: Hyperostosis frontalis. Negative for fracture or focal lesion. Sinuses/Orbits: No acute finding. Other: Soft tissue contusion of the left forehead (series 3, image 11). CT CERVICAL SPINE FINDINGS Alignment: Degenerative straightening of the normal cervical lordosis. Skull base and vertebrae: No acute fracture. No primary bone lesion or focal pathologic process. Soft tissues and spinal canal: No prevertebral fluid or swelling. No visible canal hematoma. Disc levels: Moderate multilevel cervical disc degenerative disease, as well as ankylosis of C5-C6. Upper chest: Negative. Other: None. IMPRESSION: 1. No acute intracranial pathology.  2. Soft tissue contusion of the left forehead. 3. No fracture or static subluxation of the cervical spine. 4. Moderate multilevel cervical disc degenerative disease, as well as ankylosis of C5-C6. Electronically Signed   By: Marolyn JONETTA Jaksch M.D.   On: 05/02/2024 15:03   CT Cervical Spine Wo Contrast Result Date: 05/02/2024 CLINICAL DATA:  Fall, struck forehead EXAM: CT HEAD WITHOUT CONTRAST CT CERVICAL SPINE WITHOUT CONTRAST TECHNIQUE: Multidetector CT imaging of the head and cervical spine was performed following the standard protocol without intravenous contrast. Multiplanar CT image reconstructions of the cervical spine were also generated. RADIATION DOSE REDUCTION: This exam was performed according to the departmental dose-optimization program which includes automated  exposure control, adjustment of the mA and/or kV according to patient size and/or use of iterative reconstruction technique. COMPARISON:  04/27/2023 FINDINGS: CT HEAD FINDINGS Brain: No evidence of acute infarction, hemorrhage, hydrocephalus, extra-axial collection or mass lesion/mass effect. Vascular: No hyperdense vessel or unexpected calcification. Skull: Hyperostosis frontalis. Negative for fracture or focal lesion. Sinuses/Orbits: No acute finding. Other: Soft tissue contusion of the left forehead (series 3, image 11). CT CERVICAL SPINE FINDINGS Alignment: Degenerative straightening of the normal cervical lordosis. Skull base and vertebrae: No acute fracture. No primary bone lesion or focal pathologic process. Soft tissues and spinal canal: No prevertebral fluid or swelling. No visible canal hematoma. Disc levels: Moderate multilevel cervical disc degenerative disease, as well as ankylosis of C5-C6. Upper chest: Negative. Other: None. IMPRESSION: 1. No acute intracranial pathology. 2. Soft tissue contusion of the left forehead. 3. No fracture or static subluxation of the cervical spine. 4. Moderate multilevel cervical disc degenerative  disease, as well as ankylosis of C5-C6. Electronically Signed   By: Marolyn JONETTA Jaksch M.D.   On: 05/02/2024 15:03     Procedures   Medications Ordered in the ED  Tdap (ADACEL) injection 0.5 mL (0.5 mLs Intramuscular Given 05/02/24 1432)  acetaminophen  (TYLENOL ) tablet 1,000 mg (1,000 mg Oral Given 05/02/24 1431)                                    Medical Decision Making  78 yo F with a chief complaints of a fall.  Patient said for 6 months she has been having pain to bilateral legs when she gets up and tries to walk.  No chest pain but does have some difficulty breathing at times.  Usually she sits and goes away pretty quickly and then she gets up and occurs again.  She has seen her family doctor for this and is currently starting physical therapy and had her first session today.  I reviewed the patient's medical record, she has recently seen her family doctor.  Had recent blood work drawn including thyroid  studies without any obvious acute cause of her symptoms.  Her feet are very cold but she has good pulses.  Seems less likely to be claudication with it mostly in the quadriceps muscles.  I wonder if she would benefit from vascular study.  She has some shortness of breath with exertion.  Seems unlikely to be an acute cardiac issue but may benefit from referral.  CT of the head and C-spine here without obvious intracranial intraspinal pathology.  I discussed results with the patient.  Will discharge home.  PCP follow-up.  6:07 PM:  I have discussed the diagnosis/risks/treatment options with the patient.  Evaluation and diagnostic testing in the emergency department does not suggest an emergent condition requiring admission or immediate intervention beyond what has been performed at this time.  They will follow up with PCP. We also discussed returning to the ED immediately if new or worsening sx occur. We discussed the sx which are most concerning (e.g., sudden worsening pain, fever,  inability to tolerate by mouth) that necessitate immediate return. Medications administered to the patient during their visit and any new prescriptions provided to the patient are listed below.  Medications given during this visit Medications  Tdap (ADACEL) injection 0.5 mL (0.5 mLs Intramuscular Given 05/02/24 1432)  acetaminophen  (TYLENOL ) tablet 1,000 mg (1,000 mg Oral Given 05/02/24 1431)     The patient appears reasonably screen  and/or stabilized for discharge and I doubt any other medical condition or other [facility] requiring further screening, evaluation, or treatment in the ED at this time prior to discharge.       Final diagnoses:  Fall, initial encounter    ED Discharge Orders     None          Emil Share, DO 05/02/24 [id number]

## 2024-05-02 NOTE — ED Triage Notes (Signed)
 Patient arrives via [facility] for fall, struck forehead on rocks. No thinners. Ground level fall. Patient endorses her leg giving out ands falling.   EMS vitals 140/72 BP 97 on room air HR 98 RR 17  No LOC, recalls event entirely. No head neck or back pain. Swelling under left eye.

## 2024-05-02 NOTE — Discharge Instructions (Signed)
 Your CT scan of your head and of your neck do not show any broken bones in the head or neck, there is no bleeding inside the skull.  As we discussed no test is perfect please return for sudden worsening headache confusion or vomiting.  Whenever you fall down you should let your family doctor know.  They need to look at your medications and to make sure there is no one that they can remove, they may want to order more test to try and figure out why you are having this pain in your legs.  They may want to refer you to a cardiologist or vascular surgeon.

## 2024-05-02 NOTE — ED Provider Triage Note (Signed)
 Emergency Medicine Provider Triage Evaluation Note  Sarah [name] , a 78 y.o. female  was evaluated in triage.  Pt complains of head injury. Says that she got tired while walking and went to a tomato stand and on the way back to her car she fell. No preceding symptoms.   Review of Systems  Positive: Neck pain Negative: No arm or leg pain, no hip pain  Physical Exam  BP (!) 145/67 (BP Location: Right Arm)   Pulse 80   Temp 98.3 F (36.8 C)   Resp 18   Ht 5' 4 (1.626 m)   Wt 86.9 kg   SpO2 97%   BMI 32.89 kg/m  Gen:   Awake, no distress  Resp:  Normal effort  MSK:   Moves extremities without difficulty  Other:  Small laceration to forehead  Medical Decision Making  Medically screening exam initiated at 2:22 PM.  Appropriate orders placed.  Sarah [name] was informed that the remainder of the evaluation will be completed by another provider, this initial triage assessment does not replace that evaluation, and the importance of remaining in the ED until their evaluation is complete.   Sarah Lamar BROCKS, MD 05/02/24 [phone]

## 2024-05-04 ENCOUNTER — Telehealth: Payer: Self-pay | Admitting: *Deleted

## 2024-05-04 NOTE — Telephone Encounter (Signed)
 Copied from CRM #[id number]. Topic: Referral - Question >> May 04, 2024  3:41 PM Miquel SAILOR wrote: Reason for CRM: Ambulatory referral to Gastroenterology (Order # [id number]) on 02/04/2024-PT has Colonoscopy 10/23 but no instructions what she has to to do the day before her procedure. Gave info and transferred to location   Gastroenterologist in [place], [place]  Located in: [facility] MANO [facility] [address] Address: [address], [place], [place] [address] Phone: [phone]

## 2024-05-05 ENCOUNTER — Ambulatory Visit: Admitting: Gastroenterology
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
## 2024-05-12 ENCOUNTER — Other Ambulatory Visit: Payer: Self-pay | Admitting: Licensed Clinical Social Worker

## 2024-05-13 ENCOUNTER — Telehealth: Admitting: Licensed Clinical Social Worker

## 2024-05-13 ENCOUNTER — Ambulatory Visit

## 2024-05-13 DIAGNOSIS — R2689 Other abnormalities of gait and mobility: Secondary | ICD-10-CM

## 2024-05-13 DIAGNOSIS — M79652 Pain in left thigh: Secondary | ICD-10-CM

## 2024-05-13 DIAGNOSIS — M6281 Muscle weakness (generalized): Secondary | ICD-10-CM

## 2024-05-13 DIAGNOSIS — M79651 Pain in right thigh: Secondary | ICD-10-CM
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
## 2024-05-16 ENCOUNTER — Ambulatory Visit (INDEPENDENT_AMBULATORY_CARE_PROVIDER_SITE_OTHER)

## 2024-05-16 ENCOUNTER — Ambulatory Visit: Payer: Self-pay | Admitting: *Deleted

## 2024-05-16 ENCOUNTER — Other Ambulatory Visit: Payer: Self-pay

## 2024-05-16 ENCOUNTER — Ambulatory Visit: Attending: Internal Medicine

## 2024-05-16 VITALS — BP 145/77 | HR 85 | Temp 98.9°F | Ht 64.0 in | Wt 181.2 lb

## 2024-05-16 DIAGNOSIS — M79652 Pain in left thigh: Secondary | ICD-10-CM

## 2024-05-16 DIAGNOSIS — R2689 Other abnormalities of gait and mobility: Secondary | ICD-10-CM | POA: Diagnosis present

## 2024-05-16 DIAGNOSIS — R630 Anorexia: Secondary | ICD-10-CM | POA: Insufficient documentation

## 2024-05-16 DIAGNOSIS — M79651 Pain in right thigh: Secondary | ICD-10-CM | POA: Insufficient documentation

## 2024-05-16 DIAGNOSIS — N1831 Chronic kidney disease, stage 3a: Secondary | ICD-10-CM | POA: Diagnosis not present

## 2024-05-16 DIAGNOSIS — E785 Hyperlipidemia, unspecified: Secondary | ICD-10-CM | POA: Diagnosis not present

## 2024-05-16 DIAGNOSIS — R5383 Other fatigue: Secondary | ICD-10-CM

## 2024-05-16 DIAGNOSIS — R634 Abnormal weight loss: Secondary | ICD-10-CM | POA: Insufficient documentation

## 2024-05-16 MED ORDER — ROSUVASTATIN CALCIUM 40 MG PO TABS
40.0000 mg | ORAL_TABLET | Freq: Every day | ORAL | 11 refills | Status: DC
  Filled 2024-05-16: qty 30, 30d supply, fill #0

## 2024-05-16 MED ORDER — MIRTAZAPINE 15 MG PO TABS
15.0000 mg | ORAL_TABLET | Freq: Every day | ORAL | 2 refills | Status: DC
  Filled 2024-05-16: qty 30, 30d supply, fill #0

## 2024-05-16 NOTE — Assessment & Plan Note (Signed)
 Chronic, ongoing and worsening. May be secondary to statin myopathy, however want to rule out any potential for aorto-iliac claudication.   -US  ABIs

## 2024-05-16 NOTE — Patient Instructions (Addendum)
 Thank you, Ms.Naomie DELENA Potters for allowing us  to provide your care today. Today we discussed your symptoms.    Stop taking Buspar  for now while you take the new Mirtazapine medicine to help with your appetite.   New medications: - Crestor 40mg  for your cholesterol  - Mirtazapine 15mg , take once nightly to help with your appetite. If you notice new symptoms like increased restlessness, confusion, palpitations (rapid heart rate), sweating, diarrhea, headaches, shivering, tremors, or high fevers, stop this medicine and call our office.  I have ordered the following labs for you:  Lab Orders         Sed Rate (ESR)         CRP (C-Reactive Protein)         Basic metabolic panel with GFR       I will call if any are abnormal. All of your labs can be accessed through My Chart.   My Chart Access: [email or web address]  Please follow-up in: 1 month with Dr. Karna to follow up on your symptoms    We look forward to seeing you next time. Please call our clinic at [phone] if you have any questions or concerns. The best time to call is Monday-Friday from 9am-4pm, but there is someone available 24/7. If after hours or the weekend, call the main hospital number and ask for the Internal Medicine Resident On-Call. If you need medication refills, please notify your pharmacy one week in advance and they will send us  a request.   Thank you for letting us  take part in your care. Wishing you the best!  Taya Ashbaugh, DO 05/16/2024, 5:06 PM [facility]

## 2024-05-16 NOTE — Assessment & Plan Note (Signed)
>>  ASSESSMENT AND PLAN FOR LOSS OF APPETITE WRITTEN ON 05/16/2024  6:06 PM BY AMILIBIA, JADEN, DO  Acute, present for the past month. Has had a 10-pound weight loss since her visit with Dr. Karna on 04/19/2024. States she has no appetite, and has no desire to want to eat even her favorite foods. She will drink fluids without issue, but still has no desire to intake much. Denies globus sensation, difficulty or pain with swallowing, changes in taste, anosmia, heartburn, or any reflux symptoms. She does endorse some nausea, and stated she had an episode where she felt like throwing up but was unable to. She is scheduled to see GI on 11/12 for colonoscopy, will reach out to provider to potentially consider EGD as well to rule out any esophageal or gastric cause for her loss of appetite. Will order CT CAP to rule out any malignant causes and will start Remeron  15mg  nightly to help stimulate appetite. Counseled on serotonergic side effects given concurrent Wellbutrin  and Zoloft  use. Will hold Buspar  at this time. Patient voiced understanding.  -GI to see on 11/12, will follow closely. -CT Chest Abdomen and Pelvis -Remeron  15mg  nightly to stimulate appetite -Follow up in 1 month with Dr. Karna to closely monitor symptoms

## 2024-05-16 NOTE — Assessment & Plan Note (Addendum)
 BMP 3 months showed GFR 50, Cr 1.14. Given chronic Amlodipine  and hydrochlorothiazide  use, will recheck BMP.  -BMP today

## 2024-05-16 NOTE — Telephone Encounter (Signed)
 FYI Only or Action Required?: FYI only for provider: appointment scheduled on 11/3.  Patient was last seen in primary care on 04/19/2024 by Karna Fellows, MD.  Called Nurse Triage reporting Fatigue.  Symptoms began several months ago.  Interventions attempted: Nothing.  Symptoms are: gradually worsening.  Triage Disposition: See HCP Within 4 Hours (Or PCP Triage)  Patient/caregiver understands and will follow disposition?: Yes  Copied from CRM [id number]. Topic: Clinical - Red Word Triage >> May 16, 2024 11:26 AM Mercer PEDLAR wrote: Red Word that prompted transfer to Nurse Triage: No energy or appitite, had a fall two weeks ago and was seen at ER. Reason for Disposition  [1] MODERATE weakness (e.g., interferes with work, school, normal activities) AND [2] cause unknown  (Exceptions: Weakness from acute minor illness or poor fluid intake; weakness is chronic and not worse.)  Answer Assessment - Initial Assessment Questions 1. DESCRIPTION: Describe how you are feeling.     Unable to walk-gets tired quick 2. SEVERITY: How bad is it?  Can you stand and walk?     Patient reports she is losing weight- no appetite  3. ONSET: When did these symptoms begin? (e.g., hours, days, weeks, months)     6 months 4. CAUSE: What do you think is causing the weakness or fatigue? (e.g., not drinking enough fluids, medical problem, trouble sleeping)     No appetite  5. NEW MEDICINES:  Have you started on any new medicines recently? (e.g., opioid pain medicines, benzodiazepines, muscle relaxants, antidepressants, antihistamines, neuroleptics, beta blockers)     Increased cholesterol medication 6. OTHER SYMPTOMS: Do you have any other symptoms? (e.g., chest pain, fever, cough, SOB, vomiting, diarrhea, bleeding, other areas of pain)     Only hurts when walking- in thigh  Protocols used: Weakness (Generalized) and Fatigue-A-AH

## 2024-05-16 NOTE — Assessment & Plan Note (Addendum)
 Acute, present for the past month. Has had a 10-pound weight loss since her visit with Dr. Karna on 04/19/2024. States she has no appetite, and has no desire to want to eat even her favorite foods. She will drink fluids without issue, but still has no desire to intake much. Denies globus sensation, difficulty or pain with swallowing, changes in taste, anosmia, heartburn, or any reflux symptoms. She does endorse some nausea, and stated she had an episode where she felt like throwing up but was unable to. She is scheduled to see GI on 11/12 for colonoscopy, will reach out to provider to potentially consider EGD as well to rule out any esophageal or gastric cause for her loss of appetite. Will order CT CAP to rule out any malignant causes and will start Remeron 15mg  nightly to help stimulate appetite. Counseled on serotonergic side effects given concurrent Wellbutrin  and Zoloft  use. Will hold Buspar  at this time. Patient voiced understanding.  -GI to see on 11/12, will follow closely. -CT Chest Abdomen and Pelvis -Remeron 15mg  nightly to stimulate appetite -Follow up in 1 month with Dr. Karna to closely monitor symptoms

## 2024-05-16 NOTE — Progress Notes (Signed)
 Acute Office Visit  Subjective:     Patient ID: Sarah [name], female    DOB: 12-Jan-1946, 78 y.o.   MRN: [id number]  Chief Complaint  Patient presents with   Fatigue    FEELS TIRED / NO ENERGY / NO APPETITE / MEDICATION REFILL /PAIN WHEN STANDING. # 7    Sarah [name] is a 78 year old female with a past medical history of CKD Stage 3a, prediabetes, OSA, hypertension, hyperlipidemia, and severe recurrent major depression who presents to clinic today for an acute visit regarding ongoing fatigue and lack of appetite. She was last seen by Dr. Karna on 10/7 for follow up on her chronic conditions, where her fatigue was then discussed. Please see problem-based assessment and plan below for further details.     Review of Systems  Constitutional:  Positive for malaise/fatigue and weight loss. Negative for chills and fever.       Unintentional 10 pound weight loss  HENT: Negative.    Eyes: Negative.   Respiratory: Negative.    Cardiovascular: Negative.   Gastrointestinal:  Positive for nausea. Negative for abdominal pain, constipation, diarrhea, heartburn and vomiting.       Loss of appetite with concurrent 10-lb weight loss  Genitourinary: Negative.   Musculoskeletal:  Positive for falls and myalgias. Negative for back pain and joint pain.       Bilateral thigh pain  Neurological:  Negative for dizziness, tingling, tremors, sensory change, speech change, focal weakness and headaches.  Psychiatric/Behavioral:  Positive for depression.        Objective:    BP (!) 145/77 (BP Location: Left Arm, Patient Position: Sitting, Cuff Size: Normal)   Pulse 85   Temp 98.9 F (37.2 C) (Oral)   Ht 5' 4 (1.626 m)   Wt 181 lb 3.2 oz (82.2 kg)   BMI 31.10 kg/m   Physical Exam Constitutional:      Comments: Tired-appearing female  HENT:     Mouth/Throat:     Mouth: Mucous membranes are dry.  Cardiovascular:     Rate and Rhythm: Normal rate and regular rhythm.     Pulses: Normal pulses.   Pulmonary:     Effort: Pulmonary effort is normal.     Breath sounds: Normal breath sounds.  Abdominal:     General: Abdomen is flat.     Palpations: Abdomen is soft.  Musculoskeletal:        General: No swelling, tenderness or deformity. Normal range of motion.     Cervical back: Normal range of motion.     Right lower leg: No edema.     Left lower leg: No edema.     Comments: Bilateral L and R 5/5 hip flexion, knee extension and flexion. Plantar/dorsiflexion of bilateral feet normal. No tenderness to palpation, sensation normal.  Skin:    General: Skin is warm and dry.  Neurological:     General: No focal deficit present.     Mental Status: She is oriented to person, place, and time.  Psychiatric:        Mood and Affect: Mood normal.        Behavior: Behavior normal.        Assessment & Plan:   Patient seen with Dr. Ronnald Karna.  Problem List Items Addressed This Visit       Genitourinary   Stage 3a chronic kidney disease (HCC)   BMP 3 months showed GFR 50, Cr 1.14. Given chronic Amlodipine  and hydrochlorothiazide  use, will recheck BMP.  -  BMP today      Relevant Orders   Basic metabolic panel with GFR     Other   Hyperlipidemia   Patient previously on Atorvastatin  80mg , which was increased from 40mg  after a large increase in LDL. Patient has been having persistent bilateral thigh pain that has been present and worsening since increasing the dose of her statin. Labwork has largely been negative. Will switch Atorvastatin  to Crestor 40mg , as patient still needs statin therapy.  - Stop Lipitor 80mg  - Start Crestor 40mg  daily      Relevant Medications   rosuvastatin (CRESTOR) 40 MG tablet   Other fatigue - Primary   Last seen on 10/7 by Dr. Karna for persistently worsening fatigue in the past 4 months. CBC, iron studies, B12, TSH, CK were all checked at this time. Vitamin B12, CBC, CK, iron studies WNL. TSH showed mild elevation but normal fT4 and did not necessitate  Levothyroxine initiation. Was started with PT for concurrent leg weakness, and she has attended a few sessions. Will send home health PT orders today for continued PT benefits. She states her pain is continually present, and has been worsening to the point she is unable to ambulate distances without pain. Her symptoms are making her anxious, but she is unsure if her mood is affecting her thigh pain and fatigue. Will stop Atorvastatin  80mg . Will order bilateral ABIs, as patient may have aorto-iliac claudication causing her pain. Will also order ESR and CRP to rule out polymyalgia rheumatica, however patient denies upper body weakness, headaches, or vision changes.  -US  bilateral ABIs  -HH PT  -ESR, CRP      Relevant Orders   Sed Rate (ESR)   CRP (C-Reactive Protein)   Home Health   Face-to-face encounter (required for Medicare/Medicaid patients)   Loss of appetite   Acute, present for the past month. Has had a 10-pound weight loss since her visit with Dr. Karna on 04/19/2024. States she has no appetite, and has no desire to want to eat even her favorite foods. She will drink fluids without issue, but still has no desire to intake much. Denies globus sensation, difficulty or pain with swallowing, changes in taste, anosmia, heartburn, or any reflux symptoms. She does endorse some nausea, and stated she had an episode where she felt like throwing up but was unable to. She is scheduled to see GI on 11/12 for colonoscopy, will reach out to provider to potentially consider EGD as well to rule out any esophageal or gastric cause for her loss of appetite. Will order CT CAP to rule out any malignant causes and will start Remeron 15mg  nightly to help stimulate appetite. Counseled on serotonergic side effects given concurrent Wellbutrin  and Zoloft  use. Will hold Buspar  at this time. Patient voiced understanding.  -GI to see on 11/12, will follow closely. -CT Chest Abdomen and Pelvis -Remeron 15mg  nightly to  stimulate appetite -Follow up in 1 month with Dr. Karna to closely monitor symptoms      Relevant Medications   mirtazapine (REMERON) 15 MG tablet   Other Relevant Orders   CT CHEST ABDOMEN PELVIS W CONTRAST   Bilateral thigh pain   Chronic, ongoing and worsening. May be secondary to statin myopathy, however want to rule out any potential for aorto-iliac claudication.   -US  ABIs       Relevant Orders   VAS US  ABI WITH/WO TBI   Home Health    Meds ordered this encounter  Medications   rosuvastatin (CRESTOR) 40  MG tablet    Sig: Take 1 tablet (40 mg total) by mouth daily.    Dispense:  30 tablet    Refill:  11   mirtazapine (REMERON) 15 MG tablet    Sig: Take 1 tablet (15 mg total) by mouth at bedtime.    Dispense:  30 tablet    Refill:  2    Return in about 1 month (around 06/15/2024) for follow up on symptoms and management with Dr. Karna.  Romulo Okray, DO Internal Medicine Resident, PGY-1 6:06 PM 05/16/2024

## 2024-05-16 NOTE — Assessment & Plan Note (Signed)
 Patient previously on Atorvastatin  80mg , which was increased from 40mg  after a large increase in LDL. Patient has been having persistent bilateral thigh pain that has been present and worsening since increasing the dose of her statin. Labwork has largely been negative. Will switch Atorvastatin  to Crestor 40mg , as patient still needs statin therapy.  - Stop Lipitor 80mg  - Start Crestor 40mg  daily

## 2024-05-16 NOTE — Assessment & Plan Note (Addendum)
 Last seen on 10/7 by Dr. Karna for persistently worsening fatigue in the past 4 months. CBC, iron studies, B12, TSH, CK were all checked at this time. Vitamin B12, CBC, CK, iron studies WNL. TSH showed mild elevation but normal fT4 and did not necessitate Levothyroxine initiation. Was started with PT for concurrent leg weakness, and she has attended a few sessions. Will send home health PT orders today for continued PT benefits. She states her pain is continually present, and has been worsening to the point she is unable to ambulate distances without pain. Her symptoms are making her anxious, but she is unsure if her mood is affecting her thigh pain and fatigue. Will stop Atorvastatin  80mg . Will order bilateral ABIs, as patient may have aorto-iliac claudication causing her pain. Will also order ESR and CRP to rule out polymyalgia rheumatica, however patient denies upper body weakness, headaches, or vision changes.  -US  bilateral ABIs  -HH PT  -ESR, CRP

## 2024-05-17 ENCOUNTER — Other Ambulatory Visit: Payer: Self-pay

## 2024-05-17 ENCOUNTER — Other Ambulatory Visit: Payer: Self-pay | Admitting: Internal Medicine

## 2024-05-17 ENCOUNTER — Ambulatory Visit: Payer: Self-pay

## 2024-05-17 ENCOUNTER — Ambulatory Visit: Admitting: Student

## 2024-05-17 ENCOUNTER — Ambulatory Visit: Admitting: Internal Medicine

## 2024-05-17 DIAGNOSIS — E876 Hypokalemia: Secondary | ICD-10-CM

## 2024-05-17 DIAGNOSIS — M79651 Pain in right thigh: Secondary | ICD-10-CM

## 2024-05-17 LAB — BASIC METABOLIC PANEL WITH GFR
BUN/Creatinine Ratio: 14 (ref 12–28)
BUN: 11 mg/dL (ref 8–27)
CO2: 24 mmol/L (ref 20–29)
Calcium: 9.8 mg/dL (ref 8.7–10.3)
Chloride: 94 mmol/L — ABNORMAL LOW (ref 96–106)
Creatinine, Ser: 0.8 mg/dL (ref 0.57–1.00)
Glucose: 75 mg/dL (ref 70–99)
Potassium: 3.3 mmol/L — ABNORMAL LOW (ref 3.5–5.2)
Sodium: 138 mmol/L (ref 134–144)
eGFR: 76 mL/min/1.73 (ref >59)

## 2024-05-17 LAB — SEDIMENTATION RATE: Sed Rate: 39 mm/h (ref 0–40)

## 2024-05-17 LAB — C-REACTIVE PROTEIN: CRP: 9 mg/L (ref 0–10)

## 2024-05-17 MED ORDER — POTASSIUM CHLORIDE CRYS ER 20 MEQ PO TBCR
20.0000 meq | EXTENDED_RELEASE_TABLET | Freq: Every day | ORAL | 0 refills | Status: DC
  Filled 2024-05-17: qty 2, 2d supply, fill #0

## 2024-05-17 NOTE — Progress Notes (Signed)
 ESR/CRP WNL. Mild hypokalemia again, will send for an additional Kcl 20mEq once daily for 2 days. Likely secondary to decreased intake + concurrent hydrochlorothiazide  use. Spoke with patient who voiced understanding.
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
## 2024-05-18 ENCOUNTER — Ambulatory Visit

## 2024-05-18 DIAGNOSIS — M79652 Pain in left thigh: Secondary | ICD-10-CM | POA: Diagnosis not present

## 2024-05-18 DIAGNOSIS — M79651 Pain in right thigh: Secondary | ICD-10-CM

## 2024-05-18 DIAGNOSIS — R2689 Other abnormalities of gait and mobility: Secondary | ICD-10-CM
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
## 2024-05-18 NOTE — Progress Notes (Signed)
 Internal Medicine Clinic Attending  I was physically present during the key portions of the resident provided service and participated in the medical decision making of patient's management care. I reviewed pertinent patient test results.  The assessment, diagnosis, and plan were formulated together and I agree with the documentation in the resident's note.  Continued bilateral thigh pain with exertion. No pain at rest. Normal strength on exam. She felt symptoms started prior to increase in statin dose during our discussion, CK wnl. Additional evaluation to rule out alternative causes. Loss of appetite is new with worsening weight loss. CTAP ordered. She does have upcoming GI appointment and will need diagnostic mammogram scheduled as previously discussed. Close f/u.  Karna Fellows, MD

## 2024-05-18 NOTE — Addendum Note (Signed)
 Addended by: KARNA FELLOWS on: 05/18/2024 11:05 AM   Modules accepted: Level of Service

## 2024-05-19 ENCOUNTER — Other Ambulatory Visit: Payer: Self-pay

## 2024-05-20 NOTE — Patient Instructions (Signed)
 Visit Information  Thank you for taking time to visit with me today. Please don't hesitate to contact me if I can be of assistance to you before our next scheduled appointment.  Your next care management appointment is by telephone on 12/5 at 3:30 PM  Please call the care guide team at [phone] if you need to cancel, schedule, or reschedule an appointment.   Please call the Suicide and Crisis Lifeline: 988 go to [facility] Urgent [facility] [address], [place] [phone]) call 911 if you are experiencing a Mental Health or Behavioral Health Crisis or need someone to talk to.  Rolin Kerns, LCSW [place]  [facility], [facility] Clinical Social Worker Direct Dial: [phone]  Fax: [phone] Website: [email or web address] 9:49 AM
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
## 2024-05-23 ENCOUNTER — Ambulatory Visit: Payer: Self-pay | Admitting: Internal Medicine

## 2024-05-23 DIAGNOSIS — F322 Major depressive disorder, single episode, severe without psychotic features: Secondary | ICD-10-CM

## 2024-05-23 DIAGNOSIS — M67814 Other specified disorders of tendon, left shoulder: Secondary | ICD-10-CM

## 2024-05-23 NOTE — Telephone Encounter (Signed)
 This RN made first attempt to contact pt with no answer. A voicemail was left with call back number provided.    Copied from CRM [id number]. Topic: Clinical - Medication Question >> May 23, 2024  3:25 PM Mercer PEDLAR wrote: Reason for CRM: Patient would like a callback to discuss recent medication changes to make sure she is taking the right medication and dosage.

## 2024-05-23 NOTE — Telephone Encounter (Signed)
 FYI Only or Action Required?: Action required by provider: clinical question for provider.  Patient was last seen in primary care on 05/16/2024 by Amilibia, Jaden, DO.  Called Nurse Triage reporting No chief complaint on file..  Symptoms began today.   Triage Disposition: No disposition on file.  Patient/caregiver understands and will follow disposition?:     Reason for Disposition  [1] Caller requesting NON-URGENT health information AND [2] PCP's office is the best resource    Routing to PCP office to refill medications and update pharmacy.  Answer Assessment - Initial Assessment Questions 1. REASON FOR CALL: What is the main reason for your call? or How can I best help you?      pt called stating wanting to verify current medication list and is requesting refill on the following attached medications.  Nurse reviewed current medication list with patient.  Pt would like these medications to go to [facility]  Protocols used: Information Only Call - No Triage-A-AH

## 2024-05-24 MED ORDER — SERTRALINE HCL 100 MG PO TABS: 150.0000 mg | ORAL_TABLET | Freq: Every day | ORAL | 11 refills | Status: DC

## 2024-05-24 MED ORDER — DICLOFENAC SODIUM 1 % EX GEL: 4.0000 g | Freq: Four times a day (QID) | CUTANEOUS | 1 refills | Status: DC
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
## 2024-05-24 NOTE — Telephone Encounter (Signed)
 Discussed holding buspirone  at last visit, starting mirtazapine for appetite. Buspirone  removed from medication list.

## 2024-05-25 ENCOUNTER — Other Ambulatory Visit: Payer: Self-pay

## 2024-05-25 ENCOUNTER — Telehealth: Payer: Self-pay

## 2024-05-25 ENCOUNTER — Ambulatory Visit (INDEPENDENT_AMBULATORY_CARE_PROVIDER_SITE_OTHER): Admitting: Gastroenterology

## 2024-05-25 ENCOUNTER — Ambulatory Visit (HOSPITAL_COMMUNITY)
Admission: RE | Admit: 2024-05-25 | Discharge: 2024-05-25 | Disposition: A | Discharge status: Home / Self Care | Source: Ambulatory Visit | Attending: Internal Medicine | Admitting: Internal Medicine

## 2024-05-25 ENCOUNTER — Ambulatory Visit

## 2024-05-25 ENCOUNTER — Encounter: Payer: Self-pay | Admitting: Gastroenterology

## 2024-05-25 VITALS — BP 142/74 | HR 80 | Ht 63.0 in | Wt 186.5 lb

## 2024-05-25 DIAGNOSIS — Z1211 Encounter for screening for malignant neoplasm of colon: Secondary | ICD-10-CM | POA: Diagnosis not present

## 2024-05-25 DIAGNOSIS — M79652 Pain in left thigh: Secondary | ICD-10-CM | POA: Diagnosis not present

## 2024-05-25 DIAGNOSIS — M79651 Pain in right thigh: Secondary | ICD-10-CM | POA: Insufficient documentation

## 2024-05-25 DIAGNOSIS — R630 Anorexia: Secondary | ICD-10-CM

## 2024-05-25 DIAGNOSIS — Z800 Family history of malignant neoplasm of digestive organs: Secondary | ICD-10-CM | POA: Diagnosis not present

## 2024-05-25 DIAGNOSIS — R5383 Other fatigue: Secondary | ICD-10-CM

## 2024-05-25 DIAGNOSIS — R634 Abnormal weight loss: Secondary | ICD-10-CM | POA: Diagnosis not present

## 2024-05-25 LAB — VAS US ABI WITH/WO TBI
Left ABI: 1.05
Right ABI: 1.11

## 2024-05-25 MED ORDER — NA SULFATE-K SULFATE-MG SULF 17.5-3.13-1.6 GM/177ML PO SOLN
1.0000 | Freq: Once | ORAL | 0 refills | Status: AC
  Filled 2024-05-25: qty 354, 2d supply, fill #0
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
## 2024-05-25 NOTE — Patient Instructions (Signed)
 You have been scheduled for an endoscopy and colonoscopy. Please follow the written instructions given to you at your visit today.  If you use inhalers (even only as needed), please bring them with you on the day of your procedure.  DO NOT TAKE 7 DAYS PRIOR TO TEST- Trulicity (dulaglutide) Ozempic, Wegovy (semaglutide) Mounjaro, Zepbound (tirzepatide) Bydureon Bcise (exanatide extended release)  DO NOT TAKE 1 DAY PRIOR TO YOUR TEST Rybelsus (semaglutide) Adlyxin (lixisenatide) Victoza (liraglutide) Byetta (exanatide) ___________________________________________________________________________

## 2024-05-25 NOTE — Progress Notes (Signed)
 ABI's have been completed. Preliminary results can be found in CV Proc through chart review.   05/25/24 2:01 PM Cathlyn Collet RVT

## 2024-05-25 NOTE — Telephone Encounter (Signed)
 LVM regarding missed appt. Reminder of attendance policy and next schedule PT session. - MJ

## 2024-05-26 ENCOUNTER — Other Ambulatory Visit: Payer: Self-pay

## 2024-05-27 ENCOUNTER — Ambulatory Visit

## 2024-05-27 ENCOUNTER — Encounter: Payer: Self-pay | Admitting: Gastroenterology

## 2024-05-27 ENCOUNTER — Telehealth: Payer: Self-pay

## 2024-05-28 NOTE — Progress Notes (Signed)
 ____________________________________________________________  Attending physician addendum:  Thank you for sending this case to me. I have reviewed the entire note and agree with the plan.  Normal albumin in the setting of weight loss somewhat reassuring.  Await CTAP report as well.  EGD/colon scheduled for 12/23  Victory Brand, MD  ____________________________________________________________

## 2024-06-02 ENCOUNTER — Ambulatory Visit (HOSPITAL_COMMUNITY)

## 2024-06-03 ENCOUNTER — Ambulatory Visit (HOSPITAL_COMMUNITY)
Admission: RE | Admit: 2024-06-03 | Discharge: 2024-06-03 | Disposition: A | Discharge status: Home / Self Care | Source: Ambulatory Visit | Attending: Internal Medicine | Admitting: Internal Medicine

## 2024-06-03 DIAGNOSIS — K8020 Calculus of gallbladder without cholecystitis without obstruction: Secondary | ICD-10-CM | POA: Diagnosis not present

## 2024-06-03 DIAGNOSIS — K5710 Diverticulosis of small intestine without perforation or abscess without bleeding: Secondary | ICD-10-CM | POA: Diagnosis not present

## 2024-06-03 DIAGNOSIS — K8689 Other specified diseases of pancreas: Secondary | ICD-10-CM | POA: Insufficient documentation

## 2024-06-03 DIAGNOSIS — R630 Anorexia: Secondary | ICD-10-CM | POA: Insufficient documentation

## 2024-06-03 MED ORDER — IOHEXOL 300 MG/ML  SOLN
100.0000 mL | Freq: Once | INTRAMUSCULAR | Status: AC | PRN
  Administered 2024-06-03: 100 mL via INTRAVENOUS

## 2024-06-03 MED ORDER — SODIUM CHLORIDE (PF) 0.9 % IJ SOLN
INTRAMUSCULAR | Status: AC
  Filled 2024-06-03: qty 50

## 2024-06-08 ENCOUNTER — Ambulatory Visit: Payer: Self-pay

## 2024-06-08 ENCOUNTER — Other Ambulatory Visit: Payer: Self-pay | Admitting: Internal Medicine

## 2024-06-08 DIAGNOSIS — R630 Anorexia: Secondary | ICD-10-CM

## 2024-06-08 DIAGNOSIS — M79652 Pain in left thigh: Secondary | ICD-10-CM

## 2024-06-08 DIAGNOSIS — R5383 Other fatigue: Secondary | ICD-10-CM

## 2024-06-08 DIAGNOSIS — E785 Hyperlipidemia, unspecified: Secondary | ICD-10-CM

## 2024-06-08 DIAGNOSIS — N1831 Chronic kidney disease, stage 3a: Secondary | ICD-10-CM

## 2024-06-08 NOTE — Telephone Encounter (Signed)
 FYI Only or Action Required?: Action required by provider: lab or test result follow-up needed.  Patient was last seen in primary care on 05/16/2024 by Amilibia, Jaden, DO.  Called Nurse Triage reporting Results.  Triage Disposition: Call PCP Now  Patient/caregiver understands and will follow disposition?: Yes     Copied from CRM #[id number]. Topic: Clinical - Lab/Test Results >> Jun 08, 2024  4:44 PM Graeme ORN wrote: Reason for CRM: Stat radiology       Reason for Disposition  Lab or radiology calling with CRITICAL test results  Answer Assessment - Initial Assessment Questions Attempted to contact on call without success. Routing high priority to clinic.      1. REASON FOR CALL or QUESTION: What is your reason for calling today? or How can I best     CT scan results  2. CALLER: Document the source of call. (e.g., laboratory staff, caregiver or patient).     Harlene with [facility] radiology    CT Abdomen Pelvis W Contrast:  IMPRESSION: 1. Small 10 mm enhancing nodule in the pancreatic tail, recommend dedicated pancreatic protocol MRI or CT for further characterization and to exclude a neuroendocrine tumor. 2. Large gallstones without acute cholecystitis. 3. Periampullary duodenal diverticulum without evidence of acute inflammation. 4. These results will be called to the ordering clinician or representative by the radiologist assistant, and communication documented in the pacs or clario dashboard  Protocols used: PCP Call - No Triage-A-AH

## 2024-06-13 NOTE — Telephone Encounter (Signed)
 RTC to patient.  Message was left on Voice Mail that Dr. Karna plans to speak with her on tomorrow at her visit about the results of the Xrays and plans. Left patient reminder of appointment on 06/14/2024 at 8:45 AM.

## 2024-06-13 NOTE — Telephone Encounter (Signed)
 Will discuss imaging results and next steps at appointment tomorrow, 12/2.

## 2024-06-14 ENCOUNTER — Ambulatory Visit: Admitting: Internal Medicine

## 2024-06-14 ENCOUNTER — Encounter: Payer: Self-pay | Admitting: Internal Medicine

## 2024-06-14 VITALS — BP 129/68 | HR 95 | Temp 98.2°F | Ht 64.0 in | Wt 172.6 lb

## 2024-06-14 DIAGNOSIS — R5383 Other fatigue: Secondary | ICD-10-CM | POA: Diagnosis not present

## 2024-06-14 DIAGNOSIS — K8689 Other specified diseases of pancreas: Secondary | ICD-10-CM | POA: Insufficient documentation

## 2024-06-14 DIAGNOSIS — K8020 Calculus of gallbladder without cholecystitis without obstruction: Secondary | ICD-10-CM

## 2024-06-14 DIAGNOSIS — M79652 Pain in left thigh: Secondary | ICD-10-CM

## 2024-06-14 DIAGNOSIS — N6311 Unspecified lump in the right breast, upper outer quadrant: Secondary | ICD-10-CM

## 2024-06-14 DIAGNOSIS — M79651 Pain in right thigh: Secondary | ICD-10-CM

## 2024-06-14 DIAGNOSIS — K869 Disease of pancreas, unspecified: Secondary | ICD-10-CM | POA: Insufficient documentation

## 2024-06-14 DIAGNOSIS — R634 Abnormal weight loss: Secondary | ICD-10-CM

## 2024-06-14 DIAGNOSIS — E876 Hypokalemia: Secondary | ICD-10-CM | POA: Insufficient documentation

## 2024-06-14 DIAGNOSIS — I491 Atrial premature depolarization: Secondary | ICD-10-CM | POA: Diagnosis not present

## 2024-06-14 NOTE — Assessment & Plan Note (Signed)
 Referred again for TTE given exertional fatigue, dyspnea, and history of PACs.

## 2024-06-14 NOTE — Assessment & Plan Note (Signed)
 Provided again the information for scheduling mammogram.

## 2024-06-14 NOTE — Progress Notes (Signed)
 Established Patient Office Visit  Subjective   Patient ID: Sarah [name], female    DOB: 20-May-1946  Age: 78 y.o. MRN: [id number]  Chief Complaint  Patient presents with   Left leg weakness    SOB w/walking.   Results   Fatigue    Weight loss.    [name] returns to clinic today for follow-up of weight loss, exertional fatigue and thigh pain, and recent imaging findings. Please see assessment/plan in problem-based charting for further details of today's visit.    Patient Active Problem List   Diagnosis Date Noted   Mass of pancreas 06/14/2024   Hypokalemia 06/14/2024   Bilateral thigh pain 05/16/2024   Weight loss 05/16/2024   Stage 3a chronic kidney disease (HCC) 01/26/2024   Mass of upper outer quadrant of right breast 10/14/2023   History of prediabetes 07/28/2023   Obesity, morbid (HCC) 03/03/2023   Other skin changes 03/03/2023   PAC (premature atrial contraction) 02/10/2023   Chronic pain of both knees 11/25/2022   Tendinosis of left rotator cuff 09/15/2022   Cholelithiases 05/31/2020   Uterine fibroid 05/31/2020   OSA (obstructive sleep apnea) 06/15/2018   Other fatigue 02/17/2017   Severe recurrent major depression (HCC) 05/07/2016   Vitamin D  deficiency 09/18/2015   Essential hypertension, benign 10/08/2012   Health care maintenance 07/09/2012   Hyperlipidemia 06/16/2011      Objective:     BP 129/68 (BP Location: Left Arm, Patient Position: Sitting, Cuff Size: Normal)   Pulse 95   Temp 98.2 F (36.8 C) (Oral)   Ht 5' 4 (1.626 m)   Wt 172 lb 9.6 oz (78.3 kg)   SpO2 97% Comment: RA  BMI 29.63 kg/m  BP Readings from Last 3 Encounters:  06/14/24 129/68  05/25/24 (!) 142/74  05/16/24 (!) 145/77   Wt Readings from Last 3 Encounters:  06/14/24 172 lb 9.6 oz (78.3 kg)  05/25/24 186 lb 8 oz (84.6 kg)  05/16/24 181 lb 3.2 oz (82.2 kg)      Physical Exam Constitutional:      General: She is not in acute distress.    Appearance: Normal  appearance.  Eyes:     Extraocular Movements: Extraocular movements intact.     Conjunctiva/sclera: Conjunctivae normal.  Cardiovascular:     Rate and Rhythm: Normal rate and regular rhythm.     Heart sounds: Normal heart sounds.  Pulmonary:     Effort: Pulmonary effort is normal.     Breath sounds: Normal breath sounds.  Abdominal:     General: Abdomen is flat. There is no distension.     Palpations: Abdomen is soft. There is no mass.     Tenderness: There is no abdominal tenderness.  Musculoskeletal:        General: No swelling. Normal range of motion.  Neurological:     General: No focal deficit present.     Mental Status: She is alert.     Motor: No weakness.     Deep Tendon Reflexes: Reflexes normal.  Psychiatric:        Mood and Affect: Mood normal.        Behavior: Behavior normal.       Assessment & Plan:   Problem List Items Addressed This Visit       Cardiovascular and Mediastinum   PAC (premature atrial contraction)   Referred again for TTE given exertional fatigue, dyspnea, and history of PACs.      Relevant Orders   ECHOCARDIOGRAM  COMPLETE     Other   Other fatigue   Continues to have generalized fatigue, worse with exertion. Lab evaluation has been unrevealing thus far. Bilateral, exertional thigh pain now resolved. Reporting left leg weakness that she notes is chronic from prior injury, not worsened in recent months or related to any back pain. Ongoing imaging evaluation for significant weight loss. Several changes to mood medications in recent months, currently on sertraline , bupropion , and mirtazapine . Recommending stopping bupropion  today given weight loss SE. Today, she is able to rise from her walker seat and climb onto the exam table without assistance. She has 5/5 strength in bilateral lower extremities although increased effort on left. Patellar reflexes intact. Considered paraneoplastic syndrome such as LEMS but objectively doesn't seem to fit given  intact strength/reflexes. She is experiencing dyspnea on exertion, as well as fatigue. Previously referred for TTE but not completed, will resend today.      Mass of upper outer quadrant of right breast   Provided again the information for scheduling mammogram.      Bilateral thigh pain   [name] notes her bilateral thigh pain with exertion resolved after last visit. May have been secondary to atorvastatin , now on rosuvastatin . ABI performed after last visit not consistent with bilateral aorto-iliac claudication.      Weight loss - Primary   Continued weight loss since last visit. 186 > 172. Continues to have reduced appetite. Denies any dysphagia, early satiety, nausea/vomiting, constipation, diarrhea, abdominal pain. Started mirtazapine  at last visit with minimal change in symptoms. Scheduled for EGD/CSY on 12/23. CT chest/abdomen/pelvis ordered at last visit to evaluate for malignancy. CT chest was cancelled by radiology, unclear reason. I have been unable to reach the radiology department at [facility] to determine why only the abdomen/pelvis was done and original order was cancelled. CT abdomen/pelvis with 10 mm enhancing nodule in the pancreatic tail, further imaging recommended and ordered today. Large gallstones without ductal dilation or cholecystitis. Not symptomatic. CT chest reordered today. We discussed stopping bupropion  today given weight loss effects, close f/u for re-evaluation of mood symptoms.  Plan -CT chest, MRI pancreas protocol -EGD/CSY -Number provided to schedule diagnostic mammo -Continue mirtazapine  nightly, stop bupropion       Relevant Orders   CT Chest W Contrast   Mass of pancreas   10 mm enhancing nodule noted in the pancreatic tail on CT abdomen/pelvis completed for weight loss and fatigue. Further imaging recommended. Discussed with radiology today, recommended MRI abdomen MRCP for pancreas protocol. Reviewed findings with Ms. Rowles and need for further imaging.        Relevant Orders   MR ABDOMEN MRCP W WO CONTAST   Hypokalemia   Hypokalemia noted on prior BMP. Repeat today.      Relevant Orders   Basic metabolic panel with GFR    Return in about 4 weeks (around 07/12/2024) for f/u mood.    Ronnald Sergeant, MD

## 2024-06-14 NOTE — Assessment & Plan Note (Signed)
 Continued weight loss since last visit. 186 > 172. Continues to have reduced appetite. Denies any dysphagia, early satiety, nausea/vomiting, constipation, diarrhea, abdominal pain. Started mirtazapine  at last visit with minimal change in symptoms. Scheduled for EGD/CSY on 12/23. CT chest/abdomen/pelvis ordered at last visit to evaluate for malignancy. CT chest was cancelled by radiology, unclear reason. I have been unable to reach the radiology department at [facility] to determine why only the abdomen/pelvis was done and original order was cancelled. CT abdomen/pelvis with 10 mm enhancing nodule in the pancreatic tail, further imaging recommended and ordered today. Large gallstones without ductal dilation or cholecystitis. Not symptomatic. CT chest reordered today. We discussed stopping bupropion  today given weight loss effects, close f/u for re-evaluation of mood symptoms.  Plan -CT chest, MRI pancreas protocol -EGD/CSY -Number provided to schedule diagnostic mammo -Continue mirtazapine  nightly, stop bupropion   Addendum 12/3 Able to reach [facility] radiology department to discuss cancelled ordered for CT chest. It sounds like they did not scan the chest, therefore cancelled the original order and replaced with CT abdomen/pelvis without notification of ordering team at the time. Scheduled for CT chest tomorrow, 12/4.

## 2024-06-14 NOTE — Assessment & Plan Note (Signed)
 Sarah [name] notes her bilateral thigh pain with exertion resolved after last visit. May have been secondary to atorvastatin , now on rosuvastatin . ABI performed after last visit not consistent with bilateral aorto-iliac claudication.

## 2024-06-14 NOTE — Assessment & Plan Note (Signed)
 10 mm enhancing nodule noted in the pancreatic tail on CT abdomen/pelvis completed for weight loss and fatigue. Further imaging recommended. Discussed with radiology today, recommended MRI abdomen MRCP for pancreas protocol. Reviewed findings with Sarah [name] and need for further imaging.

## 2024-06-14 NOTE — Assessment & Plan Note (Signed)
 Seen again on recent imaging. Asymptomatic at this time.

## 2024-06-14 NOTE — Assessment & Plan Note (Addendum)
 Continues to have generalized fatigue, worse with exertion. Lab evaluation has been unrevealing thus far. Bilateral, exertional thigh pain now resolved. Reporting left leg weakness that she notes is chronic from prior injury, not worsened in recent months or related to any back pain. Ongoing imaging evaluation for significant weight loss. Several changes to mood medications in recent months, currently on sertraline , bupropion , and mirtazapine . Recommending stopping bupropion  today given weight loss SE. Today, she is able to rise from her walker seat and climb onto the exam table without assistance. She has 5/5 strength in bilateral lower extremities although increased effort on left. Patellar reflexes intact. Considered paraneoplastic syndrome such as LEMS but objectively doesn't seem to fit given intact strength/reflexes. She is experiencing dyspnea on exertion, as well as fatigue. Previously referred for TTE but not completed, will resend today.

## 2024-06-14 NOTE — Patient Instructions (Addendum)
 It was good to see you today!  I will order the CT scan of your chest today AND scan of your pancreas to further determine what the mass was seen on CT.  Please call [facility] to schedule your mammogram. Phone [phone]. Make sure to keep your appointment with GI on 12/23 for endoscopy and colonoscopy.  Please STOP taking bupropion  (Wellbutrin ) daily to see if this helps slow weight loss. Continue taking mirtazapine  nightly.  I have ordered an echocardiogram (ultrasound) to evaluate your heart for any cause of shortness of breath or fatigue. They will call you to schedule.

## 2024-06-14 NOTE — Assessment & Plan Note (Signed)
 Hypokalemia noted on prior BMP. Repeat today.

## 2024-06-15 ENCOUNTER — Observation Stay (HOSPITAL_COMMUNITY)
Admission: EM | Admit: 2024-06-15 | Discharge: 2024-06-25 | Disposition: A | Discharge status: Skilled Nursing Facility | Source: Home / Self Care

## 2024-06-15 ENCOUNTER — Other Ambulatory Visit: Payer: Self-pay

## 2024-06-15 ENCOUNTER — Telehealth: Payer: Self-pay | Admitting: Internal Medicine

## 2024-06-15 ENCOUNTER — Ambulatory Visit: Payer: Self-pay | Admitting: Internal Medicine

## 2024-06-15 ENCOUNTER — Encounter (HOSPITAL_COMMUNITY): Payer: Self-pay | Admitting: Internal Medicine

## 2024-06-15 ENCOUNTER — Observation Stay (HOSPITAL_COMMUNITY)

## 2024-06-15 ENCOUNTER — Telehealth: Payer: Self-pay | Admitting: Student

## 2024-06-15 DIAGNOSIS — K869 Disease of pancreas, unspecified: Secondary | ICD-10-CM | POA: Diagnosis present

## 2024-06-15 DIAGNOSIS — E876 Hypokalemia: Principal | ICD-10-CM | POA: Diagnosis present

## 2024-06-15 DIAGNOSIS — R634 Abnormal weight loss: Secondary | ICD-10-CM | POA: Diagnosis present

## 2024-06-15 DIAGNOSIS — E43 Unspecified severe protein-calorie malnutrition: Secondary | ICD-10-CM | POA: Insufficient documentation

## 2024-06-15 DIAGNOSIS — N179 Acute kidney failure, unspecified: Secondary | ICD-10-CM | POA: Diagnosis present

## 2024-06-15 DIAGNOSIS — K8689 Other specified diseases of pancreas: Secondary | ICD-10-CM

## 2024-06-15 DIAGNOSIS — F332 Major depressive disorder, recurrent severe without psychotic features: Secondary | ICD-10-CM | POA: Diagnosis present

## 2024-06-15 DIAGNOSIS — I10 Essential (primary) hypertension: Secondary | ICD-10-CM | POA: Diagnosis present

## 2024-06-15 DIAGNOSIS — R5383 Other fatigue: Secondary | ICD-10-CM

## 2024-06-15 DIAGNOSIS — R54 Age-related physical debility: Secondary | ICD-10-CM | POA: Insufficient documentation

## 2024-06-15 LAB — CBC WITH DIFFERENTIAL/PLATELET
Abs Immature Granulocytes: 0.05 K/uL (ref 0.00–0.07)
Basophils Absolute: 0 K/uL (ref 0.0–0.1)
Basophils Relative: 0 %
Eosinophils Absolute: 0.1 K/uL (ref 0.0–0.5)
Eosinophils Relative: 1 %
HCT: 38.2 % (ref 36.0–46.0)
Hemoglobin: 13 g/dL (ref 12.0–15.0)
Immature Granulocytes: 1 %
Lymphocytes Relative: 13 %
Lymphs Abs: 1.2 K/uL (ref 0.7–4.0)
MCH: 29.1 pg (ref 26.0–34.0)
MCHC: 34 g/dL (ref 30.0–36.0)
MCV: 85.5 fL (ref 80.0–100.0)
Monocytes Absolute: 0.9 K/uL (ref 0.1–1.0)
Monocytes Relative: 9 %
Neutro Abs: 7.1 K/uL (ref 1.7–7.7)
Neutrophils Relative %: 76 %
Platelets: 351 K/uL (ref 150–400)
RBC: 4.47 MIL/uL (ref 3.87–5.11)
RDW: 14.8 % (ref 11.5–15.5)
WBC: 9.4 K/uL (ref 4.0–10.5)
nRBC: 0 % (ref 0.0–0.2)

## 2024-06-15 LAB — COMPREHENSIVE METABOLIC PANEL WITH GFR
ALT: 41 U/L (ref 0–44)
AST: 95 U/L — ABNORMAL HIGH (ref 15–41)
Albumin: 3.5 g/dL (ref 3.5–5.0)
Alkaline Phosphatase: 82 U/L (ref 38–126)
Anion gap: 12 (ref 5–15)
BUN: 9 mg/dL (ref 8–23)
CO2: 39 mmol/L — ABNORMAL HIGH (ref 22–32)
Calcium: 9.4 mg/dL (ref 8.9–10.3)
Chloride: 82 mmol/L — ABNORMAL LOW (ref 98–111)
Creatinine, Ser: 1.43 mg/dL — ABNORMAL HIGH (ref 0.44–1.00)
GFR, Estimated: 38 mL/min — ABNORMAL LOW (ref >60)
Glucose, Bld: 99 mg/dL (ref 70–99)
Potassium: 2.2 mmol/L — CL (ref 3.5–5.1)
Sodium: 133 mmol/L — ABNORMAL LOW (ref 135–145)
Total Bilirubin: 1.1 mg/dL (ref 0.0–1.2)
Total Protein: 7.7 g/dL (ref 6.5–8.1)

## 2024-06-15 LAB — BASIC METABOLIC PANEL WITH GFR
Anion gap: 11 (ref 5–15)
BUN/Creatinine Ratio: 9 — ABNORMAL LOW (ref 12–28)
BUN: 11 mg/dL (ref 8–27)
BUN: 7 mg/dL — ABNORMAL LOW (ref 8–23)
CO2: 31 mmol/L — ABNORMAL HIGH (ref 20–29)
CO2: 34 mmol/L — ABNORMAL HIGH (ref 22–32)
Calcium: 9.9 mg/dL (ref 8.7–10.3)
Calcium: 8.2 mg/dL — ABNORMAL LOW (ref 8.9–10.3)
Chloride: 83 mmol/L — ABNORMAL LOW (ref 96–106)
Chloride: 88 mmol/L — ABNORMAL LOW (ref 98–111)
Creatinine, Ser: 1.2 mg/dL — ABNORMAL HIGH (ref 0.57–1.00)
Creatinine, Ser: 1.29 mg/dL — ABNORMAL HIGH (ref 0.44–1.00)
GFR, Estimated: 43 mL/min — ABNORMAL LOW (ref >60)
Glucose, Bld: 104 mg/dL — ABNORMAL HIGH (ref 70–99)
Glucose: 99 mg/dL (ref 70–99)
Potassium: 2.3 mmol/L — CL (ref 3.5–5.2)
Potassium: 2.3 mmol/L — CL (ref 3.5–5.1)
Sodium: 135 mmol/L (ref 134–144)
Sodium: 133 mmol/L — ABNORMAL LOW (ref 135–145)
eGFR: 47 mL/min/1.73 — ABNORMAL LOW (ref >59)

## 2024-06-15 LAB — MAGNESIUM: Magnesium: 2.4 mg/dL (ref 1.7–2.4)

## 2024-06-15 MED ORDER — LACTATED RINGERS IV BOLUS: 1000.0000 mL | Freq: Once | INTRAVENOUS | Status: DC

## 2024-06-15 MED ORDER — POTASSIUM CHLORIDE CRYS ER 20 MEQ PO TBCR
40.0000 meq | EXTENDED_RELEASE_TABLET | Freq: Once | ORAL | Status: AC
  Administered 2024-06-15: 40 meq via ORAL
  Filled 2024-06-15: qty 2

## 2024-06-15 MED ORDER — ROSUVASTATIN CALCIUM 20 MG PO TABS: 40.0000 mg | ORAL_TABLET | Freq: Every day | ORAL | Status: DC

## 2024-06-15 MED ORDER — POTASSIUM CHLORIDE 10 MEQ/100ML IV SOLN
10.0000 meq | INTRAVENOUS | Status: AC
  Administered 2024-06-15 (×4): 10 meq via INTRAVENOUS
  Filled 2024-06-15 (×5): qty 100

## 2024-06-15 MED ORDER — POTASSIUM CHLORIDE 10 MEQ/100ML IV SOLN
10.0000 meq | INTRAVENOUS | Status: AC
  Administered 2024-06-15 – 2024-06-16 (×4): 10 meq via INTRAVENOUS
  Filled 2024-06-15 (×4): qty 100

## 2024-06-15 MED ORDER — POTASSIUM CHLORIDE CRYS ER 20 MEQ PO TBCR
40.0000 meq | EXTENDED_RELEASE_TABLET | ORAL | Status: AC
  Administered 2024-06-15 (×2): 40 meq via ORAL
  Filled 2024-06-15 (×2): qty 2

## 2024-06-15 MED ORDER — ENOXAPARIN SODIUM 40 MG/0.4ML IJ SOSY
40.0000 mg | PREFILLED_SYRINGE | INTRAMUSCULAR | Status: DC
  Administered 2024-06-15 – 2024-06-25 (×11): 40 mg via SUBCUTANEOUS
  Filled 2024-06-15 (×11): qty 0.4

## 2024-06-15 MED ORDER — IOHEXOL 350 MG/ML SOLN
75.0000 mL | Freq: Once | INTRAVENOUS | Status: AC | PRN
  Administered 2024-06-15: 75 mL via INTRAVENOUS

## 2024-06-15 MED ORDER — POTASSIUM CHLORIDE 10 MEQ/100ML IV SOLN
10.0000 meq | INTRAVENOUS | Status: AC
  Administered 2024-06-15 (×2): 10 meq via INTRAVENOUS
  Filled 2024-06-15: qty 100

## 2024-06-15 MED ORDER — SERTRALINE HCL 25 MG PO TABS
150.0000 mg | ORAL_TABLET | Freq: Every day | ORAL | Status: DC
  Administered 2024-06-15: 150 mg via ORAL
  Filled 2024-06-15: qty 2

## 2024-06-15 NOTE — Progress Notes (Signed)
 Called overnight by on-call team to recommend ED evaluation and repletion of severe hypokalemia. Patient was reached and agreed to present. I have attempted to reach her this morning as well without answer. Voicemail left urging to call the clinic back to discuss labs. Continue to recommend ED presentation for K repletion.

## 2024-06-15 NOTE — ED Notes (Signed)
 CCMD called.

## 2024-06-15 NOTE — Plan of Care (Signed)
  Problem: Education: Goal: Knowledge of General Education information will improve Description: Including pain rating scale, medication(s)/side effects and non-pharmacologic comfort measures Outcome: Progressing   Problem: Health Behavior/Discharge Planning: Goal: Ability to manage health-related needs will improve Outcome: Progressing   Problem: Clinical Measurements: Goal: Ability to maintain clinical measurements within normal limits will improve Outcome: Progressing Goal: Will remain free from infection Outcome: Progressing Goal: Diagnostic test results will improve Outcome: Progressing Goal: Respiratory complications will improve Outcome: Progressing Goal: Cardiovascular complication will be avoided Outcome: Progressing   Problem: Activity: Goal: Risk for activity intolerance will decrease Outcome: Progressing   Problem: Nutrition: Goal: Adequate nutrition will be maintained Outcome: Progressing   Problem: Coping: Goal: Level of anxiety will decrease Outcome: Progressing   Problem: Elimination: Goal: Will not experience complications related to bowel motility Outcome: Progressing Goal: Will not experience complications related to urinary retention Outcome: Progressing   Problem: Pain Managment: Goal: General experience of comfort will improve and/or be controlled Outcome: Progressing   Problem: Safety: Goal: Ability to remain free from injury will improve Outcome: Progressing   Problem: Skin Integrity: Goal: Risk for impaired skin integrity will decrease Outcome: Progressing   Problem: Nutrition Goal: Nutritional status is improving Description: Monitor and assess patient for malnutrition (ex- brittle hair, bruises, dry skin, pale skin and conjunctiva, muscle wasting, smooth red tongue, and disorientation). Collaborate with interdisciplinary team and initiate plan and interventions as ordered.  Monitor patient's weight and dietary intake as ordered or per  policy. Utilize nutrition screening tool and intervene per policy. Determine patient's food preferences and provide high-protein, high-caloric foods as appropriate.  Outcome: Progressing

## 2024-06-15 NOTE — Telephone Encounter (Signed)
 Patient called, on the way to the ED.

## 2024-06-15 NOTE — ED Triage Notes (Signed)
 Pt. Stated, I was sent over here for Potassium low and Im tired alot

## 2024-06-15 NOTE — H&P (Signed)
 Date: 06/15/2024               Patient Name:  Sarah [name] MRN: [id number]  DOB: 1945-08-13 Age / Sex: 78 y.o., female   PCP: Karna Fellows, MD         Medical Service: Internal Medicine Teaching Service         Attending Physician: Dr. Trudy, Mliss Dragon, MD      First Contact: Dr. Armando Rossetti, MD    Second Contact: Dr. Missy Sandhoff, MD         After Hours (After 5p/  First Contact Pager: [phone]  weekends / holidays): Second Contact Pager: [phone]   SUBJECTIVE   Chief Complaint: Hypokalemia   History of Present Illness:   Sarah [name] is a 78 year old female who is presenting after being found to have a potassium level of 2.3. She went to her PCP office on 12/2, for which a BMP was ordered as she does have a history of hypokalemia. She was called promptly as the results returned and then came into the emergency department, where her potassium level was 2.2.   Unfortunately, Sarah [name] seems to have been having this chornic decline since the beginning of this year. Her appaetite is significantly decreased, as her last meal was on Saturday, which was just a couple bites of a Tuna sandwich from [employer]. She had some V8 vegetable drink today. This was discussed at her PCP office on 11/3, for which she was started on Remeron , which she states hasn't helped at all. Due to her decreased intake, she has lost quite a significant amount of weight, on 11/3 her weight was 186lbs, and then on her visit yesterday her weight was charted at 172. She doesn't have any problems swallowing, but states that her taste buds have gone away.   She also states that she has been having worsening dyspnea on exertion, where she can't even walk from her room to her kitchen without feeling very tired. Her energy levels have also not been declining, as most days she has no energy to do things. She does still drive herself around (drove herself to the emergency department when called), but lives alone. She  spends most days sitting around on her couch because she feels exhausted and tired all the time. She used to play bingo with her friends however has not been feeling up to it anymore.   She declines any dysuria, increased urinary frequency, chest pain, generalized body pain.    Meds:  Amlodipine  2.5mg   Hydrochlorothiazide  12.5 mg Mirtazapine  15 mg Melatonin 3 mg nightly Crestor  40 mg Zoloft  150 mg daily  Past Medical History Hypertension Hyperlipidemia Past Surgical History:  Procedure Laterality Date   CERVICAL POLYPECTOMY N/A 10/29/2015   Procedure: CERVICAL POLYPECTOMY;  Surgeon: Charlie Aho, MD;  Location: [facility];  Service: Gynecology;  Laterality: N/A;   CLOSED REDUCTION TIBIAL FRACTURE  04/14/2011   HYSTEROSCOPY WITH D & C N/A 10/29/2015   Procedure: DILATATION AND CURETTAGE /HYSTEROSCOPY with resectoscope;  Surgeon: Charlie Aho, MD;  Location: [facility];  Service: Gynecology;  Laterality: N/A;   TUBAL LIGATION  07/14/1992    Social:  Lives With: Herself Occupation: Retired Support: Family in the area Level of Function: Uses walker for ambulation, independent in all her ADLs/IADLs PCP: Dr. Fellows Karna, MD Substances: Never smoker, no alcohol use, or recreational drugs  Family History:   Family History  Problem Relation Age of Onset   Diabetes Mother  Hypertension Mother    Aneurysm Mother    Colon cancer Father 38   Cancer Father 37       Prostate   Alzheimer's disease Sister    Hypertension Daughter    Cirrhosis Son      Allergies: Allergies as of 06/15/2024   (No Known Allergies)    Review of Systems: A complete ROS was negative except as per HPI.   OBJECTIVE:   Physical Exam: Blood pressure 123/67, pulse 73, temperature 98.3 F (36.8 C), temperature source Oral, resp. rate 19, SpO2 99%.  Constitutional:Tired appearing female, laying in bed, in no acute distress HENT: normocephalic atraumatic, mucous membranes dry Eyes: conjunctiva  non-erythematous Neck: supple Cardiovascular: regular rate and rhythm, no m/r/g Pulmonary/Chest: normal work of breathing on room air, lungs clear to auscultation bilaterally Abdominal: soft, non-tender, non-distended MSK: normal bulk and tone Neurological: alert & oriented x 3,  Skin: warm and dry, decreased skin turgor  Psych: depressed mood and affect  Labs: CBC    Component Value Date/Time   WBC 9.4 06/15/2024 1030   RBC 4.47 06/15/2024 1030   HGB 13.0 06/15/2024 1030   HGB 12.8 04/19/2024 1137   HCT 38.2 06/15/2024 1030   HCT 40.3 04/19/2024 1137   PLT 351 06/15/2024 1030   PLT 273 04/19/2024 1137   MCV 85.5 06/15/2024 1030   MCV 90 04/19/2024 1137   MCH 29.1 06/15/2024 1030   MCHC 34.0 06/15/2024 1030   RDW 14.8 06/15/2024 1030   RDW 14.4 04/19/2024 1137   LYMPHSABS 1.2 06/15/2024 1030   LYMPHSABS 1.4 04/19/2024 1137   MONOABS 0.9 06/15/2024 1030   EOSABS 0.1 06/15/2024 1030   EOSABS 0.1 04/19/2024 1137   BASOSABS 0.0 06/15/2024 1030   BASOSABS 0.0 04/19/2024 1137     CMP     Component Value Date/Time   NA 133 (L) 06/15/2024 1204   NA 135 06/14/2024 0948   K 2.2 (LL) 06/15/2024 1204   CL 82 (L) 06/15/2024 1204   CO2 39 (H) 06/15/2024 1204   GLUCOSE 99 06/15/2024 1204   BUN 9 06/15/2024 1204   BUN 11 06/14/2024 0948   CREATININE 1.43 (H) 06/15/2024 1204   CREATININE 0.93 08/31/2014 1628   CALCIUM  9.4 06/15/2024 1204   PROT 7.7 06/15/2024 1204   PROT 7.2 04/19/2024 1137   ALBUMIN 3.5 06/15/2024 1204   ALBUMIN 4.3 04/19/2024 1137   AST 95 (H) 06/15/2024 1204   ALT 41 06/15/2024 1204   ALKPHOS 82 06/15/2024 1204   BILITOT 1.1 06/15/2024 1204   BILITOT 0.6 04/19/2024 1137   GFRNONAA 38 (L) 06/15/2024 1204   GFRNONAA 66 09/09/2012 1027   GFRAA >60 04/10/2020 0423   GFRAA 76 09/09/2012 1027    Imaging:  No imaging obtained  EKG: personally reviewed my interpretation is sinus rate and rhythm with atrial premature complexes, similar to previous  EKGs.  ASSESSMENT & PLAN:   Assessment & Plan by Problem: Principal Problem:   Hypokalemia   Sarah [name] is a 78 y.o. person living with a history of hypertension, vitamin D  deficiency, hyperlipidemia who presented with abnormal labs and admitted for hypokalemia on hospital day 0  #Hypokalemia #Decreased Appetite Patient had a checkup yesterday in the office, BMP obtained showed potassium level 2.3, recheck in the emergency department was 2.2.  Last BMP obtained before then was on May 16, 2024 which showed a potassium of 3.3, which was supplemented in the outpatient setting.  Etiology could be in the setting of  hydrochlorothiazide  use, this will be held while she is here.  More concerning, is her lack of appetite that seems to have been going on all year.  Could be in the setting of depression, which she is currently taking sertraline  150 mg for her.  She does have decreased interest in things that she normally would enjoy.  Has not gone to any sort of talk therapy either.  Furthermore, she has a decreased energy levels and in the outpatient setting was being worked up for potential malignancy, as she has had a dramatic weight loss especially in the last month.  CT abdomen pelvis ordered for evaluation showed a 10 mm enhancing nodule in the pancreatic tail, and further imaging was recommended.  Will obtain that today.  She was also due for CT chest however this was not done.  She is not having any episodes of night sweats, or intermittent fevers.  Saw GI for her weight loss on May 25, 2024, and recommendation was for an upper endoscopy and a colonoscopy for further evaluation, which is scheduled on December 2023.  Last colonoscopy in 2014 with no polyps and recommendation was for 10-year follow-up.  She is not having any changes in her bowel habits.  B12, TSH, iron studies, CBC all within normal limits in October, so we will hold off on rechecking those today  Plan: - Hold  hydrochlorothiazide  - Replete potassium - Every 4 BMPs, can stop once potassium is greater than 3 - MR abdomen for characterization of enhancing nodule on pancreas seen on last CT scan which could be contributing to her weight loss, decreased appetite, and subsequent hypokalemia - She is scheduled for EGD and colonoscopy on December 23 - Needs diagnostic mammogram in the outpatient setting -Registered dietitian consult -PT/OT  #Dyspnea on Exertion Started around the same time as her appetite decreased.  Has PACs on her EKG, which does not appear new.  In the outpatient setting was recommended to get an echocardiogram, will obtain one while she is here.  Plan:  -Echocardiogram ordered - Cardiac telemetry  #Acute Kidney Injury  Creatinine 1.43 today, baseline appears to be around 1.  Likely in the setting of hypovolemia, will give 1 L bolus.   #Prolonged QTc QTc 576 on EKG today, holding her Remeron  in the setting.   #Hypertension She is currently normotensive, will hold restarting amlodipine  2.5 mg.  Stopping hydrochlorothiazide  altogether.  #Hyperlipidemia Restarting her Crestor    Diet: Heart Healthy VTE: Enoxaparin  IVF: None,None Code: Full  Prior to Admission Living Arrangement: Home, living by herself Anticipated Discharge Location: Home Barriers to Discharge: Medical Managment  Dispo: Admit patient to Observation with expected length of stay less than 2 midnights.  Signed: Deshunda Thackston, MD Internal Medicine Resident PGY-3  06/15/2024, 2:38 PM

## 2024-06-15 NOTE — ED Provider Notes (Signed)
 [place] EMERGENCY DEPARTMENT AT [facility] Provider Note   CSN: [id number] Arrival date & time: 06/15/24  1008     Patient presents with: abnormal labs and Fatigue   Sarah [name] is a 78 y.o. female.   78 year old female presents for evaluation of epigastric pain.  States she was getting ready for breakfast or when she had sudden onset chest pain right in her epigastric region.  She states it resolved now and only comes back if she touches it.  Denies any shortness of breath nausea or vomiting.  States she has had a cold for a few days.  Admits to increased cough.  Denies any other symptoms or concerns at this time.        Prior to Admission medications   Medication Sig Start Date End Date Taking? Authorizing Provider  amLODipine  (NORVASC ) 2.5 MG tablet Take 1 tablet (2.5 mg total) by mouth daily. 01/26/24   Karna Fellows, MD  Blood Pressure Monitoring (BLOOD PRESSURE CUFF) MISC 1 Act by Does not apply route daily. 03/30/24   Charmayne Holmes, DO  diclofenac  Sodium (VOLTAREN  ARTHRITIS PAIN) 1 % GEL Apply 4 g topically 4 (four) times daily. 05/24/24   Karna Fellows, MD  hydrochlorothiazide  (HYDRODIURIL ) 12.5 MG tablet Take 1 tablet (12.5 mg total) by mouth daily. 01/26/24   Karna Fellows, MD  melatonin 3 MG TABS tablet Take one tablet 1-2 hours before bedtime. 01/26/24   Karna Fellows, MD  mirtazapine  (REMERON ) 15 MG tablet Take 1 tablet (15 mg total) by mouth at bedtime. 05/16/24 05/16/25  Amilibia, Jaden, DO  rosuvastatin  (CRESTOR ) 40 MG tablet Take 1 tablet (40 mg total) by mouth daily. 05/16/24 05/16/25  Amilibia, Jaden, DO  sertraline  (ZOLOFT ) 100 MG tablet Take 1.5 tablets (150 mg total) by mouth daily. 01/26/24   Karna Fellows, MD  sertraline  (ZOLOFT ) 100 MG tablet Take 1.5 tablets (150 mg total) by mouth daily. 05/24/24   Karna Fellows, MD  Vitamin D , Cholecalciferol , 25 MCG (1000 UT) CAPS Take 1 capsule by mouth daily. 03/30/24   Charmayne Holmes, DO    Allergies: Patient has no known  allergies.    Review of Systems  Constitutional:  Negative for chills and fever.  HENT:  Negative for ear pain and sore throat.   Eyes:  Negative for pain and visual disturbance.  Respiratory:  Negative for cough and shortness of breath.   Cardiovascular:  Positive for chest pain. Negative for palpitations.  Gastrointestinal:  Negative for abdominal pain and vomiting.  Genitourinary:  Negative for dysuria and hematuria.  Musculoskeletal:  Negative for arthralgias and back pain.  Skin:  Negative for color change and rash.  Neurological:  Negative for seizures and syncope.  All other systems reviewed and are negative.   Updated Vital Signs BP 123/67   Pulse 73   Temp 98.3 F (36.8 C) (Oral)   Resp 19   SpO2 99%   Physical Exam Vitals and nursing note reviewed.  Constitutional:      General: She is not in acute distress.    Appearance: Normal appearance. She is well-developed. She is not ill-appearing.  HENT:     Head: Normocephalic and atraumatic.  Eyes:     Conjunctiva/sclera: Conjunctivae normal.  Cardiovascular:     Rate and Rhythm: Normal rate and regular rhythm.     Heart sounds: No murmur heard. Pulmonary:     Effort: Pulmonary effort is normal. No respiratory distress.     Breath sounds: Normal breath sounds.  Chest:     Chest wall: No tenderness.  Abdominal:     Palpations: Abdomen is soft.     Tenderness: There is abdominal tenderness.     Comments: Mild epigastric tenderness to palpation  Musculoskeletal:        General: No swelling.     Cervical back: Neck supple.  Skin:    General: Skin is warm and dry.     Capillary Refill: Capillary refill takes less than 2 seconds.  Neurological:     Mental Status: She is alert.  Psychiatric:        Mood and Affect: Mood normal.     (all labs ordered are listed, but only abnormal results are displayed) Labs Reviewed  COMPREHENSIVE METABOLIC PANEL WITH GFR - Abnormal; Notable for the following components:       Result Value   Sodium 133 (*)    Potassium 2.2 (*)    Chloride 82 (*)    CO2 39 (*)    Creatinine, Ser 1.43 (*)    AST 95 (*)    GFR, Estimated 38 (*)    All other components within normal limits  CBC WITH DIFFERENTIAL/PLATELET  MAGNESIUM  URINALYSIS, ROUTINE W REFLEX MICROSCOPIC  I-STAT CHEM 8, ED    EKG: EKG Interpretation Date/Time:  Wednesday June 15 2024 11:50:54 EST Ventricular Rate:  75 PR Interval:  156 QRS Duration:  91 QT Interval:  515 QTC Calculation: 576 R Axis:   13  Text Interpretation: Sinus rhythm Atrial premature complex Probable left atrial enlargement Nonspecific ST changes Prolonged QT interval Compared with prior EKG from 02/10/2023 Confirmed by Gennaro Bouchard ([id number]) on 06/15/2024 12:38:39 PM  Radiology: No results found.   .Critical Care  Performed by: Gennaro Bouchard CROME, DO Authorized by: Gennaro Bouchard CROME, DO   Critical care provider statement:    Critical care time (minutes):  30   Critical care time was exclusive of:  Separately billable procedures and treating other patients and teaching time   Critical care was necessary to treat or prevent imminent or life-threatening deterioration of the following conditions:  Endocrine crisis   Critical care was time spent personally by me on the following activities:  Development of treatment plan with patient or surrogate, discussions with consultants, evaluation of patient's response to treatment, examination of patient, ordering and review of laboratory studies, ordering and review of radiographic studies, ordering and performing treatments and interventions, pulse oximetry, re-evaluation of patient's condition and review of old charts   Care discussed with: admitting provider      Medications Ordered in the ED  potassium chloride  10 mEq in 100 mL IVPB (10 mEq Intravenous New Bag/Given 06/15/24 1438)  enoxaparin  (LOVENOX ) injection 40 mg (has no administration in time range)  lactated ringers  bolus  1,000 mL (has no administration in time range)  rosuvastatin  (CRESTOR ) tablet 40 mg (has no administration in time range)  sertraline  (ZOLOFT ) tablet 150 mg (has no administration in time range)  potassium chloride  SA (KLOR-CON  M) CR tablet 40 mEq (40 mEq Oral Given 06/15/24 1234)                                    Medical Decision Making Cardiac monitor interpretation: Sinus rhythm, no ectopy  Patient was sent in for hypokalemia.  She is profoundly hypokalemic and symptomatic from this.  Vitals are stable but she does have significant EKG changes of QT prolongation U  waves.  Gave her p.o. potassium as well as IV potassium.  Discussed patient's case with hospitalist and patient admitted for further workup and management.  Patient and family at bedside are agreeable with the plan.  Problems Addressed: Hypokalemia: acute illness or injury that poses a threat to life or bodily functions Other fatigue: acute illness or injury  Amount and/or Complexity of Data Reviewed External Data Reviewed: notes.    Details: Patient labs from Monday reviewed and patient had hypokalemia of 2.3 that day Labs: ordered. Decision-making details documented in ED Course.    Details: Ordered and reviewed by me and patient is profoundly hypokalemic, however magnesium and other lab workup fairly unremarkable ECG/medicine tests: ordered and independent interpretation performed. Decision-making details documented in ED Course.    Details: Ordered and interpreted by me in the absence of cardiology and shows new prolonged QTc as well as some U waves Discussion of management or test interpretation with external provider(s): Dr. Trudy -hospitalist that I spoke with her on the phone regarding the patient and patient will be admitted for further workup and management  Risk OTC drugs. Prescription drug management. Drug therapy requiring intensive monitoring for toxicity. Decision regarding hospitalization. Risk Details:  CRITICAL CARE Performed by: Duwaine LITTIE Fusi   Total critical care time: 30 minutes  Critical care time was exclusive of separately billable procedures and treating other patients.  Critical care was necessary to treat or prevent imminent or life-threatening deterioration.  Critical care was time spent personally by me on the following activities: development of treatment plan with patient and/or surrogate as well as nursing, discussions with consultants, evaluation of patient's response to treatment, examination of patient, obtaining history from patient or surrogate, ordering and performing treatments and interventions, ordering and review of laboratory studies, ordering and review of radiographic studies, pulse oximetry and re-evaluation of patient's condition.   Critical Care Total time providing critical care: 30 minutes     Final diagnoses:  Hypokalemia  Other fatigue    ED Discharge Orders     None          Fusi Duwaine LITTIE, DO 06/15/24 1521

## 2024-06-15 NOTE — Telephone Encounter (Signed)
 Paged by [facility] for critical lab from [facility] drawn 06/14/2024.  Potassium level 2.3.  Attempt to call the patient twice and reached voicemail twice.  Left a HIPAA [place] message with instructions to call the clinic back with negative message.  I will also attempt to call the patient again within an hour.  Recommendation will be to present to emergency department for potassium repletion under close monitoring.  Fairy Pool, DO Internal Medicine Resident, PGY-3 Please contact the on call pager at [phone] for any urgent or emergent needs. 4:34 AM 06/15/2024

## 2024-06-15 NOTE — Telephone Encounter (Signed)
 Error

## 2024-06-15 NOTE — Progress Notes (Signed)
 This RN called pharmacist to verify that it was ok to give Potassium IV bags #7-10

## 2024-06-16 ENCOUNTER — Ambulatory Visit (HOSPITAL_COMMUNITY)

## 2024-06-16 ENCOUNTER — Observation Stay (HOSPITAL_COMMUNITY)

## 2024-06-16 ENCOUNTER — Ambulatory Visit (HOSPITAL_COMMUNITY): Admission: RE | Admit: 2024-06-16

## 2024-06-16 DIAGNOSIS — R634 Abnormal weight loss: Secondary | ICD-10-CM

## 2024-06-16 DIAGNOSIS — R54 Age-related physical debility: Secondary | ICD-10-CM | POA: Insufficient documentation

## 2024-06-16 DIAGNOSIS — K8689 Other specified diseases of pancreas: Secondary | ICD-10-CM

## 2024-06-16 DIAGNOSIS — E43 Unspecified severe protein-calorie malnutrition: Secondary | ICD-10-CM

## 2024-06-16 DIAGNOSIS — N179 Acute kidney failure, unspecified: Secondary | ICD-10-CM

## 2024-06-16 DIAGNOSIS — I10 Essential (primary) hypertension: Secondary | ICD-10-CM

## 2024-06-16 DIAGNOSIS — E876 Hypokalemia: Secondary | ICD-10-CM

## 2024-06-16 DIAGNOSIS — R0609 Other forms of dyspnea: Secondary | ICD-10-CM

## 2024-06-16 DIAGNOSIS — I4581 Long QT syndrome: Secondary | ICD-10-CM

## 2024-06-16 DIAGNOSIS — E785 Hyperlipidemia, unspecified: Secondary | ICD-10-CM

## 2024-06-16 LAB — RENAL FUNCTION PANEL
Albumin: 2.9 g/dL — ABNORMAL LOW (ref 3.5–5.0)
Albumin: 2.9 g/dL — ABNORMAL LOW (ref 3.5–5.0)
Anion gap: 9 (ref 5–15)
Anion gap: 11 (ref 5–15)
BUN: 7 mg/dL — ABNORMAL LOW (ref 8–23)
BUN: 6 mg/dL — ABNORMAL LOW (ref 8–23)
CO2: 33 mmol/L — ABNORMAL HIGH (ref 22–32)
CO2: 32 mmol/L (ref 22–32)
Calcium: 8.6 mg/dL — ABNORMAL LOW (ref 8.9–10.3)
Calcium: 8.6 mg/dL — ABNORMAL LOW (ref 8.9–10.3)
Chloride: 96 mmol/L — ABNORMAL LOW (ref 98–111)
Chloride: 98 mmol/L (ref 98–111)
Creatinine, Ser: 1.23 mg/dL — ABNORMAL HIGH (ref 0.44–1.00)
Creatinine, Ser: 1.16 mg/dL — ABNORMAL HIGH (ref 0.44–1.00)
GFR, Estimated: 45 mL/min — ABNORMAL LOW (ref >60)
GFR, Estimated: 49 mL/min — ABNORMAL LOW (ref >60)
Glucose, Bld: 82 mg/dL (ref 70–99)
Glucose, Bld: 117 mg/dL — ABNORMAL HIGH (ref 70–99)
Phosphorus: 1 mg/dL — CL (ref 2.5–4.6)
Phosphorus: 3.5 mg/dL (ref 2.5–4.6)
Potassium: 3 mmol/L — ABNORMAL LOW (ref 3.5–5.1)
Potassium: 3.2 mmol/L — ABNORMAL LOW (ref 3.5–5.1)
Sodium: 138 mmol/L (ref 135–145)
Sodium: 141 mmol/L (ref 135–145)

## 2024-06-16 LAB — COMPREHENSIVE METABOLIC PANEL WITH GFR
ALT: 29 U/L (ref 0–44)
AST: 62 U/L — ABNORMAL HIGH (ref 15–41)
Albumin: 2.7 g/dL — ABNORMAL LOW (ref 3.5–5.0)
Alkaline Phosphatase: 67 U/L (ref 38–126)
Anion gap: 12 (ref 5–15)
BUN: 7 mg/dL — ABNORMAL LOW (ref 8–23)
CO2: 30 mmol/L (ref 22–32)
Calcium: 8 mg/dL — ABNORMAL LOW (ref 8.9–10.3)
Chloride: 95 mmol/L — ABNORMAL LOW (ref 98–111)
Creatinine, Ser: 1.24 mg/dL — ABNORMAL HIGH (ref 0.44–1.00)
GFR, Estimated: 45 mL/min — ABNORMAL LOW (ref >60)
Glucose, Bld: 76 mg/dL (ref 70–99)
Potassium: 2.9 mmol/L — ABNORMAL LOW (ref 3.5–5.1)
Sodium: 137 mmol/L (ref 135–145)
Total Bilirubin: 0.9 mg/dL (ref 0.0–1.2)
Total Protein: 5.8 g/dL — ABNORMAL LOW (ref 6.5–8.1)

## 2024-06-16 LAB — ECHOCARDIOGRAM COMPLETE
Area-P 1/2: 2.07 cm2
Height: 64 in
S' Lateral: 2.9 cm
Weight: 2751.34 [oz_av]

## 2024-06-16 LAB — BASIC METABOLIC PANEL WITH GFR
Anion gap: 7 (ref 5–15)
BUN: 7 mg/dL — ABNORMAL LOW (ref 8–23)
CO2: 34 mmol/L — ABNORMAL HIGH (ref 22–32)
Calcium: 8 mg/dL — ABNORMAL LOW (ref 8.9–10.3)
Chloride: 92 mmol/L — ABNORMAL LOW (ref 98–111)
Creatinine, Ser: 1.19 mg/dL — ABNORMAL HIGH (ref 0.44–1.00)
GFR, Estimated: 47 mL/min — ABNORMAL LOW (ref >60)
Glucose, Bld: 112 mg/dL — ABNORMAL HIGH (ref 70–99)
Potassium: 3 mmol/L — ABNORMAL LOW (ref 3.5–5.1)
Sodium: 133 mmol/L — ABNORMAL LOW (ref 135–145)

## 2024-06-16 LAB — CBC
HCT: 31.9 % — ABNORMAL LOW (ref 36.0–46.0)
Hemoglobin: 10.9 g/dL — ABNORMAL LOW (ref 12.0–15.0)
MCH: 29.4 pg (ref 26.0–34.0)
MCHC: 34.2 g/dL (ref 30.0–36.0)
MCV: 86 fL (ref 80.0–100.0)
Platelets: 275 K/uL (ref 150–400)
RBC: 3.71 MIL/uL — ABNORMAL LOW (ref 3.87–5.11)
RDW: 15.4 % (ref 11.5–15.5)
WBC: 7.9 K/uL (ref 4.0–10.5)
nRBC: 0 % (ref 0.0–0.2)

## 2024-06-16 LAB — MAGNESIUM: Magnesium: 2.2 mg/dL (ref 1.7–2.4)

## 2024-06-16 MED ORDER — NA SULFATE-K SULFATE-MG SULF 17.5-3.13-1.6 GM/177ML PO SOLN
0.5000 | Freq: Once | ORAL | Status: AC
  Administered 2024-06-17: 177 mL via ORAL

## 2024-06-16 MED ORDER — POTASSIUM CHLORIDE CRYS ER 20 MEQ PO TBCR: 40.0000 meq | EXTENDED_RELEASE_TABLET | Freq: Two times a day (BID) | ORAL | Status: DC

## 2024-06-16 MED ORDER — ENSURE PLUS HIGH PROTEIN PO LIQD
237.0000 mL | Freq: Two times a day (BID) | ORAL | Status: DC
  Administered 2024-06-16 – 2024-06-25 (×15): 237 mL via ORAL

## 2024-06-16 MED ORDER — SODIUM CHLORIDE 0.9 % IV SOLN: INTRAVENOUS | Status: AC

## 2024-06-16 MED ORDER — NA SULFATE-K SULFATE-MG SULF 17.5-3.13-1.6 GM/177ML PO SOLN
0.5000 | Freq: Once | ORAL | Status: AC
  Administered 2024-06-16: 177 mL via ORAL
  Filled 2024-06-16: qty 1

## 2024-06-16 MED ORDER — POTASSIUM CHLORIDE CRYS ER 20 MEQ PO TBCR
40.0000 meq | EXTENDED_RELEASE_TABLET | Freq: Two times a day (BID) | ORAL | Status: DC
  Administered 2024-06-16: 40 meq via ORAL
  Filled 2024-06-16: qty 2

## 2024-06-16 MED ORDER — ADULT MULTIVITAMIN W/MINERALS CH
1.0000 | ORAL_TABLET | Freq: Every day | ORAL | Status: DC
  Administered 2024-06-16 – 2024-06-25 (×10): 1 via ORAL
  Filled 2024-06-16 (×10): qty 1

## 2024-06-16 MED ORDER — GADOBUTROL 1 MMOL/ML IV SOLN
7.5000 mL | Freq: Once | INTRAVENOUS | Status: AC | PRN
  Administered 2024-06-16: 7.5 mL via INTRAVENOUS

## 2024-06-16 MED ORDER — POTASSIUM PHOSPHATES 15 MMOLE/5ML IV SOLN
45.0000 mmol | Freq: Once | INTRAVENOUS | Status: AC
  Administered 2024-06-16: 45 mmol via INTRAVENOUS
  Filled 2024-06-16: qty 15

## 2024-06-16 MED ORDER — THIAMINE MONONITRATE 100 MG PO TABS
100.0000 mg | ORAL_TABLET | Freq: Every day | ORAL | Status: AC
  Administered 2024-06-16 – 2024-06-22 (×7): 100 mg via ORAL
  Filled 2024-06-16 (×7): qty 1

## 2024-06-16 MED ORDER — POTASSIUM CHLORIDE CRYS ER 20 MEQ PO TBCR
40.0000 meq | EXTENDED_RELEASE_TABLET | Freq: Once | ORAL | Status: AC
  Administered 2024-06-17: 40 meq via ORAL
  Filled 2024-06-16: qty 2

## 2024-06-16 NOTE — Plan of Care (Signed)
  Problem: Education: Goal: Knowledge of General Education information will improve Description: Including pain rating scale, medication(s)/side effects and non-pharmacologic comfort measures 06/16/2024 0150 by Alonza Edsel CROME, RN Outcome: Progressing 06/15/2024 2255 by Alonza Edsel CROME, RN Outcome: Progressing   Problem: Health Behavior/Discharge Planning: Goal: Ability to manage health-related needs will improve 06/16/2024 0150 by Alonza Edsel CROME, RN Outcome: Progressing 06/15/2024 2255 by Alonza Edsel CROME, RN Outcome: Progressing   Problem: Clinical Measurements: Goal: Ability to maintain clinical measurements within normal limits will improve 06/16/2024 0150 by Alonza Edsel CROME, RN Outcome: Progressing 06/15/2024 2255 by Alonza Edsel CROME, RN Outcome: Progressing Goal: Will remain free from infection 06/16/2024 0150 by Alonza Edsel CROME, RN Outcome: Progressing 06/15/2024 2255 by Alonza Edsel CROME, RN Outcome: Progressing Goal: Diagnostic test results will improve 06/16/2024 0150 by Alonza Edsel CROME, RN Outcome: Progressing 06/15/2024 2255 by Alonza Edsel CROME, RN Outcome: Progressing Goal: Respiratory complications will improve 06/16/2024 0150 by Alonza Edsel CROME, RN Outcome: Progressing 06/15/2024 2255 by Alonza Edsel CROME, RN Outcome: Progressing Goal: Cardiovascular complication will be avoided 06/16/2024 0150 by Alonza Edsel CROME, RN Outcome: Progressing 06/15/2024 2255 by Alonza Edsel CROME, RN Outcome: Progressing   Problem: Activity: Goal: Risk for activity intolerance will decrease 06/16/2024 0150 by Alonza Edsel CROME, RN Outcome: Progressing 06/15/2024 2255 by Alonza Edsel CROME, RN Outcome: Progressing   Problem: Nutrition: Goal: Adequate nutrition will be maintained 06/16/2024 0150 by Alonza Edsel CROME, RN Outcome: Progressing 06/15/2024 2255 by Alonza Edsel CROME, RN Outcome: Progressing   Problem: Coping: Goal: Level  of anxiety will decrease 06/16/2024 0150 by Alonza Edsel CROME, RN Outcome: Progressing 06/15/2024 2255 by Alonza Edsel CROME, RN Outcome: Progressing   Problem: Elimination: Goal: Will not experience complications related to bowel motility 06/16/2024 0150 by Alonza Edsel CROME, RN Outcome: Progressing 06/15/2024 2255 by Alonza Edsel CROME, RN Outcome: Progressing Goal: Will not experience complications related to urinary retention 06/16/2024 0150 by Alonza Edsel CROME, RN Outcome: Progressing 06/15/2024 2255 by Alonza Edsel CROME, RN Outcome: Progressing   Problem: Pain Managment: Goal: General experience of comfort will improve and/or be controlled 06/16/2024 0150 by Alonza Edsel CROME, RN Outcome: Progressing 06/15/2024 2255 by Alonza Edsel CROME, RN Outcome: Progressing   Problem: Safety: Goal: Ability to remain free from injury will improve 06/16/2024 0150 by Alonza Edsel CROME, RN Outcome: Progressing 06/15/2024 2255 by Alonza Edsel CROME, RN Outcome: Progressing   Problem: Skin Integrity: Goal: Risk for impaired skin integrity will decrease 06/16/2024 0150 by Alonza Edsel CROME, RN Outcome: Progressing 06/15/2024 2255 by Alonza Edsel CROME, RN Outcome: Progressing   Problem: Nutrition Goal: Nutritional status is improving Description: Monitor and assess patient for malnutrition (ex- brittle hair, bruises, dry skin, pale skin and conjunctiva, muscle wasting, smooth red tongue, and disorientation). Collaborate with interdisciplinary team and initiate plan and interventions as ordered.  Monitor patient's weight and dietary intake as ordered or per policy. Utilize nutrition screening tool and intervene per policy. Determine patient's food preferences and provide high-protein, high-caloric foods as appropriate.  06/16/2024 0150 by Alonza Edsel CROME, RN Outcome: Progressing 06/15/2024 2255 by Alonza Edsel CROME, RN Outcome: Progressing

## 2024-06-16 NOTE — Progress Notes (Addendum)
 HD#1 SUBJECTIVE:  Patient Summary: Sarah [name] is a 78 y.o. with a pertinent PMH of hypertension, hyperlipidemia, chronic poor oral intake, and progressive unintentional weight loss, who presented with severe hypokalemia (K 2.2) and was admitted for electrolyte derangements and evaluation of weight loss with concern for underlying malignancy.  Overnight Events: No acute events. Received scheduled potassium repletion. Repeat K improved from 2.3 ? 2.9. No arrhythmias on telemetry.  Interim History: Patient feels slightly better today but remains fatigued. No chest pain, palpitations, nausea, vomiting, or diarrhea. Appetite still severely reduced. No dyspnea at rest. mild dizziness but denied muscle cramps. Denies abdominal pain.   OBJECTIVE:  Vital Signs: Vitals:   06/16/24 0333 06/16/24 0431 06/16/24 1220 06/16/24 1625  BP:  (!) 108/53 116/74 103/60  Pulse:  76 77 81  Resp:  18 18 18   Temp:  98.3 F (36.8 C) 98 F (36.7 C) 98.7 F (37.1 C)  TempSrc:  Oral    SpO2:  98% 98% 100%  Weight: 78 kg     Height: 5' 4 (1.626 m)      Supplemental O2: Room Air SpO2: 100 %  Filed Weights   06/16/24 0333  Weight: 78 kg     Intake/Output Summary (Last 24 hours) at 06/16/2024 1823 Last data filed at 06/16/2024 1755 Gross per 24 hour  Intake 1054.74 ml  Output --  Net 1054.74 ml   Net IO Since Admission: 1,316.44 mL [06/16/24 1823]  Physical Exam: Physical Exam General: Tired-appearing female, no acute distress. HEENT: Dry mucous membranes. CV: RRR, no murmurs. Resp: CTAB, normal work of breathing. Abd: Soft, non-tender, non-distended. Ext: No edema. Skin: Warm, dry, decreased turgor. Neuro: Alert and oriented 3.physical muscle strength: distal lower ext and upper ext: 5/5 Psych: Depressed affect.  Patient Lines/Drains/Airways Status     Active Line/Drains/Airways     Name Placement date Placement time Site Days   Peripheral IV 06/15/24 20 G 1 Left Antecubital  06/15/24  1226  Antecubital  1            Pertinent labs and imaging:      Latest Ref Rng & Units 06/16/2024    4:39 AM 06/15/2024   10:30 AM 04/19/2024   11:37 AM  CBC  WBC 4.0 - 10.5 K/uL 7.9  9.4  5.4   Hemoglobin 12.0 - 15.0 g/dL 89.0  86.9  87.1   Hematocrit 36.0 - 46.0 % 31.9  38.2  40.3   Platelets 150 - 400 K/uL 275  351  273        Latest Ref Rng & Units 06/16/2024    3:14 PM 06/16/2024    4:39 AM 06/15/2024   11:33 PM  CMP  Glucose 70 - 99 mg/dL 82  76  887   BUN 8 - 23 mg/dL 7  7  7    Creatinine 0.44 - 1.00 mg/dL 8.76  8.75  8.80   Sodium 135 - 145 mmol/L 138  137  133   Potassium 3.5 - 5.1 mmol/L 3.0  2.9  3.0   Chloride 98 - 111 mmol/L 96  95  92   CO2 22 - 32 mmol/L 33  30  34   Calcium  8.9 - 10.3 mg/dL 8.6  8.0  8.0   Total Protein 6.5 - 8.1 g/dL  5.8    Total Bilirubin 0.0 - 1.2 mg/dL  0.9    Alkaline Phos 38 - 126 U/L  67    AST 15 - 41 U/L  62    ALT 0 - 44 U/L  29      ECHOCARDIOGRAM COMPLETE Result Date: 06/16/2024    ECHOCARDIOGRAM REPORT   Patient Name:   NATALE BARBA Nies Date of Exam: 06/16/2024 Medical Rec #:  [id number]      Height:       64.0 in Accession #:    [id number]     Weight:       172.0 lb Date of Birth:  07/16/1945      BSA:          1.835 m Patient Age:    78 years       BP:           108/53 mmHg Patient Gender: F              HR:           76 bpm. Exam Location:  Inpatient Procedure: 2D Echo, Cardiac Doppler and Color Doppler (Both Spectral and Color            Flow Doppler were utilized during procedure). Indications:    Dyspnea R06.00  History:        Patient has no prior history of Echocardiogram examinations.                 CKD; Risk Factors:Hypertension.  Sonographer:    Merlynn Argyle Referring Phys: [id number] MEGAN L [facility]  Sonographer Comments: Image acquisition challenging due to respiratory motion. IMPRESSIONS  1. Left ventricular ejection fraction, by estimation, is 60 to 65%. The left ventricle has normal function. The left ventricle  has no regional wall motion abnormalities. Left ventricular diastolic parameters were normal.  2. Right ventricular systolic function is normal. The right ventricular size is normal.  3. The mitral valve is normal in structure. No evidence of mitral valve regurgitation. No evidence of mitral stenosis.  4. The aortic valve was not well visualized. Aortic valve regurgitation is not visualized. Aortic valve sclerosis/calcification is present, without any evidence of aortic stenosis. Comparison(s): No prior Echocardiogram. FINDINGS  Left Ventricle: Left ventricular ejection fraction, by estimation, is 60 to 65%. The left ventricle has normal function. The left ventricle has no regional wall motion abnormalities. The left ventricular internal cavity size was normal in size. There is  no left ventricular hypertrophy. Left ventricular diastolic parameters were normal. Right Ventricle: The right ventricular size is normal. No increase in right ventricular wall thickness. Right ventricular systolic function is normal. Left Atrium: Left atrial size was normal in size. Right Atrium: Right atrial size was normal in size. Pericardium: There is no evidence of pericardial effusion. Mitral Valve: The mitral valve is normal in structure. No evidence of mitral valve regurgitation. No evidence of mitral valve stenosis. Tricuspid Valve: The tricuspid valve is normal in structure. Tricuspid valve regurgitation is mild . No evidence of tricuspid stenosis. Aortic Valve: The aortic valve was not well visualized. Aortic valve regurgitation is not visualized. Aortic valve sclerosis/calcification is present, without any evidence of aortic stenosis. Pulmonic Valve: The pulmonic valve was not well visualized. Pulmonic valve regurgitation is not visualized. No evidence of pulmonic stenosis. Aorta: The aortic root is normal in size and structure. Venous: The inferior vena cava was not well visualized. IAS/Shunts: No atrial level shunt detected by  color flow Doppler.  LEFT VENTRICLE PLAX 2D LVIDd:         4.10 cm   Diastology LVIDs:         2.90 cm   LV  e' medial:    9.79 cm/s LV PW:         1.00 cm   LV E/e' medial:  7.4 LV IVS:        0.80 cm   LV e' lateral:   9.48 cm/s LVOT diam:     1.90 cm   LV E/e' lateral: 7.6 LV SV:         72 LV SV Index:   39 LVOT Area:     2.84 cm LV IVRT:       113 msec  RIGHT VENTRICLE             IVC RV Basal diam:  2.50 cm     IVC diam: 2.20 cm RV S prime:     11.30 cm/s TAPSE (M-mode): 2.4 cm LEFT ATRIUM             Index        RIGHT ATRIUM          Index LA diam:        3.10 cm 1.69 cm/m   RA Area:     9.25 cm LA Vol (A2C):   39.3 ml 21.42 ml/m  RA Volume:   17.90 ml 9.76 ml/m LA Vol (A4C):   29.8 ml 16.24 ml/m LA Biplane Vol: 34.3 ml 18.69 ml/m  AORTIC VALVE LVOT Vmax:   104.00 cm/s LVOT Vmean:  70.300 cm/s LVOT VTI:    0.254 m  AORTA Ao Root diam: 3.10 cm Ao Asc diam:  3.00 cm MITRAL VALVE                TRICUSPID VALVE MV Area (PHT): 2.07 cm     TR Peak grad:   24.4 mmHg MV Decel Time: 366 msec     TR Vmax:        247.00 cm/s MV E velocity: 72.00 cm/s MV A velocity: 116.00 cm/s  SHUNTS MV E/A ratio:  0.62         Systemic VTI:  0.25 m                             Systemic Diam: 1.90 cm Joelle Azobou Tonleu Electronically signed by Joelle Cedars Tonleu Signature Date/Time: 06/16/2024/9:22:24 AM    Final    MR ABDOMEN MRCP W WO CONTAST Result Date: 06/16/2024 EXAM: MRCP WITH AND WITHOUT IV CONTRAST 06/16/2024 07:38:13 AM TECHNIQUE: Multisequence, multiplanar magnetic resonance images of the abdomen with and without intravenous contrast. MRCP sequences were performed. 7.5 mL gadobutrol (GADAVIST) 1 MMOL/ML injection was administered intravenously. COMPARISON: 06/03/2024 CLINICAL HISTORY: Pancreatic tail mass, epigastric pain. FINDINGS: LIVER: Unremarkable. GALLBLADDER AND BILIARY SYSTEM: At least 4 gallstones are present in the gallbladder. No intrahepatic or extrahepatic ductal dilation. SPLEEN: Unremarkable.  PANCREAS/PANCREATIC DUCT: 1.1 cm in diameter T2 hyperintense lesion of the tip of the pancreatic tail on image 18 series 5 corresponding to the CT finding, demonstrating initial T1 signal hypointensity on image 45 of series 13 followed by late arterial phase hyperenhancement on image 4217, and generally mildly hyperenhancing to isoenhancing on later phase images. Given the arterial phase enhancement characteristics, neuroendocrine tumor is favored. No substantial restriction of diffusion. No dorsal pancreatic duct dilatation. ADRENAL GLANDS: Unremarkable. KIDNEYS: Cyst in the right kidney lower pole appears benign and warrants no further imaging workup. Minimal scarring in the right kidney. LYMPH NODES: 0.9 cm porta hepatis lymph node on image 46 series 21, upper normal size. VASCULATURE: Unremarkable.  PERITONEUM: No ascites. ABDOMINAL WALL: No hernia. No mass. BOWEL: Grossly unremarkable. No bowel obstruction. BONES: Mid to lower thoracic spondylosis. Lumbar spondylosis. SOFT TISSUES: Unremarkable. MISCELLANEOUS: Unremarkable. IMPRESSION: 1. 1.1 cm T2 hyperintense, arterially hyperenhancing lesion in the pancreatic tail tip, favoring pancreatic neuroendocrine tumor; no dorsal pancreatic duct dilatation or diffusion restriction. 2. Cholelithiasis. 3. Incidental findings including a benign-appearing right renal cyst, minimal right renal scarring, and degenerative changes of the mid to lower thoracic spine and lumbar spine. Electronically signed by: Ryan Salvage MD 06/16/2024 08:48 AM EST RP Workstation: [id number]   MR 3D Recon At Scanner Result Date: 06/16/2024 EXAM: MRCP WITH AND WITHOUT IV CONTRAST 06/16/2024 07:38:13 AM TECHNIQUE: Multisequence, multiplanar magnetic resonance images of the abdomen with and without intravenous contrast. MRCP sequences were performed. 7.5 mL gadobutrol (GADAVIST) 1 MMOL/ML injection was administered intravenously. COMPARISON: 06/03/2024 CLINICAL HISTORY: Pancreatic tail  mass, epigastric pain. FINDINGS: LIVER: Unremarkable. GALLBLADDER AND BILIARY SYSTEM: At least 4 gallstones are present in the gallbladder. No intrahepatic or extrahepatic ductal dilation. SPLEEN: Unremarkable. PANCREAS/PANCREATIC DUCT: 1.1 cm in diameter T2 hyperintense lesion of the tip of the pancreatic tail on image 18 series 5 corresponding to the CT finding, demonstrating initial T1 signal hypointensity on image 45 of series 13 followed by late arterial phase hyperenhancement on image 4217, and generally mildly hyperenhancing to isoenhancing on later phase images. Given the arterial phase enhancement characteristics, neuroendocrine tumor is favored. No substantial restriction of diffusion. No dorsal pancreatic duct dilatation. ADRENAL GLANDS: Unremarkable. KIDNEYS: Cyst in the right kidney lower pole appears benign and warrants no further imaging workup. Minimal scarring in the right kidney. LYMPH NODES: 0.9 cm porta hepatis lymph node on image 46 series 21, upper normal size. VASCULATURE: Unremarkable. PERITONEUM: No ascites. ABDOMINAL WALL: No hernia. No mass. BOWEL: Grossly unremarkable. No bowel obstruction. BONES: Mid to lower thoracic spondylosis. Lumbar spondylosis. SOFT TISSUES: Unremarkable. MISCELLANEOUS: Unremarkable. IMPRESSION: 1. 1.1 cm T2 hyperintense, arterially hyperenhancing lesion in the pancreatic tail tip, favoring pancreatic neuroendocrine tumor; no dorsal pancreatic duct dilatation or diffusion restriction. 2. Cholelithiasis. 3. Incidental findings including a benign-appearing right renal cyst, minimal right renal scarring, and degenerative changes of the mid to lower thoracic spine and lumbar spine. Electronically signed by: Ryan Salvage MD 06/16/2024 08:48 AM EST RP Workstation: [id number]    ASSESSMENT/PLAN:  Assessment: Principal Problem:   Hypokalemia Active Problems:   Recent unintentional weight loss over several months   Protein-calorie malnutrition, severe    Hypophosphatemia   Severe recurrent major depression (HCC)   Lesion of tail pancreas   Acute renal injury due to hypovolemia   Frailty syndrome in geriatric patient   Plan: Sarah [name] is a 78 year old woman with chronic poor intake and progressive weight loss, presenting with severe hypokalemia and now found to have a pancreatic tail neuroendocrine tumor on MRCP.  #Hypokalemia- improving (K 2.9 ? 2.2) Patient presented with severe hypokalemia likely due to chronic poor intake, thiazide diuretic use, and mild hypovolemia. No ongoing losses reported. Electrolytes are trending in the right direction with aggressive repletion, and QTc is improving as K rises.  Plan: - Continue  PO potassium repletion today - BMP q4h until K > 3.5, then space - Maintain telemetry - Hold HCTZ permanently - Keep Mg > 2 - Avoid QT-prolonging agents (mirtazapine  held)  #Severe protein-calorie malnutrition with significant weight loss She has experienced >10 lb weight loss over one month, profound anorexia, reduced oral intake, and muscle wasting--meeting criteria for severe malnutrition. This is contributing to electrolyte  instability, fatigue, and functional decline. Likely multifactorial, with suspected pancreatic NET playing a role.  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 No pancreatic duct dilation or metastatic signs. This may explain anorexia and early satiety.  Plan: -  GI/Oncology consult for possible ERCP - Proceed with scheduled EGD/colonoscopy 12/23  #Dyspnea on exertion Echo shows normal systolic and diastolic function with no  structural abnormalities. DOE is most likely due to deconditioning from malnutrition and overall poor functional status. No evidence of fluid overload or ischemia. Plan: - Continue telemetry -PT/OT for graded mobility  #Acute kidney injury - improving (Cr 1.43 ? 1.24) Likely pre-renal from volume depletion and poor intake. Responsive to fluid resuscitation. No evidence of obstruction or intrinsic renal disease. Plan: - Encourage PO fluids - Avoid NSAIDs and nephrotoxins - Daily BMP  #Prolonged QTc Secondary to hypokalemia and several medications. No arrhythmias noted on telemetry. QT should normalize with correction of electrolytes and holding serotonergic/QT-active agents. Plan: - Maintain K > 4 and Mg > 2 - Continue to hold mirtazapine  - Caution with sertraline  (continue for now)  #Hypertension Currently normotensive and occasionally soft, likely due to poor intake and dehydration. No need for antihypertensives during admission. Plan:  Discontinue HCTZ permanently  Reassess BP outpatient after recovery  #Hyperlipidemia Stable chronic condition. Plan: - Continue rosuvastatin    Jalina Blowers [facility]  2:37 PM, 06/16/2024  On Call pager [phone]

## 2024-06-16 NOTE — Consult Note (Addendum)
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     78 year old very pleasant female admitted with generalized weakness, unintentional significant weight loss, decreased appetite Family history of colon cancer No overt GI bleeding but had a decline in hemoglobin since admission, possible hemodilution  Incidental finding of 1.1 cm lesion tip of the pancreas tail, possible neuroendocrine tumor suspected Chromogranin A level pending Will need follow-up surveillance imaging with MRCP in 3 to 6 months, currently the lesion is too small for EUS/ FNA  Will plan to proceed with EGD and colonoscopy for further evaluation of unintentional weight loss and decreased appetite Clear liquids Bowel prep N.p.o. after 5 AM  The patient was provided an opportunity to ask questions and all were answered. The patient agreed with the plan and demonstrated an understanding of the instructions.  LOIS Wilkie Mcgee , MD [phone]

## 2024-06-16 NOTE — Progress Notes (Signed)
 John from Lab called to give critical value of Phosphorus 1.0. Notified provider.

## 2024-06-16 NOTE — Progress Notes (Signed)
 OT Cancellation Note  Patient Details Name: Sarah [name] MRN: [id number] DOB: October 05, 1945   Cancelled Treatment:    Reason Eval/Treat Not Completed: Patient at procedure or test/ unavailable Off unit for echo. Will follow up for OT eval as schedule permits.  Mylz Yuan 06/16/2024, 7:51 AM
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
## 2024-06-16 NOTE — Progress Notes (Signed)
 Echocardiogram 2D Echocardiogram has been performed.  Sarah [name] 06/16/2024, 8:33 AM

## 2024-06-16 NOTE — Evaluation (Signed)
 Occupational Therapy Evaluation Patient Details Name: Sarah [name] MRN: [id number] DOB: 12/12/1945 Today's Date: 06/16/2024   History of Present Illness   Pt is a 78 y/o female presenting from PCP office recommendation due to hypokalemia. Pt also with poor PO intake and progressive weakness. PMH: HTN, HLD, and anxiety.     Clinical Impressions PTA, pt lives alone, reports typically Modified Independent with ADLs, driving and mobility using a rollator. Pt reports progressive endurance and strength deficits impacting ability to manage daily tasks at home. Pt currently able to mobilize in room using RW with CGA and requires CGA-Min A for LB ADLs. Pt able to stand for ADLs at sink approx 3 min before requiring seated rest break due to fatigue. Daughter at bedside reports desire for continued inpatient follow up therapy, <3 hours/day to maximize independence and strength as pt hesitant to allow family/others in to assist at home.     If plan is discharge home, recommend the following:   A little help with bathing/dressing/bathroom;Assistance with cooking/housework;Help with stairs or ramp for entrance     Functional Status Assessment   Patient has had a recent decline in their functional status and demonstrates the ability to make significant improvements in function in a reasonable and predictable amount of time.     Equipment Recommendations   Tub/shower seat;BSC/3in1     Recommendations for Other Services         Precautions/Restrictions   Precautions Precautions: Fall Restrictions Weight Bearing Restrictions Per Provider Order: No     Mobility Bed Mobility Overal bed mobility: Needs Assistance Bed Mobility: Supine to Sit, Sit to Supine     Supine to sit: Supervision Sit to supine: Contact guard assist   General bed mobility comments: CGA for BLE back to bed    Transfers Overall transfer level: Needs assistance Equipment used: Rolling walker (2  wheels) Transfers: Sit to/from Stand Sit to Stand: Contact guard assist                  Balance Overall balance assessment: Needs assistance Sitting-balance support: No upper extremity supported, Feet supported Sitting balance-Leahy Scale: Good     Standing balance support: Bilateral upper extremity supported, During functional activity, No upper extremity supported Standing balance-Leahy Scale: Poor                             ADL either performed or assessed with clinical judgement   ADL Overall ADL's : Needs assistance/impaired Eating/Feeding: Independent   Grooming: Supervision/safety;Standing;Oral care Grooming Details (indicate cue type and reason): able to stand to brush dentures at sink, leaning BUE on sink surface. reports fatigue after standing < 3 min for this task and required seated rest break Upper Body Bathing: Set up   Lower Body Bathing: Sitting/lateral leans;Sit to/from stand;Contact guard assist   Upper Body Dressing : Set up;Sitting   Lower Body Dressing: Contact guard assist;Sit to/from stand;Sitting/lateral leans Lower Body Dressing Details (indicate cue type and reason): able to bend to reach B socks sitting EOB Toilet Transfer: Contact guard assist;Ambulation;Rolling walker (2 wheels)   Toileting- Clothing Manipulation and Hygiene: Supervision/safety;Sitting/lateral lean;Sit to/from stand       Functional mobility during ADLs: Contact guard assist;Rollator (4 wheels)       Vision Ability to See in Adequate Light: 0 Adequate Patient Visual Report: No change from baseline Vision Assessment?: No apparent visual deficits     Perception  Praxis         Pertinent Vitals/Pain Pain Assessment Pain Assessment: No/denies pain     Extremity/Trunk Assessment Upper Extremity Assessment Upper Extremity Assessment: Generalized weakness;Right hand dominant   Lower Extremity Assessment Lower Extremity Assessment: Defer to  PT evaluation   Cervical / Trunk Assessment Cervical / Trunk Assessment: Normal   Communication Communication Communication: No apparent difficulties   Cognition Arousal: Alert Behavior During Therapy: WFL for tasks assessed/performed Cognition: No apparent impairments                               Following commands: Intact       Cueing  General Comments   Cueing Techniques: Verbal cues  Daughter at bedside and would like for pt to go to rehab.   Exercises     Shoulder Instructions      Home Living Family/patient expects to be discharged to:: Private residence Living Arrangements: Alone Available Help at Discharge: Family;Available PRN/intermittently Type of Home: House Home Access: Stairs to enter [employer] of Steps: 2   Home Layout: One level     Bathroom Shower/Tub: Chief Strategy Officer: Standard     Home Equipment: Rollator (4 wheels)          Prior Functioning/Environment Prior Level of Function : Independent/Modified Independent;Driving             Mobility Comments: Rollator for mobility ADLs Comments: Mod I for ADLs, reports standing for shower. fatigues quickly so IADL mgmt is limited at home. still drives, grocery shops (a few items at a time)    OT Problem List: Decreased strength;Decreased activity tolerance;Impaired balance (sitting and/or standing)   OT Treatment/Interventions: Self-care/ADL training;Therapeutic exercise;Energy conservation;DME and/or AE instruction;Therapeutic activities;Patient/family education;Balance training      OT Goals(Current goals can be found in the care plan section)   Acute Rehab OT Goals Patient Stated Goal: family would like pt to go to rehab, pt would like to go home OT Goal Formulation: With patient/family Time For Goal Achievement: 06/30/24 Potential to Achieve Goals: Good ADL Goals Pt Will Perform Lower Body Dressing: with modified independence;sit  to/from stand;sitting/lateral leans Pt Will Transfer to Toilet: with modified independence;ambulating Pt Will Perform Tub/Shower Transfer: Tub transfer;with supervision;ambulating;rolling walker;shower seat Pt/caregiver will Perform Home Exercise Program: Increased strength;Both right and left upper extremity;With theraband;Independently;With written HEP provided Additional ADL Goal #1: Pt to increase standing activity tolerance > 8 min during ADLs/mobility without need for seated rest break Additional ADL Goal #2: Pt to implement at least 3 energy conservation strategies during ADLs/mobility   OT Frequency:  Min 2X/week    Co-evaluation              AM-PAC OT 6 Clicks Daily Activity     Outcome Measure Help from another person eating meals?: None Help from another person taking care of personal grooming?: A Little Help from another person toileting, which includes using toliet, bedpan, or urinal?: A Little Help from another person bathing (including washing, rinsing, drying)?: A Little Help from another person to put on and taking off regular upper body clothing?: A Little Help from another person to put on and taking off regular lower body clothing?: A Little 6 Click Score: 19   End of Session Equipment Utilized During Treatment: Rolling walker (2 wheels) Nurse Communication: Mobility status  Activity Tolerance: Patient tolerated treatment well;Patient limited by fatigue Patient left: in bed;with call bell/phone within reach;with  bed alarm set;with family/visitor present  OT Visit Diagnosis: Unsteadiness on feet (R26.81);Other abnormalities of gait and mobility (R26.89);Muscle weakness (generalized) (M62.81)                Time: 8690-8667 OT Time Calculation (min): 23 min Charges:  OT General Charges $OT Visit: 1 Visit OT Evaluation $OT Eval Low Complexity: 1 Low  Mliss NOVAK, OTR/L Acute Rehab Services Office: [phone]   Mliss Fish 06/16/2024, 1:51 PM

## 2024-06-16 NOTE — Evaluation (Signed)
 Physical Therapy Evaluation Patient Details Name: Sarah [name] MRN: [id number] DOB: April 02, 1946 Today's Date: 06/16/2024  History of Present Illness  Pt is a 78 y/o female presenting from PCP office recommendation due to hypokalemia. Pt also with poor PO intake and progressive weakness. PMH: HTN, HLD, and anxiety.  Clinical Impression  PTA pt living alone in single story home with 2 steps to enter. Pt reports she is independent with ambulation, and drives, does her own medication/financial management, cooks and cleans. When asked if she has forgotten to take medicine or pay bills she reports she has. She also reports feeling depressed lately and has not been eating or getting up much. Pt is limited in safe mobility by decreased safety awareness and memory, as well as decreased hip strength and endurance. Pt is contact guard for bed mobility, min A for Pt reports not wanting to go to SNF when PT poses question about falling and hurting herself and then having to go to Rehab she responds going to Rehab now would probably be better. Daughter is in room and is in agreement. Pt does not want [employer].      If plan is discharge home, recommend the following: A lot of help with walking and/or transfers;A lot of help with bathing/dressing/bathroom;Assistance with cooking/housework;Direct supervision/assist for medications management;Direct supervision/assist for financial management;Assist for transportation;Supervision due to cognitive status;Help with stairs or ramp for entrance   Can travel by private vehicle   Yes    Equipment Recommendations BSC/3in1;Rolling walker (2 wheels)     Functional Status Assessment Patient has had a recent decline in their functional status and demonstrates the ability to make significant improvements in function in a reasonable and predictable amount of time.     Precautions / Restrictions Precautions Precautions: Fall Precaution/Restrictions Comments:  hospitalized due to fall Restrictions Weight Bearing Restrictions Per Provider Order: No      Mobility  Bed Mobility Overal bed mobility: Needs Assistance Bed Mobility: Supine to Sit, Sit to Supine     Supine to sit: Supervision Sit to supine: Contact guard assist   General bed mobility comments: increased time and effort to get LE back into bed, PT provided support while pt moved one leg and then the other into bed.    Transfers Overall transfer level: Needs assistance Equipment used: Rolling walker (2 wheels) Transfers: Sit to/from Stand Sit to Stand: Contact guard assist, Min assist           General transfer comment: good power up and self steadying with initial stand from bed, requires min A for power up from straight back chair in room    Ambulation/Gait Ambulation/Gait assistance: Min assist, Mod assist Gait Distance (Feet): 40 Feet Assistive device: Rolling walker (2 wheels) Gait Pattern/deviations: Step-through pattern Gait velocity: variable Gait velocity interpretation: <1.8 ft/sec, indicate of risk for recurrent falls   General Gait Details: pt starts with strong gait but after about 10 feet says she is feeling weak, PT provides encouragement and she is able to ambulate ~20 feet in hallway with decreasing velocity and decreased foot clearance. Pt reports she can not make it to tthe bed and short sits (without notifying PT) into chair in room. Pt provides modA for getting hips to chair. After rest break pt able to make it 5 feet back to bed.     Balance Overall balance assessment: Needs assistance Sitting-balance support: No upper extremity supported, Feet supported Sitting balance-Leahy Scale: Good     Standing balance support: Bilateral upper  extremity supported, During functional activity, No upper extremity supported Standing balance-Leahy Scale: Poor Standing balance comment: requires UE support and benefits from contact guard for safety                              Pertinent Vitals/Pain Pain Assessment Pain Assessment: No/denies pain    Home Living Family/patient expects to be discharged to:: Private residence Living Arrangements: Alone Available Help at Discharge: Family;Available PRN/intermittently Type of Home: House Home Access: Stairs to enter   [employer] of Steps: 2   Home Layout: One level Home Equipment: Rollator (4 wheels)      Prior Function Prior Level of Function : Independent/Modified Independent;Driving             Mobility Comments: Rollator for mobility, then reports she ususally just holds onto things in her house ADLs Comments: Mod I for ADLs, reports standing for shower. fatigues quickly so IADL mgmt is limited at home. still drives, grocery shops (a few items at a time)     Extremity/Trunk Assessment   Upper Extremity Assessment Upper Extremity Assessment: Defer to OT evaluation    Lower Extremity Assessment Lower Extremity Assessment: RLE deficits/detail;LLE deficits/detail RLE Deficits / Details: Hip strength is 3+/5, knee and ankle grossly 4-/5 RLE Sensation: WNL RLE Coordination: decreased fine motor LLE Deficits / Details: Hip strength is 3+/5, knee and ankle grossly 4-/5 LLE Sensation: WNL LLE Coordination: decreased fine motor    Cervical / Trunk Assessment Cervical / Trunk Assessment: Normal  Communication   Communication Communication: No apparent difficulties    Cognition Arousal: Alert Behavior During Therapy: WFL for tasks assessed/performed   PT - Cognitive impairments: Awareness, Problem solving                       PT - Cognition Comments: pt with decreased awareness of level of deconditioning, and that it is the result of immobility and poor diet. Pt also moves impulsively with decreased safety awareness Following commands: Intact       Cueing Cueing Techniques: Verbal cues     General Comments General comments (skin  integrity, edema, etc.): Daughter and sons at bedside initially, reporting whatever their mother wants is appropriate, however when sons leave and the daughter who provides the most assistance is left in the room she reports getting stronger at rehab would be a better discharge plan        Assessment/Plan    PT Assessment Patient needs continued PT services  PT Problem List Decreased strength;Decreased range of motion;Decreased activity tolerance;Decreased balance;Decreased mobility;Decreased knowledge of use of DME;Decreased safety awareness;Decreased cognition;Decreased coordination       PT Treatment Interventions DME instruction;Gait training;Functional mobility training;Therapeutic activities;Therapeutic exercise;Balance training;Cognitive remediation;Patient/family education    PT Goals (Current goals can be found in the Care Plan section)  Acute Rehab PT Goals PT Goal Formulation: With patient/family Time For Goal Achievement: 06/30/24 Potential to Achieve Goals: Fair    Frequency Min 2X/week        AM-PAC PT 6 Clicks Mobility  Outcome Measure Help needed turning from your back to your side while in a flat bed without using bedrails?: None Help needed moving from lying on your back to sitting on the side of a flat bed without using bedrails?: None Help needed moving to and from a bed to a chair (including a wheelchair)?: A Little Help needed standing up from a chair using your arms (  e.g., wheelchair or bedside chair)?: A Little Help needed to walk in hospital room?: A Little Help needed climbing 3-5 steps with a railing? : A Lot 6 Click Score: 19    End of Session Equipment Utilized During Treatment: Gait belt Activity Tolerance: Patient limited by fatigue Patient left: in bed;with call bell/phone within reach;with bed alarm set;with family/visitor present Nurse Communication: Mobility status PT Visit Diagnosis: Unsteadiness on feet (R26.81);Other abnormalities of  gait and mobility (R26.89);Muscle weakness (generalized) (M62.81);History of falling (Z91.81);Difficulty in walking, not elsewhere classified (R26.2);Adult, failure to thrive (R62.7)    Time: 8940-8877 PT Time Calculation (min) (ACUTE ONLY): 23 min   Charges:   PT Evaluation $PT Eval Low Complexity: 1 Low PT Treatments $Gait Training: 8-22 mins PT General Charges $$ ACUTE PT VISIT: 1 Visit         Jams Trickett B. Fleeta Lapidus PT, DPT Acute Rehabilitation Services Please use secure chat or  Call Office [phone]   Almarie KATHEE Fleeta Fleet 06/16/2024, 2:57 PM

## 2024-06-16 NOTE — H&P (View-Only) (Signed)
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 very pleasant female admitted with generalized weakness, unintentional significant weight loss, decreased appetite Family history of colon cancer No overt GI bleeding but had a decline in hemoglobin since admission, possible hemodilution  Incidental finding of 1.1 cm lesion tip of the pancreas tail, possible neuroendocrine tumor suspected Chromogranin A level pending Will need follow-up surveillance imaging with MRCP in 3 to 6 months, currently the lesion is too small for EUS/ FNA  Will plan to proceed with EGD and colonoscopy for further evaluation of unintentional weight loss and decreased appetite Clear liquids Bowel prep N.p.o. after 5 AM  The patient was provided an opportunity to ask questions and all were answered. The patient agreed with the plan and demonstrated an understanding of the instructions.  LOIS Wilkie Mcgee , MD [phone]

## 2024-06-16 NOTE — Progress Notes (Signed)
 Transition of Care [facility]) - Inpatient Brief Assessment   Patient Details  Name: Sarah [name] MRN: [id number] Date of Birth: 09/05/1945  [facility] CM/SW Contact:    Rosaline JONELLE Joe, RN Phone Number: 06/16/2024, 11:14 AM   Clinical Narrative: CM met with the patient and family at the bedside.  Patient admitted to the hospital for Hypokalemia.  Patient lives alone and plans to return home when stable.  DME at the home includes RW and Rolator.  Patient states that she is independent and drives.  Patient requested information regarding personal care services - form provided and given to daughter to follow up with PCP.  Patient is pending PT/OT evaluation.  CM will continue to follow the patient as patient progresses.   Transition of Care Asessment: Insurance and Status: (P) Insurance coverage has been reviewed Patient has primary care physician: (P) Yes Home environment has been reviewed: (P) from home alone Prior level of function:: (P) self Prior/Current Home Services: (P) No current home services Social Drivers of Health Review: (P) SDOH reviewed needs interventions Readmission risk has been reviewed: (P) Yes Transition of care needs: (P) transition of care needs identified, TOC will continue to follow

## 2024-06-17 ENCOUNTER — Telehealth: Payer: Self-pay | Admitting: Licensed Clinical Social Worker

## 2024-06-17 ENCOUNTER — Encounter (HOSPITAL_COMMUNITY): Admission: EM | Disposition: A | Discharge status: Skilled Nursing Facility | Payer: Self-pay | Source: Home / Self Care

## 2024-06-17 ENCOUNTER — Observation Stay (HOSPITAL_COMMUNITY): Admitting: Anesthesiology

## 2024-06-17 ENCOUNTER — Encounter (HOSPITAL_COMMUNITY): Payer: Self-pay | Admitting: Internal Medicine

## 2024-06-17 HISTORY — PX: POLYPECTOMY: SHX149

## 2024-06-17 HISTORY — PX: COLONOSCOPY: SHX5424

## 2024-06-17 HISTORY — PX: BIOPSY OF SKIN SUBCUTANEOUS TISSUE AND/OR MUCOUS MEMBRANE: SHX6741

## 2024-06-17 HISTORY — PX: ESOPHAGOGASTRODUODENOSCOPY: SHX5428

## 2024-06-17 LAB — CBC
HCT: 34.2 % — ABNORMAL LOW (ref 36.0–46.0)
Hemoglobin: 11.5 g/dL — ABNORMAL LOW (ref 12.0–15.0)
MCH: 28.8 pg (ref 26.0–34.0)
MCHC: 33.6 g/dL (ref 30.0–36.0)
MCV: 85.5 fL (ref 80.0–100.0)
Platelets: 313 K/uL (ref 150–400)
RBC: 4 MIL/uL (ref 3.87–5.11)
RDW: 15.8 % — ABNORMAL HIGH (ref 11.5–15.5)
WBC: 6.7 K/uL (ref 4.0–10.5)
nRBC: 0 % (ref 0.0–0.2)

## 2024-06-17 LAB — COMPREHENSIVE METABOLIC PANEL WITH GFR
ALT: 37 U/L (ref 0–44)
AST: 87 U/L — ABNORMAL HIGH (ref 15–41)
Albumin: 2.9 g/dL — ABNORMAL LOW (ref 3.5–5.0)
Alkaline Phosphatase: 71 U/L (ref 38–126)
Anion gap: 14 (ref 5–15)
BUN: 6 mg/dL — ABNORMAL LOW (ref 8–23)
CO2: 30 mmol/L (ref 22–32)
Calcium: 8.5 mg/dL — ABNORMAL LOW (ref 8.9–10.3)
Chloride: 95 mmol/L — ABNORMAL LOW (ref 98–111)
Creatinine, Ser: 1.24 mg/dL — ABNORMAL HIGH (ref 0.44–1.00)
GFR, Estimated: 45 mL/min — ABNORMAL LOW (ref >60)
Glucose, Bld: 87 mg/dL (ref 70–99)
Potassium: 3.3 mmol/L — ABNORMAL LOW (ref 3.5–5.1)
Sodium: 139 mmol/L (ref 135–145)
Total Bilirubin: 0.6 mg/dL (ref 0.0–1.2)
Total Protein: 6.9 g/dL (ref 6.5–8.1)

## 2024-06-17 LAB — BASIC METABOLIC PANEL WITH GFR
Anion gap: 12 (ref 5–15)
BUN: 5 mg/dL — ABNORMAL LOW (ref 8–23)
CO2: 26 mmol/L (ref 22–32)
Calcium: 8.6 mg/dL — ABNORMAL LOW (ref 8.9–10.3)
Chloride: 103 mmol/L (ref 98–111)
Creatinine, Ser: 1.12 mg/dL — ABNORMAL HIGH (ref 0.44–1.00)
GFR, Estimated: 51 mL/min — ABNORMAL LOW (ref >60)
Glucose, Bld: 140 mg/dL — ABNORMAL HIGH (ref 70–99)
Potassium: 3.3 mmol/L — ABNORMAL LOW (ref 3.5–5.1)
Sodium: 141 mmol/L (ref 135–145)

## 2024-06-17 LAB — CHROMOGRANIN A: Chromogranin A (ng/mL): 127.8 ng/mL — ABNORMAL HIGH (ref 0.0–101.8)

## 2024-06-17 LAB — MAGNESIUM: Magnesium: 2.2 mg/dL (ref 1.7–2.4)

## 2024-06-17 SURGERY — COLONOSCOPY
Anesthesia: Monitor Anesthesia Care

## 2024-06-17 MED ORDER — PANTOPRAZOLE SODIUM 40 MG PO TBEC
40.0000 mg | DELAYED_RELEASE_TABLET | Freq: Every day | ORAL | Status: AC
  Administered 2024-06-17 – 2024-06-25 (×9): 40 mg via ORAL
  Filled 2024-06-17 (×9): qty 1

## 2024-06-17 MED ORDER — SODIUM CHLORIDE 0.9 % IV SOLN: INTRAVENOUS | Status: DC | PRN

## 2024-06-17 MED ORDER — POTASSIUM CHLORIDE CRYS ER 20 MEQ PO TBCR
40.0000 meq | EXTENDED_RELEASE_TABLET | Freq: Two times a day (BID) | ORAL | Status: AC
  Administered 2024-06-17 (×2): 40 meq via ORAL
  Filled 2024-06-17 (×2): qty 2

## 2024-06-17 MED ORDER — PHENYLEPHRINE HCL-NACL 20-0.9 MG/250ML-% IV SOLN
INTRAVENOUS | Status: DC | PRN
  Administered 2024-06-17: 30 ug/min via INTRAVENOUS

## 2024-06-17 MED ORDER — PHENYLEPHRINE HCL (PRESSORS) 10 MG/ML IV SOLN
INTRAVENOUS | Status: DC | PRN
  Administered 2024-06-17: 160 ug via INTRAVENOUS
  Administered 2024-06-17: 80 ug via INTRAVENOUS

## 2024-06-17 MED ORDER — SODIUM CHLORIDE 0.9 % IV SOLN
INTRAVENOUS | Status: AC | PRN
  Administered 2024-06-17: 500 mL via INTRAMUSCULAR

## 2024-06-17 MED ORDER — HYDROXYZINE HCL 10 MG PO TABS: 10.0000 mg | ORAL_TABLET | Freq: Three times a day (TID) | ORAL | Status: DC | PRN

## 2024-06-17 MED ORDER — PROPOFOL 10 MG/ML IV BOLUS
INTRAVENOUS | Status: DC | PRN
  Administered 2024-06-17: 50 mg via INTRAVENOUS

## 2024-06-17 MED ORDER — PROPOFOL 500 MG/50ML IV EMUL
INTRAVENOUS | Status: DC | PRN
  Administered 2024-06-17: 150 ug/kg/min via INTRAVENOUS

## 2024-06-17 NOTE — Anesthesia Postprocedure Evaluation (Signed)
 Anesthesia Post Note  Patient: Sarah [name]  Procedure(s) Performed: COLONOSCOPY EGD (ESOPHAGOGASTRODUODENOSCOPY) BIOPSY, SKIN, SUBCUTANEOUS TISSUE, OR MUCOUS MEMBRANE POLYPECTOMY, INTESTINE     Patient location during evaluation: Endoscopy Anesthesia Type: MAC Level of consciousness: awake and alert Pain management: pain level controlled Vital Signs Assessment: post-procedure vital signs reviewed and stable Respiratory status: spontaneous breathing, nonlabored ventilation and respiratory function stable Cardiovascular status: stable and blood pressure returned to baseline Postop Assessment: no apparent nausea or vomiting Anesthetic complications: no   There were no known notable events for this encounter.  Last Vitals:  Vitals:   06/17/24 1350 06/17/24 1403  BP: 117/60 120/67  Pulse: 71 66  Resp: 18 18  Temp:  36.4 C  SpO2: 96%     Last Pain:  Vitals:   06/17/24 1350  TempSrc:   PainSc: 0-No pain                 Garnette FORBES Skillern

## 2024-06-17 NOTE — Transfer of Care (Signed)
 Immediate Anesthesia Transfer of Care Note  Patient: Sarah [name]  Procedure(s) Performed: COLONOSCOPY EGD (ESOPHAGOGASTRODUODENOSCOPY) BIOPSY, SKIN, SUBCUTANEOUS TISSUE, OR MUCOUS MEMBRANE POLYPECTOMY, INTESTINE  Patient Location: PACU  Anesthesia Type:MAC  Level of Consciousness: drowsy, patient cooperative, and responds to stimulation  Airway & Oxygen Therapy: Patient Spontanous Breathing and Patient connected to nasal cannula oxygen  Post-op Assessment: Report given to RN and Post -op Vital signs reviewed and stable  Post vital signs: Reviewed and stable  Last Vitals:  Vitals Value Taken Time  BP 99/44 06/17/24 1300  Temp    Pulse 69 06/17/24 1300  Resp 12 06/17/24 1300  SpO2 96% 06/17/24 1300    Last Pain:  Vitals:   06/17/24 1137  TempSrc: Temporal  PainSc: 0-No pain         Complications: No notable events documented.

## 2024-06-17 NOTE — Progress Notes (Signed)
 HD#3 SUBJECTIVE:  Patient Summary: Sarah [name] is a 78 y.o. with a pertinent PMH of hypertension, hyperlipidemia, chronic poor oral intake, and progressive unintentional weight loss, who presented with severe hypokalemia (K 2.2) and was admitted for electrolyte derangements and evaluation of weight loss with concern for underlying malignancy and scheduled for EGD/Colonoscopy today after GI visit, pending chromogranin.   Overnight Events: NAEON  Interim History: Patient was resting in bed with no acute distress. She reported feeling nervous but denied any new symptoms such as chest pain or shortness of breath. She continues to feel fatigued. During our encounter, she requested to use the bathroom after completing her colonoscopy prep.  OBJECTIVE:  Vital Signs: Vitals:   06/16/24 1933 06/17/24 0014 06/17/24 0438 06/17/24 0722  BP: 131/72 119/60 (!) 128/57 130/79  Pulse: 81 71 82 79  Resp: 20 18 20 18   Temp: 98.1 F (36.7 C) 98.1 F (36.7 C) (!) 97.5 F (36.4 C) 97.7 F (36.5 C)  TempSrc: Oral Oral Oral   SpO2: 100% 98% 98% 98%  Weight:      Height:       Supplemental O2: Room Air SpO2: 98 %  Filed Weights   06/16/24 0333  Weight: 78 kg     Intake/Output Summary (Last 24 hours) at 06/17/2024 9266 Last data filed at 06/16/2024 2100 Gross per 24 hour  Intake 278.56 ml  Output --  Net 278.56 ml   Net IO Since Admission: 1,556.44 mL [06/17/24 0733]  Physical Exam: Physical Exam General: Tired-appearing female, nervous,  no acute distress. HEENT: Dry mucous membranes. CV: RRR, no murmurs. Resp: CTAB, normal work of breathing. Abd: Soft, non-tender, non-distended. Ext: No edema. Skin: Warm, dry, decreased turgor. Neuro: Alert and oriented 3.physical muscle strength: distal lower ext and upper ext: 5/5 Psych: Depressed affect.  Patient Lines/Drains/Airways Status     Active Line/Drains/Airways     Name Placement date Placement time Site Days   Peripheral IV  06/15/24 20 G 1 Left Antecubital 06/15/24  1226  Antecubital  2            Pertinent labs and imaging:      Latest Ref Rng & Units 06/17/2024    5:29 AM 06/16/2024    4:39 AM 06/15/2024   10:30 AM  CBC  WBC 4.0 - 10.5 K/uL 6.7  7.9  9.4   Hemoglobin 12.0 - 15.0 g/dL 88.4  89.0  86.9   Hematocrit 36.0 - 46.0 % 34.2  31.9  38.2   Platelets 150 - 400 K/uL 313  275  351        Latest Ref Rng & Units 06/17/2024    5:29 AM 06/16/2024   10:08 PM 06/16/2024    3:14 PM  CMP  Glucose 70 - 99 mg/dL 87  882  82   BUN 8 - 23 mg/dL 6  6  7    Creatinine 0.44 - 1.00 mg/dL 8.75  8.83  8.76   Sodium 135 - 145 mmol/L 139  141  138   Potassium 3.5 - 5.1 mmol/L 3.3  3.2  3.0   Chloride 98 - 111 mmol/L 95  98  96   CO2 22 - 32 mmol/L 30  32  33   Calcium  8.9 - 10.3 mg/dL 8.5  8.6  8.6   Total Protein 6.5 - 8.1 g/dL 6.9     Total Bilirubin 0.0 - 1.2 mg/dL 0.6     Alkaline Phos 38 - 126 U/L 71  AST 15 - 41 U/L 87     ALT 0 - 44 U/L 37       ECHOCARDIOGRAM COMPLETE Result Date: 06/16/2024    ECHOCARDIOGRAM REPORT   Patient Name:   Sarah [name] Date of Exam: 06/16/2024 Medical Rec #:  [id number]      Height:       64.0 in Accession #:    [id number]     Weight:       172.0 lb Date of Birth:  06-06-1946      BSA:          1.835 m Patient Age:    78 years       BP:           108/53 mmHg Patient Gender: F              HR:           76 bpm. Exam Location:  Inpatient Procedure: 2D Echo, Cardiac Doppler and Color Doppler (Both Spectral and Color            Flow Doppler were utilized during procedure). Indications:    Dyspnea R06.00  History:        Patient has no prior history of Echocardiogram examinations.                 CKD; Risk Factors:Hypertension.  Sonographer:    Merlynn Argyle Referring Phys: [id number] MEGAN L [facility]  Sonographer Comments: Image acquisition challenging due to respiratory motion. IMPRESSIONS  1. Left ventricular ejection fraction, by estimation, is 60 to 65%. The left ventricle has  normal function. The left ventricle has no regional wall motion abnormalities. Left ventricular diastolic parameters were normal.  2. Right ventricular systolic function is normal. The right ventricular size is normal.  3. The mitral valve is normal in structure. No evidence of mitral valve regurgitation. No evidence of mitral stenosis.  4. The aortic valve was not well visualized. Aortic valve regurgitation is not visualized. Aortic valve sclerosis/calcification is present, without any evidence of aortic stenosis. Comparison(s): No prior Echocardiogram. FINDINGS  Left Ventricle: Left ventricular ejection fraction, by estimation, is 60 to 65%. The left ventricle has normal function. The left ventricle has no regional wall motion abnormalities. The left ventricular internal cavity size was normal in size. There is  no left ventricular hypertrophy. Left ventricular diastolic parameters were normal. Right Ventricle: The right ventricular size is normal. No increase in right ventricular wall thickness. Right ventricular systolic function is normal. Left Atrium: Left atrial size was normal in size. Right Atrium: Right atrial size was normal in size. Pericardium: There is no evidence of pericardial effusion. Mitral Valve: The mitral valve is normal in structure. No evidence of mitral valve regurgitation. No evidence of mitral valve stenosis. Tricuspid Valve: The tricuspid valve is normal in structure. Tricuspid valve regurgitation is mild . No evidence of tricuspid stenosis. Aortic Valve: The aortic valve was not well visualized. Aortic valve regurgitation is not visualized. Aortic valve sclerosis/calcification is present, without any evidence of aortic stenosis. Pulmonic Valve: The pulmonic valve was not well visualized. Pulmonic valve regurgitation is not visualized. No evidence of pulmonic stenosis. Aorta: The aortic root is normal in size and structure. Venous: The inferior vena cava was not well visualized.  IAS/Shunts: No atrial level shunt detected by color flow Doppler.  LEFT VENTRICLE PLAX 2D LVIDd:         4.10 cm   Diastology LVIDs:  2.90 cm   LV e' medial:    9.79 cm/s LV PW:         1.00 cm   LV E/e' medial:  7.4 LV IVS:        0.80 cm   LV e' lateral:   9.48 cm/s LVOT diam:     1.90 cm   LV E/e' lateral: 7.6 LV SV:         72 LV SV Index:   39 LVOT Area:     2.84 cm LV IVRT:       113 msec  RIGHT VENTRICLE             IVC RV Basal diam:  2.50 cm     IVC diam: 2.20 cm RV S prime:     11.30 cm/s TAPSE (M-mode): 2.4 cm LEFT ATRIUM             Index        RIGHT ATRIUM          Index LA diam:        3.10 cm 1.69 cm/m   RA Area:     9.25 cm LA Vol (A2C):   39.3 ml 21.42 ml/m  RA Volume:   17.90 ml 9.76 ml/m LA Vol (A4C):   29.8 ml 16.24 ml/m LA Biplane Vol: 34.3 ml 18.69 ml/m  AORTIC VALVE LVOT Vmax:   104.00 cm/s LVOT Vmean:  70.300 cm/s LVOT VTI:    0.254 m  AORTA Ao Root diam: 3.10 cm Ao Asc diam:  3.00 cm MITRAL VALVE                TRICUSPID VALVE MV Area (PHT): 2.07 cm     TR Peak grad:   24.4 mmHg MV Decel Time: 366 msec     TR Vmax:        247.00 cm/s MV E velocity: 72.00 cm/s MV A velocity: 116.00 cm/s  SHUNTS MV E/A ratio:  0.62         Systemic VTI:  0.25 m                             Systemic Diam: 1.90 cm Joelle Azobou Tonleu Electronically signed by Joelle Cedars Tonleu Signature Date/Time: 06/16/2024/9:22:24 AM    Final    MR ABDOMEN MRCP W WO CONTAST Result Date: 06/16/2024 EXAM: MRCP WITH AND WITHOUT IV CONTRAST 06/16/2024 07:38:13 AM TECHNIQUE: Multisequence, multiplanar magnetic resonance images of the abdomen with and without intravenous contrast. MRCP sequences were performed. 7.5 mL gadobutrol  (GADAVIST ) 1 MMOL/ML injection was administered intravenously. COMPARISON: 06/03/2024 CLINICAL HISTORY: Pancreatic tail mass, epigastric pain. FINDINGS: LIVER: Unremarkable. GALLBLADDER AND BILIARY SYSTEM: At least 4 gallstones are present in the gallbladder. No intrahepatic or  extrahepatic ductal dilation. SPLEEN: Unremarkable. PANCREAS/PANCREATIC DUCT: 1.1 cm in diameter T2 hyperintense lesion of the tip of the pancreatic tail on image 18 series 5 corresponding to the CT finding, demonstrating initial T1 signal hypointensity on image 45 of series 13 followed by late arterial phase hyperenhancement on image 4217, and generally mildly hyperenhancing to isoenhancing on later phase images. Given the arterial phase enhancement characteristics, neuroendocrine tumor is favored. No substantial restriction of diffusion. No dorsal pancreatic duct dilatation. ADRENAL GLANDS: Unremarkable. KIDNEYS: Cyst in the right kidney lower pole appears benign and warrants no further imaging workup. Minimal scarring in the right kidney. LYMPH NODES: 0.9 cm porta hepatis lymph node on image 46 series 21,  upper normal size. VASCULATURE: Unremarkable. PERITONEUM: No ascites. ABDOMINAL WALL: No hernia. No mass. BOWEL: Grossly unremarkable. No bowel obstruction. BONES: Mid to lower thoracic spondylosis. Lumbar spondylosis. SOFT TISSUES: Unremarkable. MISCELLANEOUS: Unremarkable. IMPRESSION: 1. 1.1 cm T2 hyperintense, arterially hyperenhancing lesion in the pancreatic tail tip, favoring pancreatic neuroendocrine tumor; no dorsal pancreatic duct dilatation or diffusion restriction. 2. Cholelithiasis. 3. Incidental findings including a benign-appearing right renal cyst, minimal right renal scarring, and degenerative changes of the mid to lower thoracic spine and lumbar spine. Electronically signed by: Ryan Salvage MD 06/16/2024 08:48 AM EST RP Workstation: [id number]   MR 3D Recon At Scanner Result Date: 06/16/2024 EXAM: MRCP WITH AND WITHOUT IV CONTRAST 06/16/2024 07:38:13 AM TECHNIQUE: Multisequence, multiplanar magnetic resonance images of the abdomen with and without intravenous contrast. MRCP sequences were performed. 7.5 mL gadobutrol  (GADAVIST ) 1 MMOL/ML injection was administered intravenously.  COMPARISON: 06/03/2024 CLINICAL HISTORY: Pancreatic tail mass, epigastric pain. FINDINGS: LIVER: Unremarkable. GALLBLADDER AND BILIARY SYSTEM: At least 4 gallstones are present in the gallbladder. No intrahepatic or extrahepatic ductal dilation. SPLEEN: Unremarkable. PANCREAS/PANCREATIC DUCT: 1.1 cm in diameter T2 hyperintense lesion of the tip of the pancreatic tail on image 18 series 5 corresponding to the CT finding, demonstrating initial T1 signal hypointensity on image 45 of series 13 followed by late arterial phase hyperenhancement on image 4217, and generally mildly hyperenhancing to isoenhancing on later phase images. Given the arterial phase enhancement characteristics, neuroendocrine tumor is favored. No substantial restriction of diffusion. No dorsal pancreatic duct dilatation. ADRENAL GLANDS: Unremarkable. KIDNEYS: Cyst in the right kidney lower pole appears benign and warrants no further imaging workup. Minimal scarring in the right kidney. LYMPH NODES: 0.9 cm porta hepatis lymph node on image 46 series 21, upper normal size. VASCULATURE: Unremarkable. PERITONEUM: No ascites. ABDOMINAL WALL: No hernia. No mass. BOWEL: Grossly unremarkable. No bowel obstruction. BONES: Mid to lower thoracic spondylosis. Lumbar spondylosis. SOFT TISSUES: Unremarkable. MISCELLANEOUS: Unremarkable. IMPRESSION: 1. 1.1 cm T2 hyperintense, arterially hyperenhancing lesion in the pancreatic tail tip, favoring pancreatic neuroendocrine tumor; no dorsal pancreatic duct dilatation or diffusion restriction. 2. Cholelithiasis. 3. Incidental findings including a benign-appearing right renal cyst, minimal right renal scarring, and degenerative changes of the mid to lower thoracic spine and lumbar spine. Electronically signed by: Ryan Salvage MD 06/16/2024 08:48 AM EST RP Workstation: [id number]    ASSESSMENT/PLAN:  Assessment: Principal Problem:   Hypokalemia Active Problems:   Severe recurrent major depression (HCC)    Unintentional weight loss   Lesion of tail pancreas   Protein-calorie malnutrition, severe   Hypophosphatemia   Acute renal injury due to hypovolemia   Frailty syndrome in geriatric patient   Mass of pancreas   Plan: Sarah [name] is a 78 y.o. with a pertinent PMH of hypertension, hyperlipidemia, chronic poor oral intake, and progressive unintentional weight loss, who presented with severe hypokalemia (K 2.2) and was admitted for electrolyte derangements and evaluation of weight loss with concern for underlying malignancy and scheduled for EGD/Colonoscopy today after GI visit, pending chromogranin.   #Hypokalemia- improving (K 3.3 ? 2.2) Patient presented with severe hypokalemia likely due to chronic poor intake, thiazide diuretic use, and mild hypovolemia. No ongoing losses reported. Electrolytes are trending in the right direction with aggressive repletion, and QTc is improving as K rises. 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 No pancreatic duct dilation or metastatic signs.  She reports a 30-lb unintentional weight loss and decreased appetite but no abdominal pain or GI bleeding. Labs show mild anemia (likely dilutional), mild AST elevation, and stable CKD. Chromogranin A is pending, and the pancreatic lesion is too small for biopsy; follow-up MRCP is recommended in 3-6 months. GI plans to perform EGD and colonoscopy during this hospitalization to further  evaluate her weight loss and rule out additional GI malignancy. The patient agrees with the plan. Plan: - GI/Oncology consult  - EGD and colonoscopy today  #Severe protein-calorie malnutrition with significant weight loss She has experienced >30 lb weight loss over one month, profound anorexia, reduced oral intake, and muscle wasting, meeting criteria for severe malnutrition. This is contributing to electrolyte instability, fatigue, and functional decline. Likely multifactorial, with suspected pancreatic NET playing a role. Plan: - Dietitian following; high-calorie supplementation - Encourage PO intake; calorie counts - Daily weights - Multivitamin + thiamine  - Monitor electrolytes closely (refeeding risk)  #Dyspnea on exertion Echo shows normal systolic and diastolic function with no structural abnormalities. DOE is most likely due to deconditioning from malnutrition and overall poor functional status. No evidence of fluid overload or ischemia. Plan: - Continue telemetry - PT/OT for graded mobility  #Acute kidney injury - improving (Cr 1.43 ? 1.24) Likely pre-renal from volume depletion and poor intake. Responsive to fluid resuscitation. No evidence of obstruction or intrinsic renal disease. Plan: - Encourage PO fluids - Avoid NSAIDs and nephrotoxins - Daily BMP  #Prolonged QTc Secondary to hypokalemia and several medications. No arrhythmias noted on telemetry. QT should normalize with correction of electrolytes and holding serotonergic/QT-active agents. Plan: - Maintain K > 4 and Mg > 2 - Continue to hold mirtazapine  - Caution with sertraline  (continue for now)  #Hypertension Currently normotensive and occasionally soft, likely due to poor intake and dehydration. No need for antihypertensives during admission. Plan:  Discontinue HCTZ permanently  Reassess BP outpatient after recovery  #Hyperlipidemia Stable chronic condition. Plan: - Continue rosuvastatin     Best  Practice: Diet: Regular diet PCQ:Wnwz VTE: enoxaparin  (LOVENOX ) injection 40 mg Start: 06/15/24 1445 Code: Full  Disposition planning: Therapy Recs: None, DME: none Family Contact: , at bedside. DISPO: Anticipated discharge in TBD days to Skilled nursing facility   Signature:  [facility]  7:33 AM, 06/17/2024  On Call pager [phone]

## 2024-06-17 NOTE — Plan of Care (Signed)
   Problem: Education: Goal: Knowledge of General Education information will improve Description: Including pain rating scale, medication(s)/side effects and non-pharmacologic comfort measures Outcome: Progressing   Problem: Health Behavior/Discharge Planning: Goal: Ability to manage health-related needs will improve Outcome: Progressing   Problem: Nutrition: Goal: Adequate nutrition will be maintained Outcome: Progressing   Problem: Activity: Goal: Risk for activity intolerance will decrease Outcome: Progressing

## 2024-06-17 NOTE — Plan of Care (Signed)
  Problem: Education: Goal: Knowledge of General Education information will improve Description: Including pain rating scale, medication(s)/side effects and non-pharmacologic comfort measures Outcome: Progressing   Problem: Health Behavior/Discharge Planning: Goal: Ability to manage health-related needs will improve Outcome: Progressing   Problem: Clinical Measurements: Goal: Ability to maintain clinical measurements within normal limits will improve Outcome: Progressing Goal: Will remain free from infection Outcome: Progressing Goal: Respiratory complications will improve Outcome: Progressing Goal: Cardiovascular complication will be avoided Outcome: Progressing   Problem: Activity: Goal: Risk for activity intolerance will decrease Outcome: Progressing   Problem: Nutrition: Goal: Adequate nutrition will be maintained Outcome: Progressing   Problem: Elimination: Goal: Will not experience complications related to bowel motility Outcome: Progressing Goal: Will not experience complications related to urinary retention Outcome: Progressing

## 2024-06-17 NOTE — Op Note (Signed)
 [facility] Patient Name: Sarah [name] Procedure Date : 06/17/2024 MRN: [id number] Attending MD: Gustav ALONSO Mcgee , MD, [id number] Date of Birth: 07-04-1946 CSN: [id number] Age: 78 Admit Type: Inpatient Procedure:                Upper GI endoscopy Indications:              Anorexia, Early satiety, Failure to thrive, Weight                            loss Providers:                Gustav ALONSO Mcgee, MD, Jacquelyn Jaci Pierce,                            RN, Felice Sar, Technician Referring MD:              Medicines:                Monitored Anesthesia Care Complications:            No immediate complications. Estimated Blood Loss:     Estimated blood loss was minimal. Procedure:                Pre-Anesthesia Assessment:                           - Prior to the procedure, a History and Physical                            was performed, and patient medications and                            allergies were reviewed. The patient's tolerance of                            previous anesthesia was also reviewed. The risks                            and benefits of the procedure and the sedation                            options and risks were discussed with the patient.                            All questions were answered, and informed consent                            was obtained. Prior Anticoagulants: The patient has                            taken no anticoagulant or antiplatelet agents. ASA                            Grade Assessment: III - A patient with severe  systemic disease. After reviewing the risks and                            benefits, the patient was deemed in satisfactory                            condition to undergo the procedure.                           After obtaining informed consent, the endoscope was                            passed under direct vision. Throughout the                            procedure, the  patient's blood pressure, pulse, and                            oxygen saturations were monitored continuously. The                            [id number]-[id number] ([id number]) Olympus endoscope was introduced                            through the mouth, and advanced to the second part                            of duodenum. The upper GI endoscopy was                            accomplished without difficulty. The patient                            tolerated the procedure well. Scope In: Scope Out: Findings:      The Z-line was regular and was found 38 cm from the incisors.      No gross lesions were noted in the entire esophagus.      Patchy moderate inflammation characterized by congestion (edema),       erosions, erythema and friability was found in the gastric body.       Biopsies were taken with a cold forceps for histology. Biopsies were       taken with a cold forceps for Helicobacter pylori testing.      The cardia and gastric fundus were normal on retroflexion.      The examined duodenum was normal. Impression:               - Z-line regular, 38 cm from the incisors.                           - No gross lesions in the entire esophagus.                           - Gastritis. Biopsied.                           -  Normal examined duodenum. Recommendation:           - Resume previous diet.                           - Continue present medications.                           - Await pathology results.                           - No ibuprofen , naproxen , or other non-steroidal                            anti-inflammatory drugs.                           - Use Protonix  (pantoprazole ) 40 mg PO daily.                           - Follow up in GI office in 2 months, repeat MRCP                            in 3 months for follow up pancreas lesion                           - Inpatient GI signing off, available if have any                            questions Procedure Code(s):        --- Professional  ---                           [phone], Esophagogastroduodenoscopy, flexible,                            transoral; with biopsy, single or multiple Diagnosis Code(s):        --- Professional ---                           K29.70, Gastritis, unspecified, without bleeding                           R63.0, Anorexia                           R68.81, Early satiety                           R62.7, Adult failure to thrive                           R63.4, Abnormal weight loss CPT copyright 2022 American Medical Association. All rights reserved. The codes documented in this report are preliminary and upon coder review may  be revised to meet current compliance requirements. [name] V. Jasmine Maceachern, MD 06/17/2024 1:37:10 PM This report has been signed electronically. Number of Addenda: 0

## 2024-06-17 NOTE — Op Note (Signed)
 [facility] Patient Name: Sarah [name] Procedure Date : 06/17/2024 MRN: [id number] Attending MD: Gustav ALONSO Mcgee , MD, [id number] Date of Birth: 1946/02/02 CSN: [id number] Age: 78 Admit Type: Inpatient Procedure:                Colonoscopy Indications:              Failure to thrive, Weight loss Providers:                Gustav ALONSO Mcgee, MD, Jacquelyn Jaci Pierce,                            RN, Felice Sar, Technician Referring MD:              Medicines:                Monitored Anesthesia Care Complications:            No immediate complications. Estimated Blood Loss:     Estimated blood loss was minimal. Procedure:                Pre-Anesthesia Assessment:                           - Prior to the procedure, a History and Physical                            was performed, and patient medications and                            allergies were reviewed. The patient's tolerance of                            previous anesthesia was also reviewed. The risks                            and benefits of the procedure and the sedation                            options and risks were discussed with the patient.                            All questions were answered, and informed consent                            was obtained. Prior Anticoagulants: The patient has                            taken no anticoagulant or antiplatelet agents. ASA                            Grade Assessment: III - A patient with severe                            systemic disease. After reviewing the risks and  benefits, the patient was deemed in satisfactory                            condition to undergo the procedure.                           After obtaining informed consent, the colonoscope                            was passed under direct vision. Throughout the                            procedure, the patient's blood pressure, pulse, and                             oxygen saturations were monitored continuously. The                            [id number]-HQ190L ([id number]) Olympus colonoscope was                            introduced through the anus and advanced to the the                            terminal ileum, with identification of the                            appendiceal orifice and IC valve. The colonoscopy                            was performed without difficulty. The patient                            tolerated the procedure well. The quality of the                            bowel preparation was good. The ileocecal valve,                            appendiceal orifice, and rectum were photographed. Scope In: Scope Out: 12:53:40 PM Scope Withdrawal Time: 0 hours 16 minutes 38 seconds  Findings:      The perianal and digital rectal examinations were normal.      Two sessile polyps were found in the transverse colon and ascending       colon. The polyps were 4 to 5 mm in size. These polyps were removed with       a cold snare. Resection and retrieval were complete.      A few small-mouthed diverticula were found in the sigmoid colon.      Non-bleeding external and internal hemorrhoids were found during       retroflexion. The hemorrhoids were medium-sized. Impression:               - Two 4 to 5 mm polyps in the transverse colon and  in the ascending colon, removed with a cold snare.                            Resected and retrieved.                           - Diverticulosis in the sigmoid colon.                           - Non-bleeding external and internal hemorrhoids. Recommendation:           - Resume previous diet.                           - Continue present medications.                           - Await pathology results.                           - No repeat colonoscopy due to age. Procedure Code(s):        --- Professional ---                           [phone], Colonoscopy, flexible; with removal of                             tumor(s), polyp(s), or other lesion(s) by snare                            technique Diagnosis Code(s):        --- Professional ---                           D12.3, Benign neoplasm of transverse colon (hepatic                            flexure or splenic flexure)                           D12.2, Benign neoplasm of ascending colon                           K64.8, Other hemorrhoids                           R62.7, Adult failure to thrive                           R63.4, Abnormal weight loss                           K57.30, Diverticulosis of large intestine without                            perforation or abscess without bleeding CPT copyright 2022 American Medical Association. All rights reserved. The codes documented in this report are preliminary and upon coder  review may  be revised to meet current compliance requirements. Isam Unrein V. Keina Mutch, MD 06/17/2024 1:20:35 PM This report has been signed electronically. Number of Addenda: 0

## 2024-06-17 NOTE — Anesthesia Preprocedure Evaluation (Signed)
 Anesthesia Evaluation  Patient identified by MRN, date of birth, ID band Patient awake    Reviewed: Allergy & Precautions, NPO status , Patient's Chart, lab work & pertinent test results  Airway Mallampati: II  TM Distance: >3 FB Neck ROM: Full    Dental  (+) Teeth Intact, Dental Advisory Given, Lower Dentures, Upper Dentures   Pulmonary sleep apnea    Pulmonary exam normal breath sounds clear to auscultation       Cardiovascular hypertension, Pt. on medications Normal cardiovascular exam Rhythm:Regular Rate:Normal     Neuro/Psych  PSYCHIATRIC DISORDERS Anxiety Depression    negative neurological ROS     GI/Hepatic negative GI ROS, Neg liver ROS,,,  Endo/Other  negative endocrine ROS    Renal/GU Renal InsufficiencyRenal disease     Musculoskeletal negative musculoskeletal ROS (+)    Abdominal   Peds  Hematology negative hematology ROS (+)   Anesthesia Other Findings Day of surgery medications reviewed with the patient.  Reproductive/Obstetrics                              Anesthesia Physical Anesthesia Plan  ASA: 3  Anesthesia Plan: MAC   Post-op Pain Management:    Induction: Intravenous  PONV Risk Score and Plan: 2 and Dexamethasone , TIVA and Ondansetron   Airway Management Planned: Natural Airway and Simple Face Mask  Additional Equipment:   Intra-op Plan:   Post-operative Plan:   Informed Consent: I have reviewed the patients History and Physical, chart, labs and discussed the procedure including the risks, benefits and alternatives for the proposed anesthesia with the patient or authorized representative who has indicated his/her understanding and acceptance.     Dental advisory given  Plan Discussed with: CRNA and Anesthesiologist  Anesthesia Plan Comments:          Anesthesia Quick Evaluation

## 2024-06-17 NOTE — Interval H&P Note (Signed)
 History and Physical Interval Note:  06/17/2024 10:51 AM  Sarah [name]  has presented today for surgery, with the diagnosis of weight loss.  The various methods of treatment have been discussed with the patient and family. After consideration of risks, benefits and other options for treatment, the patient has consented to  Procedure(s): COLONOSCOPY (N/A) EGD (ESOPHAGOGASTRODUODENOSCOPY) (N/A) as a surgical intervention.  The patient's history has been reviewed, patient examined, no change in status, stable for surgery.  I have reviewed the patient's chart and labs.  Questions were answered to the patient's satisfaction.     Charan Prieto

## 2024-06-18 LAB — CBC
HCT: 30.9 % — ABNORMAL LOW (ref 36.0–46.0)
Hemoglobin: 10.2 g/dL — ABNORMAL LOW (ref 12.0–15.0)
MCH: 28.8 pg (ref 26.0–34.0)
MCHC: 33 g/dL (ref 30.0–36.0)
MCV: 87.3 fL (ref 80.0–100.0)
Platelets: 270 K/uL (ref 150–400)
RBC: 3.54 MIL/uL — ABNORMAL LOW (ref 3.87–5.11)
RDW: 16.3 % — ABNORMAL HIGH (ref 11.5–15.5)
WBC: 6.5 K/uL (ref 4.0–10.5)
nRBC: 0 % (ref 0.0–0.2)

## 2024-06-18 LAB — RENAL FUNCTION PANEL
Albumin: 2.7 g/dL — ABNORMAL LOW (ref 3.5–5.0)
Anion gap: 9 (ref 5–15)
BUN: 7 mg/dL — ABNORMAL LOW (ref 8–23)
CO2: 26 mmol/L (ref 22–32)
Calcium: 8.3 mg/dL — ABNORMAL LOW (ref 8.9–10.3)
Chloride: 104 mmol/L (ref 98–111)
Creatinine, Ser: 1.01 mg/dL — ABNORMAL HIGH (ref 0.44–1.00)
GFR, Estimated: 57 mL/min — ABNORMAL LOW (ref >60)
Glucose, Bld: 95 mg/dL (ref 70–99)
Phosphorus: 3.4 mg/dL (ref 2.5–4.6)
Potassium: 3.7 mmol/L (ref 3.5–5.1)
Sodium: 139 mmol/L (ref 135–145)

## 2024-06-18 LAB — MAGNESIUM: Magnesium: 2.2 mg/dL (ref 1.7–2.4)

## 2024-06-18 NOTE — Progress Notes (Addendum)
 HD#4 SUBJECTIVE:  Patient Summary: Sarah [name] is a 78 y.o. with a pertinent PMH of hypertension, hyperlipidemia, chronic poor oral intake, and progressive unintentional weight loss, who presented with severe hypokalemia (K 2.2) and was admitted for electrolyte derangements and evaluation of weight loss.    Overnight Events: No acute event overnight.  Interim History:  The patient was examined at the bedside with one daughter present in the room and another participating by phone. EGD and colonoscopy performed yesterday were unremarkable; two polyps were removed. The family was reassured that these are likely benign, with final pathology results expected in approximately 2-3 weeks.   OBJECTIVE:  Vital Signs: Vitals:   06/17/24 2014 06/18/24 0006 06/18/24 0453 06/18/24 0925  BP: 125/81 119/63 124/72 112/67  Pulse: 88 76 81 74  Resp: 20 20 20 18   Temp: 98.9 F (37.2 C) 98.2 F (36.8 C) 98.7 F (37.1 C) 98.4 F (36.9 C)  TempSrc: Oral Oral Oral Oral  SpO2: 100% 99% 98% 99%  Weight:      Height:       Supplemental O2: Room Air SpO2: 99 % O2 Flow Rate (L/min): 2 L/min  Filed Weights   06/16/24 0333 06/17/24 1137  Weight: 78 kg 78 kg     Intake/Output Summary (Last 24 hours) at 06/18/2024 1032 Last data filed at 06/17/2024 1523 Gross per 24 hour  Intake 802.33 ml  Output --  Net 802.33 ml   Net IO Since Admission: 2,358.77 mL [06/18/24 1032]  Physical Exam: Physical Exam General: Tired-appearing female, nervous,  no acute distress. HEENT: Dry mucous membranes. CV: RRR, no murmurs. Resp: CTAB, normal work of breathing. Abd: Soft, non-tender, non-distended. Ext: No edema. Skin: Warm, dry, decreased turgor. Neuro: Alert and oriented 3.physical muscle strength: distal lower ext and upper ext: 5/5 Psych: Depressed affect.  Patient Lines/Drains/Airways Status     Active Line/Drains/Airways     Name Placement date Placement time Site Days   Peripheral IV  06/15/24 20 G 1 Left Antecubital 06/15/24  1226  Antecubital  3            Pertinent labs and imaging:      Latest Ref Rng & Units 06/18/2024    6:02 AM 06/17/2024    5:29 AM 06/16/2024    4:39 AM  CBC  WBC 4.0 - 10.5 K/uL 6.5  6.7  7.9   Hemoglobin 12.0 - 15.0 g/dL 89.7  88.4  89.0   Hematocrit 36.0 - 46.0 % 30.9  34.2  31.9   Platelets 150 - 400 K/uL 270  313  275        Latest Ref Rng & Units 06/18/2024    6:02 AM 06/17/2024    9:21 PM 06/17/2024    5:29 AM  CMP  Glucose 70 - 99 mg/dL 95  859  87   BUN 8 - 23 mg/dL 7  5  6    Creatinine 0.44 - 1.00 mg/dL 8.98  8.87  8.75   Sodium 135 - 145 mmol/L 139  141  139   Potassium 3.5 - 5.1 mmol/L 3.7  3.3  3.3   Chloride 98 - 111 mmol/L 104  103  95   CO2 22 - 32 mmol/L 26  26  30    Calcium  8.9 - 10.3 mg/dL 8.3  8.6  8.5   Total Protein 6.5 - 8.1 g/dL   6.9   Total Bilirubin 0.0 - 1.2 mg/dL   0.6   Alkaline Phos 38 -  126 U/L   71   AST 15 - 41 U/L   87   ALT 0 - 44 U/L   37     No results found.   ASSESSMENT/PLAN:  Assessment: Principal Problem:   Hypokalemia Active Problems:   Severe recurrent major depression (HCC)   Unintentional weight loss   Lesion of tail pancreas   Protein-calorie malnutrition, severe   Hypophosphatemia   Acute renal injury due to hypovolemia   Frailty syndrome in geriatric patient   Mass of pancreas  Sarah [name] is a 78 y.o. with a pertinent PMH of hypertension, hyperlipidemia, chronic poor oral intake, and progressive unintentional weight loss, who presented with severe hypokalemia (K 2.2) and was admitted for electrolyte derangements and evaluation of weight loss.  Small pancreatic mass in the tail suspicious for endocrine tumor, follow-up EGD and colonoscopy have been normal.  Plan:  #Hypokalemia- improving (K 3.7 ? 3.3) Recurrent hypokalemia requiring daily supplementation with po tablets.Etiology of hypokalemia likely multifactorial, poor p.o. intake vs ? Renal loss of K. Her  loose stools has resolved, so I doubt that is contributing.  Today, her BMP has normal K and magnesium.  Will check urine electrolytes to rule out renal loss of K.  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 Chromogranin A was mildly elevated, though the significance is unclear given the high rate of false positives. I suspect her >30 lbs weight loss is secondary to poor oral intake and less likely related to the pancreatic tail lesion at this time.  - Gastroenterology recommends surveillance imaging with MRCP in 3-6 months, as the lesion is currently too small for EUS/FNA. - EGD with mild gastritis; started pantoprazole  40 mg per GI recs.  #Severe protein-calorie malnutrition with significant weight loss She has experienced >30 lb weight loss over one month, profound anorexia, reduced oral intake, and muscle wasting, meeting criteria for severe malnutrition. This is contributing to electrolyte instability, fatigue, and functional decline.  Plan: - Dietitian following; high-calorie supplementation - Encourage PO intake; calorie counts - Daily weights - Multivitamin + thiamine  - Monitor electrolytes closely (refeeding risk)  #Dyspnea on exertion Echocardiogram demonstrates normal systolic and diastolic function without structural abnormalities. Her dyspnea on exertion is most likely secondary to deconditioning related to malnutrition and overall poor functional status. There is no evidence of fluid overload or ischemia. The patient initially worked with PT/OT, who recommended SNF placement.  Given improvement in her hypokalemia, we will re-engage PT/OT for repeat evaluation. Plan: - Continue telemetry - PT/OT for graded mobility  #Acute kidney injury, resolved. AKI has resolved, SCr  today 1.01. Plan: - Encourage PO fluids - Avoid NSAIDs and nephrotoxins - Daily BMP  #Hypertension Normotensive today, continue to encourage good po intake.  #Hyperlipidemia Stable chronic condition. Plan: - Continue rosuvastatin     Best Practice: Diet: Regular diet PCQ:Wnwz VTE: Place and maintain sequential compression device Start: 06/17/24 1706 enoxaparin  (LOVENOX ) injection 40 mg Start: 06/15/24 1445 Code: Full  Disposition planning: Therapy Recs: None, DME: none Family Contact: , at bedside. DISPO: Anticipated discharge in TBD days to Skilled nursing facility   Signature:  Missy Celestina [facility]  10:32 AM, 06/18/2024  On Call pager [phone]

## 2024-06-18 NOTE — Plan of Care (Signed)
  Problem: Education: Goal: Knowledge of General Education information will improve Description: Including pain rating scale, medication(s)/side effects and non-pharmacologic comfort measures Outcome: Progressing   Problem: Health Behavior/Discharge Planning: Goal: Ability to manage health-related needs will improve Outcome: Progressing   Problem: Clinical Measurements: Goal: Ability to maintain clinical measurements within normal limits will improve Outcome: Progressing Goal: Will remain free from infection Outcome: Progressing Goal: Diagnostic test results will improve Outcome: Progressing Goal: Respiratory complications will improve Outcome: Progressing Goal: Cardiovascular complication will be avoided Outcome: Progressing   Problem: Activity: Goal: Risk for activity intolerance will decrease Outcome: Progressing   Problem: Nutrition: Goal: Adequate nutrition will be maintained Outcome: Progressing   Problem: Coping: Goal: Level of anxiety will decrease Outcome: Progressing   Problem: Elimination: Goal: Will not experience complications related to bowel motility Outcome: Progressing Goal: Will not experience complications related to urinary retention Outcome: Progressing   Problem: Pain Managment: Goal: General experience of comfort will improve and/or be controlled Outcome: Progressing   Problem: Safety: Goal: Ability to remain free from injury will improve Outcome: Progressing   Problem: Skin Integrity: Goal: Risk for impaired skin integrity will decrease Outcome: Progressing   Problem: Nutrition Goal: Nutritional status is improving Description: Monitor and assess patient for malnutrition (ex- brittle hair, bruises, dry skin, pale skin and conjunctiva, muscle wasting, smooth red tongue, and disorientation). Collaborate with interdisciplinary team and initiate plan and interventions as ordered.  Monitor patient's weight and dietary intake as ordered or per  policy. Utilize nutrition screening tool and intervene per policy. Determine patient's food preferences and provide high-protein, high-caloric foods as appropriate.  Outcome: Progressing

## 2024-06-18 NOTE — Plan of Care (Signed)
  Problem: Education: Goal: Knowledge of General Education information will improve Description: Including pain rating scale, medication(s)/side effects and non-pharmacologic comfort measures Outcome: Progressing   Problem: Clinical Measurements: Goal: Will remain free from infection Outcome: Progressing Goal: Diagnostic test results will improve Outcome: Progressing Goal: Cardiovascular complication will be avoided Outcome: Progressing   Problem: Activity: Goal: Risk for activity intolerance will decrease Outcome: Progressing   Problem: Nutrition: Goal: Adequate nutrition will be maintained Outcome: Progressing   Problem: Coping: Goal: Level of anxiety will decrease Outcome: Progressing   Problem: Safety: Goal: Ability to remain free from injury will improve Outcome: Progressing   Problem: Nutrition Goal: Nutritional status is improving Description: Monitor and assess patient for malnutrition (ex- brittle hair, bruises, dry skin, pale skin and conjunctiva, muscle wasting, smooth red tongue, and disorientation). Collaborate with interdisciplinary team and initiate plan and interventions as ordered.  Monitor patient's weight and dietary intake as ordered or per policy. Utilize nutrition screening tool and intervene per policy. Determine patient's food preferences and provide high-protein, high-caloric foods as appropriate.  Outcome: Progressing

## 2024-06-19 LAB — URINALYSIS, ROUTINE W REFLEX MICROSCOPIC
Bilirubin Urine: NEGATIVE
Glucose, UA: NEGATIVE mg/dL
Ketones, ur: NEGATIVE mg/dL
Nitrite: POSITIVE — AB
Protein, ur: 100 mg/dL — AB
Specific Gravity, Urine: 1.019 (ref 1.005–1.030)
WBC, UA: >50 WBC/hpf (ref 0–5)
pH: 5 (ref 5.0–8.0)

## 2024-06-19 LAB — BASIC METABOLIC PANEL WITH GFR
Anion gap: 9 (ref 5–15)
BUN: 9 mg/dL (ref 8–23)
CO2: 24 mmol/L (ref 22–32)
Calcium: 8.4 mg/dL — ABNORMAL LOW (ref 8.9–10.3)
Chloride: 105 mmol/L (ref 98–111)
Creatinine, Ser: 0.96 mg/dL (ref 0.44–1.00)
GFR, Estimated: >60 mL/min (ref >60)
Glucose, Bld: 104 mg/dL — ABNORMAL HIGH (ref 70–99)
Potassium: 3.6 mmol/L (ref 3.5–5.1)
Sodium: 138 mmol/L (ref 135–145)

## 2024-06-19 LAB — CHLORIDE, URINE, RANDOM: Chloride Urine: 95 mmol/L

## 2024-06-19 LAB — NA AND K (SODIUM & POTASSIUM), RAND UR
Potassium Urine: 72 mmol/L
Sodium, Ur: 73 mmol/L

## 2024-06-19 LAB — MAGNESIUM: Magnesium: 2 mg/dL (ref 1.7–2.4)

## 2024-06-19 LAB — CREATININE, URINE, RANDOM: Creatinine, Urine: 126 mg/dL

## 2024-06-19 MED ORDER — POTASSIUM CHLORIDE CRYS ER 20 MEQ PO TBCR
40.0000 meq | EXTENDED_RELEASE_TABLET | Freq: Once | ORAL | Status: AC
  Administered 2024-06-19: 40 meq via ORAL
  Filled 2024-06-19: qty 2

## 2024-06-19 NOTE — NC FL2 (Signed)
 [place]  MEDICAID FL2 LEVEL OF CARE FORM     IDENTIFICATION  Patient Name: Sarah [name] Birthdate: Apr 10, 1946 Sex: female Admission Date (Current Location): 06/15/2024  [facility] and [place] Number:  Producer, Television/film/video and Address:  [facility] [place], [address] [place], [place] [address]      Provider Number: [id number]  Attending Physician Name and Address:  Rosan Dayton BROCKS, DO  Relative Name and Phone Number:       Current Level of Care: Hospital Recommended Level of Care: Skilled Nursing Facility Prior Approval Number:    Date Approved/Denied:   PASRR Number: [id number]  Discharge Plan: SNF    Current Diagnoses: Patient Active Problem List   Diagnosis Date Noted   Protein-calorie malnutrition, severe 06/16/2024   Hypophosphatemia 06/16/2024   Acute renal injury due to hypovolemia 06/16/2024   Frailty syndrome in geriatric patient 06/16/2024   Mass of pancreas 06/16/2024   Lesion of tail pancreas 06/14/2024   Hypokalemia 06/14/2024   Bilateral thigh pain 05/16/2024   Unintentional weight loss 05/16/2024   Stage 3a chronic kidney disease (HCC) 01/26/2024   Mass of upper outer quadrant of right breast 10/14/2023   History of prediabetes 07/28/2023   Other skin changes 03/03/2023   PAC (premature atrial contraction) 02/10/2023   Chronic pain of both knees 11/25/2022   Tendinosis of left rotator cuff 09/15/2022   Cholelithiases 05/31/2020   Uterine fibroid 05/31/2020   OSA (obstructive sleep apnea) 06/15/2018   Other fatigue 02/17/2017   Severe recurrent major depression (HCC) 05/07/2016   Vitamin D  deficiency 09/18/2015   Health care maintenance 07/09/2012   Hyperlipidemia 06/16/2011    Orientation RESPIRATION BLADDER Height & Weight     Self, Time, Situation, Place  Normal Continent Weight: 171 lb 15.3 oz (78 kg) Height:  5' 4 (162.6 cm)  BEHAVIORAL SYMPTOMS/MOOD NEUROLOGICAL BOWEL NUTRITION STATUS      Continent Diet  (regular)  AMBULATORY STATUS COMMUNICATION OF NEEDS Skin   Limited Assist Verbally Normal                       Personal Care Assistance Level of Assistance  Bathing, Feeding, Dressing Bathing Assistance: Limited assistance Feeding assistance: Independent Dressing Assistance: Limited assistance     Functional Limitations Info             SPECIAL CARE FACTORS FREQUENCY  PT (By licensed PT), OT (By licensed OT)     PT Frequency: 5x/wk OT Frequency: 5x/wk            Contractures Contractures Info: Not present    Additional Factors Info  Code Status, Allergies Code Status Info: Full Allergies Info: Strawberry Extract           Current Medications (06/19/2024):  This is the current hospital active medication list Current Facility-Administered Medications  Medication Dose Route Frequency Provider Last Rate Last Admin   enoxaparin  (LOVENOX ) injection 40 mg  40 mg Subcutaneous Q24H Nooruddin, Saad, MD   40 mg at 06/18/24 1712   feeding supplement (ENSURE PLUS HIGH PROTEIN) liquid 237 mL  237 mL Oral BID BM Trudy Mliss Dragon, MD   237 mL at 06/19/24 9095   hydrOXYzine  (ATARAX ) tablet 10 mg  10 mg Oral TID PRN King, Olivia, DO       lactated ringers  bolus 1,000 mL  1,000 mL Intravenous Once Nooruddin, Saad, MD       multivitamin with minerals tablet 1 tablet  1 tablet  Oral Daily Trudy Mliss Dragon, MD   1 tablet at 06/19/24 9095   pantoprazole  (PROTONIX ) EC tablet 40 mg  40 mg Oral Daily Koomson, Julius, MD   40 mg at 06/19/24 9095   thiamine  (VITAMIN B1) tablet 100 mg  100 mg Oral Daily Trudy Mliss Dragon, MD   100 mg at 06/19/24 9095     Discharge Medications: Please see discharge summary for a list of discharge medications.  Relevant Imaging Results:  Relevant Lab Results:   Additional Information SS#: [id number]  Almarie CHRISTELLA Goodie, LCSW

## 2024-06-19 NOTE — Progress Notes (Signed)
 Occupational Therapy Treatment Patient Details Name: Sarah [name] MRN: [id number] DOB: October 26, 1945 Today's Date: 06/19/2024   History of present illness Pt is a 78 y/o female presenting from PCP office recommendation due to hypokalemia. Pt also with poor PO intake and progressive weakness. PMH: HTN, HLD, and anxiety.   OT comments  Pt seen for OT tx this PM, pt agreeable to participate. Pt progressing well towards functional goals today - AMPAC 18/24 indicating reduced functional performance. Continues to be limited by reduced activity tolerance and generalized weakness. She was overall min A for transfers and CGA for short distance ambulation via RW. Tolerated simple grooming sinkside, leaning on forearms as she fatigued. Agreeable to sit in chair, receptive to OT education and encouragement. Moderately fatigued at end of session, OT to continue to follow.      If plan is discharge home, recommend the following:  A little help with bathing/dressing/bathroom;Assistance with cooking/housework;Help with stairs or ramp for entrance   Equipment Recommendations  Tub/shower seat;BSC/3in1    Recommendations for Other Services      Precautions / Restrictions Precautions Precautions: Fall Recall of Precautions/Restrictions: Impaired Restrictions Weight Bearing Restrictions Per Provider Order: No       Mobility Bed Mobility Overal bed mobility: Needs Assistance Bed Mobility: Supine to Sit     Supine to sit: Contact guard, HOB elevated, Used rails     General bed mobility comments: exited to the L    Transfers Overall transfer level: Needs assistance Equipment used: Rolling walker (2 wheels) Transfers: Sit to/from Stand Sit to Stand: Min assist           General transfer comment: Stood from bed height, VC for hand placement, assist to fully come to upright. VC for forward gaze/upright posture.     Balance Overall balance assessment: Needs assistance Sitting-balance  support: No upper extremity supported, Feet supported Sitting balance-Leahy Scale: Good Sitting balance - Comments: seated EOB   Standing balance support: Bilateral upper extremity supported, During functional activity, No upper extremity supported Standing balance-Leahy Scale: Poor Standing balance comment: reliant on RW                           ADL either performed or assessed with clinical judgement   ADL Overall ADL's : Needs assistance/impaired Eating/Feeding: Independent   Grooming: Contact guard assist;Standing;Wash/dry hands;Wash/dry face Grooming Details (indicate cue type and reason): sinkside; leans B forearms on sink d/t fatigue             Lower Body Dressing: Minimal assistance;Sitting/lateral leans Lower Body Dressing Details (indicate cue type and reason): adjusting B socks Toilet Transfer: Minimal assistance;Ambulation;Regular Toilet;Rolling walker (2 wheels);Grab bars Toilet Transfer Details (indicate cue type and reason): assist to power up from lower surface height, reliant on GBs         Functional mobility during ADLs: Contact guard assist;Rolling walker (2 wheels)      Extremity/Trunk Assessment Upper Extremity Assessment Upper Extremity Assessment: Generalized weakness            Vision       Perception     Praxis     Communication Communication Communication: No apparent difficulties   Cognition Arousal: Alert Behavior During Therapy: WFL for tasks assessed/performed Cognition: No apparent impairments             OT - Cognition Comments: pt very receptive to OT education and encouragement  Following commands: Intact        Cueing   Cueing Techniques: Verbal cues  Exercises      Shoulder Instructions       General Comments VSS, tolerated well; supportive family member present at beginning of session    Pertinent Vitals/ Pain       Pain Assessment Pain Assessment: No/denies  pain  Home Living                                          Prior Functioning/Environment              Frequency  Min 2X/week        Progress Toward Goals  OT Goals(current goals can now be found in the care plan section)  Progress towards OT goals: Progressing toward goals     Plan      Co-evaluation                 AM-PAC OT 6 Clicks Daily Activity     Outcome Measure   Help from another person eating meals?: None Help from another person taking care of personal grooming?: A Little Help from another person toileting, which includes using toliet, bedpan, or urinal?: A Little Help from another person bathing (including washing, rinsing, drying)?: A Lot Help from another person to put on and taking off regular upper body clothing?: A Little Help from another person to put on and taking off regular lower body clothing?: A Little 6 Click Score: 18    End of Session Equipment Utilized During Treatment: Gait belt;Rolling walker (2 wheels)  OT Visit Diagnosis: Unsteadiness on feet (R26.81);Other abnormalities of gait and mobility (R26.89);Muscle weakness (generalized) (M62.81)   Activity Tolerance Patient tolerated treatment well;Patient limited by fatigue   Patient Left in chair;with call bell/phone within reach;with chair alarm set   Nurse Communication Mobility status        Time: 8360-8340 OT Time Calculation (min): 20 min  Charges: OT General Charges $OT Visit: 1 Visit OT Treatments $Self Care/Home Management : 8-22 mins  Vitaliy Eisenhour M. [place], OTR/L [facility] Acute Rehabilitation Services [phone] Secure Chat Preferred  Reeya Bound 06/19/2024, 5:38 PM

## 2024-06-19 NOTE — Plan of Care (Signed)
  Problem: Education: Goal: Knowledge of General Education information will improve Description: Including pain rating scale, medication(s)/side effects and non-pharmacologic comfort measures Outcome: Progressing   Problem: Health Behavior/Discharge Planning: Goal: Ability to manage health-related needs will improve Outcome: Progressing   Problem: Clinical Measurements: Goal: Ability to maintain clinical measurements within normal limits will improve Outcome: Progressing Goal: Will remain free from infection Outcome: Progressing Goal: Diagnostic test results will improve Outcome: Progressing Goal: Respiratory complications will improve Outcome: Progressing Goal: Cardiovascular complication will be avoided Outcome: Progressing   Problem: Activity: Goal: Risk for activity intolerance will decrease Outcome: Progressing   Problem: Nutrition: Goal: Adequate nutrition will be maintained Outcome: Progressing   Problem: Coping: Goal: Level of anxiety will decrease Outcome: Progressing   Problem: Elimination: Goal: Will not experience complications related to bowel motility Outcome: Progressing Goal: Will not experience complications related to urinary retention Outcome: Progressing   Problem: Pain Managment: Goal: General experience of comfort will improve and/or be controlled Outcome: Progressing   Problem: Safety: Goal: Ability to remain free from injury will improve Outcome: Progressing   Problem: Skin Integrity: Goal: Risk for impaired skin integrity will decrease Outcome: Progressing   Problem: Nutrition Goal: Nutritional status is improving Description: Monitor and assess patient for malnutrition (ex- brittle hair, bruises, dry skin, pale skin and conjunctiva, muscle wasting, smooth red tongue, and disorientation). Collaborate with interdisciplinary team and initiate plan and interventions as ordered.  Monitor patient's weight and dietary intake as ordered or per  policy. Utilize nutrition screening tool and intervene per policy. Determine patient's food preferences and provide high-protein, high-caloric foods as appropriate.  Outcome: Progressing

## 2024-06-19 NOTE — Progress Notes (Signed)
 HD#0 SUBJECTIVE:  Patient Summary: Sarah [name] is a 78 year old woman with HTN previously on HCTZ, CKD III, severe malnutrition, and >30 lb weight loss who presented with profound hypokalemia and was found to have a small pancreatic tail lesion suspicious for a NET, now improving after electrolyte repletion with reassuring EGD/colonoscopy and plans for SNF placement.  Overnight Events: NAEON  Interim History:Patient was resting comfortably in bed in no acute distress. She denied chest pain, shortness of breath, nausea, vomiting, or diarrhea. She reports feeling generally tired but without any new or worsening symptoms this morning. Appetite remains limited but she is trying to eat more. No dizziness, abdominal pain, or palpitations. She feels overall stable.  OBJECTIVE:  Vital Signs: Vitals:   06/18/24 1952 06/18/24 2346 06/19/24 0347 06/19/24 0749  BP: (!) 106/58 115/60 120/63 127/60  Pulse: 83 75 78 81  Resp:      Temp: 98.5 F (36.9 C) 98.3 F (36.8 C) 98.1 F (36.7 C) 98.2 F (36.8 C)  TempSrc:    Oral  SpO2: 100% 100% 99% 100%  Weight:      Height:       Supplemental O2: Room Air SpO2: 100 % O2 Flow Rate (L/min): 2 L/min  Filed Weights   06/16/24 0333 06/17/24 1137  Weight: 78 kg 78 kg    No intake or output data in the 24 hours ending 06/19/24 1036 Net IO Since Admission: 2,358.77 mL [06/19/24 1036]  Physical Exam: Physical Exam General: Tired-appearing female, no acute distress. HEENT: Moist mucous membranes. CV: RRR, no murmurs. Resp: CTAB, normal work of breathing. Abd: Soft, non-tender, non-distended. Ext: No edema. Skin: Warm, dry, decreased turgor. Neuro: Alert and oriented 3.physical muscle strength: distal lower ext and upper ext: 5/5 Patient Lines/Drains/Airways Status     Active Line/Drains/Airways     Name Placement date Placement time Site Days   Peripheral IV 06/15/24 20 G 1 Left Antecubital 06/15/24  1226  Antecubital  4             Pertinent labs and imaging:      Latest Ref Rng & Units 06/18/2024    6:02 AM 06/17/2024    5:29 AM 06/16/2024    4:39 AM  CBC  WBC 4.0 - 10.5 K/uL 6.5  6.7  7.9   Hemoglobin 12.0 - 15.0 g/dL 89.7  88.4  89.0   Hematocrit 36.0 - 46.0 % 30.9  34.2  31.9   Platelets 150 - 400 K/uL 270  313  275        Latest Ref Rng & Units 06/18/2024    6:02 AM 06/17/2024    9:21 PM 06/17/2024    5:29 AM  CMP  Glucose 70 - 99 mg/dL 95  859  87   BUN 8 - 23 mg/dL 7  5  6    Creatinine 0.44 - 1.00 mg/dL 8.98  8.87  8.75   Sodium 135 - 145 mmol/L 139  141  139   Potassium 3.5 - 5.1 mmol/L 3.7  3.3  3.3   Chloride 98 - 111 mmol/L 104  103  95   CO2 22 - 32 mmol/L 26  26  30    Calcium  8.9 - 10.3 mg/dL 8.3  8.6  8.5   Total Protein 6.5 - 8.1 g/dL   6.9   Total Bilirubin 0.0 - 1.2 mg/dL   0.6   Alkaline Phos 38 - 126 U/L   71   AST 15 - 41 U/L  87   ALT 0 - 44 U/L   37     No results found.  ASSESSMENT/PLAN:  Assessment: Principal Problem:   Hypokalemia Active Problems:   Severe recurrent major depression (HCC)   Unintentional weight loss   Lesion of tail pancreas   Protein-calorie malnutrition, severe   Hypophosphatemia   Acute renal injury due to hypovolemia   Frailty syndrome in geriatric patient   Mass of pancreas   Plan: Sarah [name] is a 78 year old woman with HTN previously on HCTZ, CKD III, severe malnutrition, and >30 lb weight loss who presented with profound hypokalemia and was found to have a small pancreatic tail lesion suspicious for a NET, now improving after electrolyte repletion with reassuring EGD/colonoscopy and plans for SNF placement.   #Hypokalemia- improving (K 3.6) Patient presented with severe hypokalemia likely due to chronic poor intake, thiazide diuretic use, and mild hypovolemia. No ongoing losses reported. Electrolytes are trending in the right direction with aggressive repletion, and QTc is improving as K rises. 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 No pancreatic duct dilation or metastatic signs.  She reports a 30-lb unintentional weight loss and decreased appetite but no abdominal pain or GI bleeding.  EGD/colonoscopy-performed 2 days ago which was reassuring, chromogranin was elevated 127 although it is not diagnostic for NET and could be higher due to PPI use as well. Plan: - OP GI consult  #Severe protein-calorie malnutrition with significant weight loss She has experienced >30 lb weight loss over one month, profound anorexia, reduced oral intake, and muscle wasting, meeting criteria for severe malnutrition. This is contributing to electrolyte instability, fatigue, and functional decline. Likely multifactorial, with suspected pancreatic NET playing a role. Plan: - Dietitian following; high-calorie supplementation - Encourage PO intake; calorie counts - Daily weights - Multivitamin + thiamine  - Monitor electrolytes closely (refeeding risk)  #Frailty & deconditioning with DOE DOE likely from deconditioning; echo normal. Initial PT/OT recommended SNF after reevaluation when K is normalized. Plan: - PT/OT for graded mobility  #Acute kidney injury - improving (Cr 0.9) Likely pre-renal from volume depletion and poor intake. Responsive to fluid resuscitation. No evidence of obstruction or intrinsic renal disease. Plan: - Encourage PO fluids - Avoid NSAIDs and nephrotoxins - Daily BMP  #Prolonged  QTc Secondary to hypokalemia and several medications. No arrhythmias noted on telemetry. QT should normalize with correction of electrolytes and holding serotonergic/QT-active agents. Plan: - Maintain K > 4 and Mg > 2 - Continue to hold mirtazapine  - Caution with sertraline  (continue for now)  #Hypertension Currently normotensive and occasionally soft, likely due to poor intake and dehydration. No need for antihypertensives during admission. Plan: -Discontinue HCTZ permanently -Hold amlodipine  - Reassess BP outpatient after recovery  #Hyperlipidemia Stable chronic condition. Plan: - Continue rosuvastatin    Best Practice: Diet: Regular diet IVF: NOne VTE: Lovenox  Code: Full  Disposition planning: Therapy Recs: SNF, DME: walker Family Contact: , called and notified. DISPO: Anticipated discharge in TBD days to Skilled nursing facility  Signature:  Irie Fiorello  [facility]  10:36 AM, 06/19/2024  On Call pager [phone]

## 2024-06-19 NOTE — TOC Initial Note (Signed)
 Transition of Care [facility]) - Initial/Assessment Note    Patient Details  Name: Sarah [name] MRN: [id number] Date of Birth: [birth year]  [facility]) CM/SW Contact:    Almarie CHRISTELLA Goodie, LCSW Phone Number: 06/19/2024, 9:30 AM  Clinical Narrative:     Patient from home alone, current recommendations for SNF placement, but PT/OT to reevaluate today due to medical improvements. Patient in agreement with SNF, does not want [facility] [employer]. CSW completed referral and faxed out, awaiting offers. CSW to follow.              Expected Discharge Plan: Skilled Nursing Facility Barriers to Discharge: Insurance Authorization, Continued Medical Work up   Patient Goals and CMS Choice Patient states their goals for this hospitalization and ongoing recovery are:: to get better CMS Medicare.gov Compare Post Acute Care list provided to:: Patient Choice offered to / list presented to : Patient [place] ownership interest in [facility].provided to:: Patient    Expected Discharge Plan and Services     Post Acute Care Choice: Skilled Nursing Facility Living arrangements for the past 2 months: Single Family Home                                      Prior Living Arrangements/Services Living arrangements for the past 2 months: Single Family Home Lives with:: Self Patient language and need for interpreter reviewed:: No Do you feel safe going back to the place where you live?: Yes      Need for Family Participation in Patient Care: No (Comment) Care giver support system in place?: No (comment)   Criminal Activity/Legal Involvement Pertinent to Current Situation/Hospitalization: No - Comment as needed  Activities of Daily Living   ADL Screening (condition at time of admission) Independently performs ADLs?: Yes (appropriate for developmental age) Is the patient deaf or have difficulty hearing?: No Does the patient have difficulty seeing, even when wearing  glasses/contacts?: No Does the patient have difficulty concentrating, remembering, or making decisions?: No  Permission Sought/Granted Permission sought to share information with : Facility Industrial/product Designer granted to share information with : Yes, Verbal Permission Granted     Permission granted to share info w AGENCY: SNF        Emotional Assessment Appearance:: Appears stated age Attitude/Demeanor/Rapport: Engaged Affect (typically observed): Appropriate Orientation: : Oriented to Self, Oriented to Place, Oriented to  Time, Oriented to Situation Alcohol / Substance Use: Not Applicable Psych Involvement: No (comment)  Admission diagnosis:  Hypokalemia [E87.6] Other fatigue [R53.83] Patient Active Problem List   Diagnosis Date Noted   Protein-calorie malnutrition, severe 06/16/2024   Hypophosphatemia 06/16/2024   Acute renal injury due to hypovolemia 06/16/2024   Frailty syndrome in geriatric patient 06/16/2024   Mass of pancreas 06/16/2024   Lesion of tail pancreas 06/14/2024   Hypokalemia 06/14/2024   Bilateral thigh pain 05/16/2024   Unintentional weight loss 05/16/2024   Stage 3a chronic kidney disease (HCC) 01/26/2024   Mass of upper outer quadrant of right breast 10/14/2023   History of prediabetes 07/28/2023   Other skin changes 03/03/2023   PAC (premature atrial contraction) 02/10/2023   Chronic pain of both knees 11/25/2022   Tendinosis of left rotator cuff 09/15/2022   Cholelithiases 05/31/2020   Uterine fibroid 05/31/2020   OSA (obstructive sleep apnea) 06/15/2018   Other fatigue 02/17/2017   Severe recurrent major depression (HCC) 05/07/2016   Vitamin  D deficiency 09/18/2015   Health care maintenance 07/09/2012   Hyperlipidemia 06/16/2011   PCP:  Karna Fellows, MD Pharmacy:   [facility] [phone] - [place], [place] - [address] AT [facility] OF [address] [place] [address] Phone:  [phone] Fax: [phone]  [facility] [address] [place] [address] Phone: [phone] Fax: [phone]  [place] [facility] [address] [place] [address] Phone: [phone] Fax: [phone]     Social Drivers of Health (SDOH) Social History: SDOH Screenings   Food Insecurity: No Food Insecurity (06/15/2024)  Housing: Low Risk  (06/15/2024)  Transportation Needs: No Transportation Needs (06/15/2024)  Utilities: Not At Risk (06/15/2024)  Alcohol Screen: Low Risk  (03/23/2023)  Depression (PHQ2-9): Low Risk  (06/14/2024)  Financial Resource Strain: Low Risk  (10/07/2023)  Physical Activity: Inactive (10/07/2023)  Social Connections: Moderately Integrated (06/15/2024)  Stress: Stress Concern Present (10/07/2023)  Tobacco Use: Low Risk  (06/17/2024)  Health Literacy: Adequate Health Literacy (10/07/2023)   SDOH Interventions:     Readmission Risk Interventions     No data to display

## 2024-06-19 NOTE — Progress Notes (Signed)
 Physical Therapy Treatment Patient Details Name: Sarah [name] MRN: [id number] DOB: February 19, 1946 Today's Date: 06/19/2024   History of Present Illness Pt is a 78 y/o female presenting from PCP office recommendation due to hypokalemia. Pt also with poor PO intake and progressive weakness. PMH: HTN, HLD, and anxiety.    PT Comments  Pt greeted supine in bed, pleasant and agreeable to PT session. She ambulated to/from the bathroom using RW with minA. Pt reported a 9/10 on the modified RPE scale after mobility. She continues to demonstrated decreased hip strength and impaired activity tolerance. Patient will benefit from continued inpatient follow up therapy, <3 hours/day.     If plan is discharge home, recommend the following: A lot of help with walking and/or transfers;A lot of help with bathing/dressing/bathroom;Assistance with cooking/housework;Direct supervision/assist for medications management;Direct supervision/assist for financial management;Assist for transportation;Supervision due to cognitive status;Help with stairs or ramp for entrance   Can travel by private vehicle     Yes  Equipment Recommendations  Rolling walker (2 wheels);BSC/3in1    Recommendations for Other Services       Precautions / Restrictions Precautions Precautions: Fall Recall of Precautions/Restrictions: Impaired Restrictions Weight Bearing Restrictions Per Provider Order: No     Mobility  Bed Mobility Overal bed mobility: Needs Assistance Bed Mobility: Supine to Sit, Sit to Supine     Supine to sit: Supervision, HOB elevated Sit to supine: Contact guard assist, HOB elevated   General bed mobility comments: Pt sat up on L side of bed with increased time and effort. Returning to bed she required slight assist to bring BLE back into bed.    Transfers Overall transfer level: Needs assistance Equipment used: Rolling walker (2 wheels) Transfers: Sit to/from Stand Sit to Stand: Min assist            General transfer comment: Pt stood from lowest bed height and commode. Cued proper hand placement using RW. She opted to push up with BUE support from bed. MinA to power up into stand. Fair eccentric control with sitting. Pt stood from commode pulling on grab bar with one hand. Again requiring minA to power up.    Ambulation/Gait Ambulation/Gait assistance: Min assist Gait Distance (Feet): 10 Feet (x2, seated rest break between bouts) Assistive device: Rolling walker (2 wheels) Gait Pattern/deviations: Step-through pattern, Decreased stride length, Trunk flexed       General Gait Details: Pt ambulated with short steps. She demonstrated fwd lean over RW. Cued upright posture and close. Pt with motivated movements d/t urgency to use the bathroom. Heavy reliance on BUE support on RW.   Stairs             Wheelchair Mobility     Tilt Bed    Modified Rankin (Stroke Patients Only)       Balance Overall balance assessment: Needs assistance Sitting-balance support: No upper extremity supported, Feet supported Sitting balance-Leahy Scale: Good Sitting balance - Comments: Pt performed pericare in bathroom.   Standing balance support: Bilateral upper extremity supported, During functional activity, No upper extremity supported Standing balance-Leahy Scale: Poor Standing balance comment: Pt dependent on RW and external support of PT                            Communication Communication Communication: No apparent difficulties  Cognition Arousal: Alert Behavior During Therapy: WFL for tasks assessed/performed   PT - Cognitive impairments: No apparent impairments  PT - Cognition Comments: Pt A,Ox4 Following commands: Intact      Cueing Cueing Techniques: Verbal cues  Exercises      General Comments General comments (skin integrity, edema, etc.): Pt incontent on way to the bathroom. Assisted in cleaning her and floor up. Changed  socks/gown. Pt reported a 9/10 on the modified RPE scale following mobility.      Pertinent Vitals/Pain Pain Assessment Pain Assessment: No/denies pain    Home Living                          Prior Function            PT Goals (current goals can now be found in the care plan section) Acute Rehab PT Goals Patient Stated Goal: Go to rehab to get stronger. PT Goal Formulation: With patient/family Time For Goal Achievement: 06/30/24 Potential to Achieve Goals: Fair Progress towards PT goals: Progressing toward goals    Frequency    Min 2X/week      PT Plan      Co-evaluation              AM-PAC PT 6 Clicks Mobility   Outcome Measure  Help needed turning from your back to your side while in a flat bed without using bedrails?: A Little Help needed moving from lying on your back to sitting on the side of a flat bed without using bedrails?: A Little Help needed moving to and from a bed to a chair (including a wheelchair)?: A Little Help needed standing up from a chair using your arms (e.g., wheelchair or bedside chair)?: A Little Help needed to walk in hospital room?: A Little Help needed climbing 3-5 steps with a railing? : A Lot 6 Click Score: 17    End of Session Equipment Utilized During Treatment: Gait belt Activity Tolerance: Patient limited by fatigue Patient left: in bed;with call bell/phone within reach;with bed alarm set Nurse Communication: Mobility status PT Visit Diagnosis: Unsteadiness on feet (R26.81);Other abnormalities of gait and mobility (R26.89);Muscle weakness (generalized) (M62.81);History of falling (Z91.81);Difficulty in walking, not elsewhere classified (R26.2);Adult, failure to thrive (R62.7)     Time: 1130-1150 PT Time Calculation (min) (ACUTE ONLY): 20 min  Charges:    $Therapeutic Activity: 8-22 mins PT General Charges $$ ACUTE PT VISIT: 1 Visit                     Randall SAUNDERS, PT, DPT Acute Rehabilitation  Services Office: [phone] Secure Chat Preferred  Sarah [name] 06/19/2024, 1:19 PM

## 2024-06-20 ENCOUNTER — Encounter (HOSPITAL_COMMUNITY): Payer: Self-pay | Admitting: Gastroenterology

## 2024-06-20 DIAGNOSIS — N390 Urinary tract infection, site not specified: Secondary | ICD-10-CM

## 2024-06-20 DIAGNOSIS — Z79899 Other long term (current) drug therapy: Secondary | ICD-10-CM

## 2024-06-20 DIAGNOSIS — Z634 Disappearance and death of family member: Secondary | ICD-10-CM

## 2024-06-20 DIAGNOSIS — F329 Major depressive disorder, single episode, unspecified: Secondary | ICD-10-CM

## 2024-06-20 LAB — RENAL FUNCTION PANEL
Albumin: 2.7 g/dL — ABNORMAL LOW (ref 3.5–5.0)
Anion gap: 8 (ref 5–15)
BUN: 11 mg/dL (ref 8–23)
CO2: 30 mmol/L (ref 22–32)
Calcium: 8.8 mg/dL — ABNORMAL LOW (ref 8.9–10.3)
Chloride: 103 mmol/L (ref 98–111)
Creatinine, Ser: 1.01 mg/dL — ABNORMAL HIGH (ref 0.44–1.00)
GFR, Estimated: 57 mL/min — ABNORMAL LOW (ref >60)
Glucose, Bld: 101 mg/dL — ABNORMAL HIGH (ref 70–99)
Phosphorus: 2.7 mg/dL (ref 2.5–4.6)
Potassium: 4.4 mmol/L (ref 3.5–5.1)
Sodium: 141 mmol/L (ref 135–145)

## 2024-06-20 LAB — CBC
HCT: 30.6 % — ABNORMAL LOW (ref 36.0–46.0)
Hemoglobin: 10.3 g/dL — ABNORMAL LOW (ref 12.0–15.0)
MCH: 29.4 pg (ref 26.0–34.0)
MCHC: 33.7 g/dL (ref 30.0–36.0)
MCV: 87.4 fL (ref 80.0–100.0)
Platelets: 248 K/uL (ref 150–400)
RBC: 3.5 MIL/uL — ABNORMAL LOW (ref 3.87–5.11)
RDW: 16.6 % — ABNORMAL HIGH (ref 11.5–15.5)
WBC: 7.2 K/uL (ref 4.0–10.5)
nRBC: 0 % (ref 0.0–0.2)

## 2024-06-20 MED ORDER — ROSUVASTATIN CALCIUM 20 MG PO TABS
40.0000 mg | ORAL_TABLET | Freq: Every day | ORAL | Status: DC
  Administered 2024-06-20 – 2024-06-25 (×6): 40 mg via ORAL
  Filled 2024-06-20 (×6): qty 2

## 2024-06-20 MED ORDER — MIRTAZAPINE 15 MG PO TABS
15.0000 mg | ORAL_TABLET | Freq: Every day | ORAL | Status: AC
  Administered 2024-06-20 – 2024-06-24 (×5): 15 mg via ORAL
  Filled 2024-06-20 (×5): qty 1

## 2024-06-20 MED ORDER — SERTRALINE HCL 25 MG PO TABS
150.0000 mg | ORAL_TABLET | Freq: Every day | ORAL | Status: DC
  Administered 2024-06-20 – 2024-06-25 (×6): 150 mg via ORAL
  Filled 2024-06-20 (×6): qty 2

## 2024-06-20 MED ORDER — SERTRALINE HCL 25 MG PO TABS: 50.0000 mg | ORAL_TABLET | Freq: Every day | ORAL | Status: DC

## 2024-06-20 NOTE — Plan of Care (Signed)
  Problem: Coping: Goal: Level of anxiety will decrease Outcome: Progressing   Problem: Pain Managment: Goal: General experience of comfort will improve and/or be controlled Outcome: Progressing   Problem: Skin Integrity: Goal: Risk for impaired skin integrity will decrease Outcome: Progressing

## 2024-06-20 NOTE — Plan of Care (Signed)
  Problem: Education: Goal: Knowledge of General Education information will improve Description: Including pain rating scale, medication(s)/side effects and non-pharmacologic comfort measures Outcome: Progressing   Problem: Health Behavior/Discharge Planning: Goal: Ability to manage health-related needs will improve Outcome: Progressing   Problem: Clinical Measurements: Goal: Ability to maintain clinical measurements within normal limits will improve Outcome: Progressing Goal: Will remain free from infection Outcome: Progressing Goal: Diagnostic test results will improve Outcome: Progressing Goal: Respiratory complications will improve Outcome: Progressing Goal: Cardiovascular complication will be avoided Outcome: Progressing   Problem: Activity: Goal: Risk for activity intolerance will decrease Outcome: Progressing   Problem: Nutrition: Goal: Adequate nutrition will be maintained Outcome: Progressing   Problem: Coping: Goal: Level of anxiety will decrease Outcome: Progressing   Problem: Elimination: Goal: Will not experience complications related to bowel motility Outcome: Progressing Goal: Will not experience complications related to urinary retention Outcome: Progressing   Problem: Pain Managment: Goal: General experience of comfort will improve and/or be controlled Outcome: Progressing   Problem: Safety: Goal: Ability to remain free from injury will improve Outcome: Progressing   Problem: Skin Integrity: Goal: Risk for impaired skin integrity will decrease Outcome: Progressing   Problem: Nutrition Goal: Nutritional status is improving Description: Monitor and assess patient for malnutrition (ex- brittle hair, bruises, dry skin, pale skin and conjunctiva, muscle wasting, smooth red tongue, and disorientation). Collaborate with interdisciplinary team and initiate plan and interventions as ordered.  Monitor patient's weight and dietary intake as ordered or per  policy. Utilize nutrition screening tool and intervene per policy. Determine patient's food preferences and provide high-protein, high-caloric foods as appropriate.  Outcome: Progressing

## 2024-06-20 NOTE — Progress Notes (Signed)
 HD#0 SUBJECTIVE:  Patient Summary: Sarah [name]. Driscoll is a 78 year old woman with HTN previously on HCTZ, CKD III, severe malnutrition, and >30 lb weight loss who presented with profound hypokalemia and was found to have a small pancreatic tail lesion suspicious for a NET, now improving after electrolyte repletion with reassuring EGD/colonoscopy and plans for SNF placement.  Overnight Events: NAEON  Interim History: She was resting in the bed comfortably without any acute distress, she endorsed feeling tired but significantly improved comparing to the first day.  She denied chest pain, shortness of breath, diarrhea.  She also denied any urinary symptoms like dysuria, frequency or abdominal pain.  OBJECTIVE:  Vital Signs: Vitals:   06/19/24 1631 06/19/24 2004 06/20/24 0550 06/20/24 0728  BP: 114/62 (!) 103/54 110/72 (!) 123/54  Pulse: 84 76 65 72  Resp:    18  Temp: 97.8 F (36.6 C) (!) 97.5 F (36.4 C)  (!) 97.5 F (36.4 C)  TempSrc: Oral Oral  Oral  SpO2: 98% 99% 98% 99%  Weight:      Height:       Supplemental O2: Room Air SpO2: 99 % O2 Flow Rate (L/min): 2 L/min  Filed Weights   06/16/24 0333 06/17/24 1137  Weight: 78 kg 78 kg    No intake or output data in the 24 hours ending 06/20/24 0735 Net IO Since Admission: 2,358.77 mL [06/20/24 0735]  Physical Exam: Physical Exam General: Pleasant female, no acute distress. HEENT: Moist mucous membranes. CV: RRR, no murmurs. Resp: CTAB, normal work of breathing. Abd: Soft, non-tender, non-distended. Ext: No edema. Skin: Warm, dry, decreased turgor. Neuro: Alert and oriented 3.physical muscle strength: distal lower ext and upper ext: 5/5   Patient Lines/Drains/Airways Status     Active Line/Drains/Airways     Name Placement date Placement time Site Days   Peripheral IV 06/15/24 20 G 1 Left Antecubital 06/15/24  1226  Antecubital  5            Pertinent labs and imaging:      Latest Ref Rng & Units 06/20/2024     4:45 AM 06/18/2024    6:02 AM 06/17/2024    5:29 AM  CBC  WBC 4.0 - 10.5 K/uL 7.2  6.5  6.7   Hemoglobin 12.0 - 15.0 g/dL 89.6  89.7  88.4   Hematocrit 36.0 - 46.0 % 30.6  30.9  34.2   Platelets 150 - 400 K/uL 248  270  313        Latest Ref Rng & Units 06/19/2024   10:49 AM 06/18/2024    6:02 AM 06/17/2024    9:21 PM  CMP  Glucose 70 - 99 mg/dL 895  95  859   BUN 8 - 23 mg/dL 9  7  5    Creatinine 0.44 - 1.00 mg/dL 9.03  8.98  8.87   Sodium 135 - 145 mmol/L 138  139  141   Potassium 3.5 - 5.1 mmol/L 3.6  3.7  3.3   Chloride 98 - 111 mmol/L 105  104  103   CO2 22 - 32 mmol/L 24  26  26    Calcium  8.9 - 10.3 mg/dL 8.4  8.3  8.6     No results found.  ASSESSMENT/PLAN:  Assessment: Principal Problem:   Hypokalemia Active Problems:   Severe recurrent major depression (HCC)   Unintentional weight loss   Lesion of tail pancreas   Protein-calorie malnutrition, severe   Hypophosphatemia   Acute renal injury due  to hypovolemia   Frailty syndrome in geriatric patient   Mass of pancreas   Plan: Sarah [name] is a 78 year old woman with HTN previously on HCTZ, CKD III, severe malnutrition, and >30 lb weight loss who presented with profound hypokalemia and was found to have a small pancreatic tail lesion suspicious for a NET, now improving after electrolyte repletion with reassuring EGD/colonoscopy and plans for SNF placement.   #Hypokalemia- resolved (K 4.4) Patient presented with severe hypokalemia likely due to chronic poor intake, thiazide diuretic use, and mild hypovolemia. No ongoing losses reported. Electrolytes are trending in the right direction with aggressive repletion, and QTc is improving as K rises. 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 No pancreatic duct dilation or metastatic signs.  She reports a 30-lb unintentional weight loss and decreased appetite but no abdominal pain or GI bleeding.  EGD/colonoscopy-performed 2 days ago which was reassuring, chromogranin was elevated 127 although it is not diagnostic for NET and could be higher due to PPI use as well.  Plan: - OP GI consult  #Severe protein-calorie malnutrition with significant weight loss She has experienced >30 lb weight loss over one month, profound anorexia, reduced oral intake, and muscle wasting, meeting criteria for severe malnutrition. This is contributing to electrolyte instability, fatigue, and functional decline. Likely multifactorial, with suspected pancreatic NET playing a role. Plan: - Dietitian following; high-calorie supplementation - Encourage PO intake; calorie counts - Daily weights - Multivitamin + thiamine  - Monitor electrolytes closely (refeeding risk)  #Frailty & deconditioning with DOE DOE likely from deconditioning; echo normal. Initial PT/OT recommended SNF after reevaluation when K is normalized. Plan: - PT/OT for graded mobility  #Acute kidney injury - improving (Cr 1) Likely pre-renal from volume depletion and poor intake. Responsive to fluid resuscitation. No evidence of obstruction or intrinsic renal disease. Plan: - Encourage PO fluids - Avoid NSAIDs and nephrotoxins - Daily BMP  #Asymptomatic UTI Urinalysis today was positive for leukocyte and nitrate, however patient denied any symptoms like dysuria, frequency, suprapubic pain. Plan: -Follow-up patient for urinary symptoms -No need to start antibiotic for now  #Prolonged  QTc Secondary to hypokalemia and several medications. No arrhythmias noted on telemetry. QT should normalize with correction of electrolytes and holding serotonergic/QT-active agents. Plan: - Maintain K > 4 and Mg > 2 - Continue to hold mirtazapine  - Caution with sertraline  (continue for now)  #Hypertension Currently normotensive and occasionally soft, likely due to poor intake and dehydration. No need for antihypertensives during admission. Plan: -Discontinue HCTZ permanently -Hold amlodipine  - Reassess BP outpatient after recovery  #Hyperlipidemia Stable chronic condition. Plan: - Continue rosuvastatin   #MDD Patient has had history of MDD following her husband and son's passing for which, sertraline  150 mg and mirtazapine  were prescribed for her. Plan: -Resume sertraline  150 mg a.m. daily -Check EKG and QT to resume mirtazapine   Best Practice: Diet: Regular diet IVF: NOne VTE: Lovenox  Code: Full   Disposition planning: Therapy Recs: SNF, DME: walker Family Contact: , called and notified. DISPO: Anticipated discharge  in TBD days to Skilled nursing facility   Best Practice: Diet: Regular diet IVF: None VTE: Place and maintain sequential compression device Start: 06/17/24 1706 enoxaparin  (LOVENOX ) injection 40 mg Start: 06/15/24 1445 Code: Full   Signature:  Caylor Tallarico [facility]  7:35 AM, 06/20/2024  On Call pager [phone]

## 2024-06-21 LAB — RENAL FUNCTION PANEL
Albumin: 2.6 g/dL — ABNORMAL LOW (ref 3.5–5.0)
Anion gap: 12 (ref 5–15)
BUN: 11 mg/dL (ref 8–23)
CO2: 21 mmol/L — ABNORMAL LOW (ref 22–32)
Calcium: 8.5 mg/dL — ABNORMAL LOW (ref 8.9–10.3)
Chloride: 106 mmol/L (ref 98–111)
Creatinine, Ser: 1.13 mg/dL — ABNORMAL HIGH (ref 0.44–1.00)
GFR, Estimated: 50 mL/min — ABNORMAL LOW (ref >60)
Glucose, Bld: 87 mg/dL (ref 70–99)
Phosphorus: 2.7 mg/dL (ref 2.5–4.6)
Potassium: 3.8 mmol/L (ref 3.5–5.1)
Sodium: 139 mmol/L (ref 135–145)

## 2024-06-21 LAB — SURGICAL PATHOLOGY

## 2024-06-21 MED ORDER — ONDANSETRON 4 MG PO TBDP: 4.0000 mg | ORAL_TABLET | Freq: Once | ORAL | Status: DC

## 2024-06-21 MED ORDER — POTASSIUM CHLORIDE CRYS ER 20 MEQ PO TBCR
40.0000 meq | EXTENDED_RELEASE_TABLET | Freq: Once | ORAL | Status: AC
  Administered 2024-06-21: 40 meq via ORAL
  Filled 2024-06-21: qty 2

## 2024-06-21 MED ORDER — ALUM & MAG HYDROXIDE-SIMETH 200-200-20 MG/5ML PO SUSP
30.0000 mL | Freq: Once | ORAL | Status: AC
  Administered 2024-06-21: 30 mL via ORAL
  Filled 2024-06-21: qty 30

## 2024-06-21 NOTE — Progress Notes (Signed)
 HD#0 SUBJECTIVE:  Patient Summary: Sarah [name] is a 78 year old woman with HTN previously on HCTZ, CKD III, severe malnutrition, and >30 lb weight loss who presented with profound hypokalemia and was found to have a small pancreatic tail lesion suspicious for a NET, now improving after electrolyte repletion with reassuring EGD/colonoscopy and plans for SNF placement.   Overnight Events: NAEON  Interim History: She was sitting on the chair comfortably, eating her breakfast with no acute distress.  She denied any pain, shortness of breath or imbalance, she denied any urinary symptoms or problems with bowel movement.  She feels really better than the first day however she has mild fatigue.  She is waiting for insurance to be approved for SNF.  OBJECTIVE:  Vital Signs: Vitals:   06/20/24 1634 06/20/24 2029 06/21/24 0042 06/21/24 0526  BP: 124/74 (!) 116/55 (!) 121/42 (!) 118/47  Pulse: 90 81 70 71  Resp: 18     Temp: 98.7 F (37.1 C) 98.2 F (36.8 C) 98 F (36.7 C) 98.2 F (36.8 C)  TempSrc: Oral     SpO2: 100% 98% 100% 98%  Weight:      Height:       Supplemental O2: Room Air SpO2: 98 % O2 Flow Rate (L/min): 2 L/min  Filed Weights   06/16/24 0333 06/17/24 1137  Weight: 78 kg 78 kg     Intake/Output Summary (Last 24 hours) at 06/21/2024 9297 Last data filed at 06/20/2024 1315 Gross per 24 hour  Intake 247 ml  Output --  Net 247 ml   Net IO Since Admission: 2,605.77 mL [06/21/24 0702]  Physical Exam: Physical Exam General: Pleasant female, no acute distress. HEENT: Moist mucous membranes. CV: RRR, no murmurs. Resp: CTAB, normal work of breathing. Abd: Soft, non-tender, non-distended. Ext: No edema. Skin: Warm, dry, decreased turgor. Neuro: Alert and oriented 3.physical muscle strength: distal lower ext and upper ext: 5/5   Patient Lines/Drains/Airways Status     Active Line/Drains/Airways     Name Placement date Placement time Site Days   Peripheral IV  06/15/24 20 G 1 Left Antecubital 06/15/24  1226  Antecubital  6            Pertinent labs and imaging:      Latest Ref Rng & Units 06/20/2024    4:45 AM 06/18/2024    6:02 AM 06/17/2024    5:29 AM  CBC  WBC 4.0 - 10.5 K/uL 7.2  6.5  6.7   Hemoglobin 12.0 - 15.0 g/dL 89.6  89.7  88.4   Hematocrit 36.0 - 46.0 % 30.6  30.9  34.2   Platelets 150 - 400 K/uL 248  270  313        Latest Ref Rng & Units 06/21/2024    4:44 AM 06/20/2024    8:21 AM 06/19/2024   10:49 AM  CMP  Glucose 70 - 99 mg/dL 87  898  895   BUN 8 - 23 mg/dL 11  11  9    Creatinine 0.44 - 1.00 mg/dL 8.86  8.98  9.03   Sodium 135 - 145 mmol/L 139  141  138   Potassium 3.5 - 5.1 mmol/L 3.8  4.4  3.6   Chloride 98 - 111 mmol/L 106  103  105   CO2 22 - 32 mmol/L 21  30  24    Calcium  8.9 - 10.3 mg/dL 8.5  8.8  8.4     No results found.  ASSESSMENT/PLAN:  Assessment: Principal Problem:  Hypokalemia Active Problems:   Severe recurrent major depression (HCC)   Unintentional weight loss   Lesion of tail pancreas   Protein-calorie malnutrition, severe   Hypophosphatemia   Acute renal injury due to hypovolemia   Frailty syndrome in geriatric patient   Mass of pancreas   Plan: Sarah [name] is a 78 year old woman with HTN previously on HCTZ, CKD III, severe malnutrition, and >30 lb weight loss who presented with profound hypokalemia and was found to have a small pancreatic tail lesion suspicious for a NET, now improving after electrolyte repletion with reassuring EGD/colonoscopy and plans for SNF placement.   #Hypokalemia- resolved (K 3.8) Patient presented with severe hypokalemia likely due to chronic poor intake, thiazide diuretic use, and mild hypovolemia. No ongoing losses reported. Electrolytes are trending in the right direction with aggressive repletion, and QTc is improving as K rises. 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 No pancreatic duct dilation or metastatic signs.  She reports a 30-lb unintentional weight loss and decreased appetite but no abdominal pain or GI bleeding.  EGD/colonoscopy-performed 2 days ago which was reassuring, chromogranin was elevated 127 although it is not diagnostic for NET and could be higher due to PPI use as well.  Plan: - OP GI consult  #Severe protein-calorie malnutrition with significant weight loss She has experienced >30 lb weight loss over one month, profound anorexia, reduced oral intake, and muscle wasting, meeting criteria for severe malnutrition. This is contributing to electrolyte instability, fatigue, and functional decline. Likely multifactorial, with suspected pancreatic NET playing a role. Plan: - Dietitian following; high-calorie supplementation - Encourage PO intake; calorie counts - Daily weights - Multivitamin + thiamine  - Monitor electrolytes closely (refeeding risk)  #Frailty & deconditioning with DOE DOE likely from deconditioning; echo normal. Initial PT/OT recommended SNF after reevaluation when K is normalized. Plan: - PT/OT for graded mobility  #Acute kidney injury - improving (Cr 1) Likely pre-renal from volume depletion and poor intake. Responsive to fluid resuscitation. No evidence of obstruction or intrinsic renal disease. Plan: - Encourage PO fluids - Avoid NSAIDs and nephrotoxins - Daily BMP   #Asymptomatic UTI Urinalysis today was positive for leukocyte and nitrate, however patient denied any  symptoms like dysuria, frequency, suprapubic pain. Plan: -Follow-up patient for urinary symptoms -No need to start antibiotic for now  #Prolonged QTc Secondary to hypokalemia and several medications. No arrhythmias noted on telemetry. QT should normalize with correction of electrolytes and holding serotonergic/QT-active agents. Plan: - Maintain K > 4 and Mg > 2 - Continue to hold mirtazapine  - Caution with sertraline  (continue for now)  #Hypertension Currently normotensive and occasionally soft, likely due to poor intake and dehydration. No need for antihypertensives during admission. Plan: -Discontinue HCTZ permanently -Hold amlodipine  - Reassess BP outpatient after recovery  #Hyperlipidemia Stable chronic condition. Plan: - Continue rosuvastatin    #MDD Patient has had history of MDD following her husband and son's passing for which, sertraline  150 mg and mirtazapine  were prescribed for her. Plan: -Resume sertraline  150 mg a.m. daily -  Check EKG and QT to resume mirtazapine    Best Practice: Diet: Regular diet IVF: NOne VTE: Lovenox  Code: Full   Disposition planning: Therapy Recs: SNF, DME: walker Family Contact: , called and notified. DISPO: Anticipated discharge in TBD days to Skilled nursing facility   Best Practice: Diet: Regular diet IVF: None VTE: Place and maintain sequential compression device Start: 06/17/24 1706 enoxaparin  (LOVENOX ) injection 40 mg Start: 06/15/24 1445 Code: Full    Signature:  Alp Goldwater [facility]  7:02 AM, 06/21/2024  On Call pager [phone]

## 2024-06-21 NOTE — Plan of Care (Signed)

## 2024-06-21 NOTE — Progress Notes (Signed)
 Physical Therapy Treatment Patient Details Name: Sarah [name] MRN: [id number] DOB: 12/03/1945 Today's Date: 06/21/2024   History of Present Illness Pt is a 78 y/o female presenting from PCP office recommendation due to hypokalemia. Pt also with poor PO intake and progressive weakness. PMH: HTN, HLD, and anxiety.    PT Comments  Pt received in supine and agreeable to session. Pt requests to use the bathroom and is able to complete pericare in sitting and hand hygiene with heavy UE support at sink. Pt able to increase gait distance with 1 seated rest break on rollator due to fatigue. Pt maintains flexed posture and fatigues quickly, but no LE buckling noted. Pt instructed in BLE exercises and educated on activity progression to improve endurance. Pt continues to benefit from PT services to progress toward functional mobility goals.    If plan is discharge home, recommend the following: A lot of help with walking and/or transfers;A lot of help with bathing/dressing/bathroom;Assistance with cooking/housework;Direct supervision/assist for medications management;Direct supervision/assist for financial management;Assist for transportation;Supervision due to cognitive status;Help with stairs or ramp for entrance   Can travel by private vehicle     Yes  Equipment Recommendations  Rolling walker (2 wheels);BSC/3in1    Recommendations for Other Services       Precautions / Restrictions Precautions Precautions: Fall Recall of Precautions/Restrictions: Impaired Restrictions Weight Bearing Restrictions Per Provider Order: No     Mobility  Bed Mobility Overal bed mobility: Needs Assistance Bed Mobility: Supine to Sit     Supine to sit: Contact guard, HOB elevated, Used rails     General bed mobility comments: increased time and use of bed features    Transfers Overall transfer level: Needs assistance Equipment used: Rolling walker (2 wheels) Transfers: Sit to/from Stand Sit to Stand:  Contact guard assist           General transfer comment: From EOB, toilet, and rollator with CGA for safety and cues for hand placement each time. Seated rest break due to fatigue    Ambulation/Gait Ambulation/Gait assistance: Contact guard assist Gait Distance (Feet): 10 Feet (+25, +35) Assistive device: Rollator (4 wheels) Gait Pattern/deviations: Step-through pattern, Decreased stride length, Trunk flexed       General Gait Details: Pt maintained flexed posture despite cues. Cues for rollator safety and use of brakes with pt requiring cues to lock brakes during each transfer   Stairs             Wheelchair Mobility     Tilt Bed    Modified Rankin (Stroke Patients Only)       Balance Overall balance assessment: Needs assistance Sitting-balance support: No upper extremity supported, Feet supported Sitting balance-Leahy Scale: Good     Standing balance support: Bilateral upper extremity supported, During functional activity, Reliant on assistive device for balance Standing balance-Leahy Scale: Poor Standing balance comment: reliant on UE support                            Communication Communication Communication: No apparent difficulties  Cognition Arousal: Alert Behavior During Therapy: WFL for tasks assessed/performed   PT - Cognitive impairments: No apparent impairments                         Following commands: Intact      Cueing Cueing Techniques: Verbal cues  Exercises General Exercises - Lower Extremity Long Arc Quad: AROM, Seated, Both, 10 reps Hip  Flexion/Marching: AROM, Seated, Both, 5 reps    General Comments        Pertinent Vitals/Pain Pain Assessment Pain Assessment: No/denies pain     PT Goals (current goals can now be found in the care plan section) Acute Rehab PT Goals Patient Stated Goal: Go to rehab to get stronger. PT Goal Formulation: With patient/family Time For Goal Achievement:  06/30/24 Progress towards PT goals: Progressing toward goals    Frequency    Min 2X/week       AM-PAC PT 6 Clicks Mobility   Outcome Measure  Help needed turning from your back to your side while in a flat bed without using bedrails?: A Little Help needed moving from lying on your back to sitting on the side of a flat bed without using bedrails?: A Little Help needed moving to and from a bed to a chair (including a wheelchair)?: A Little Help needed standing up from a chair using your arms (e.g., wheelchair or bedside chair)?: A Little Help needed to walk in hospital room?: A Little Help needed climbing 3-5 steps with a railing? : A Lot 6 Click Score: 17    End of Session Equipment Utilized During Treatment: Gait belt Activity Tolerance: Patient limited by fatigue;Patient tolerated treatment well Patient left: in chair;with chair alarm set;with call bell/phone within reach Nurse Communication: Mobility status PT Visit Diagnosis: Unsteadiness on feet (R26.81);Other abnormalities of gait and mobility (R26.89);Muscle weakness (generalized) (M62.81);History of falling (Z91.81);Difficulty in walking, not elsewhere classified (R26.2);Adult, failure to thrive (R62.7)     Time: 0825-0851 PT Time Calculation (min) (ACUTE ONLY): 26 min  Charges:    $Gait Training: 8-22 mins $Therapeutic Activity: 8-22 mins PT General Charges $$ ACUTE PT VISIT: 1 Visit                    Darryle George, PTA Acute Rehabilitation Services Secure Chat Preferred  [phone]    Darryle George 06/21/2024, 9:40 AM

## 2024-06-21 NOTE — TOC Progression Note (Signed)
 Transition of Care [facility]) - Progression Note    Patient Details  Name: Sarah [name] MRN: [id number] Date of Birth: Feb 10, 1946  [facility]) CM/SW Contact  Sherline Clack, [place] Phone Number: 06/21/2024, 1:10 PM  Clinical Narrative:     CSW left facility list with Medicare ratings at bedside with patient. Patient requests to review list with daughter before making a decision. CSW called daughter Orlene and she said she is on her way to the hospital and will discuss with patient. CSW will continue to follow and update DC plan.   Expected Discharge Plan: Skilled Nursing Facility Barriers to Discharge: English As A Second Language Teacher, Continued Medical Work up               Expected Discharge Plan and Services     Post Acute Care Choice: Skilled Nursing Facility Living arrangements for the past 2 months: Single Family Home                                       Social Drivers of Health (SDOH) Interventions SDOH Screenings   Food Insecurity: No Food Insecurity (06/15/2024)  Housing: Low Risk  (06/15/2024)  Transportation Needs: No Transportation Needs (06/15/2024)  Utilities: Not At Risk (06/15/2024)  Alcohol Screen: Low Risk  (03/23/2023)  Depression (PHQ2-9): Low Risk  (06/14/2024)  Financial Resource Strain: Low Risk  (10/07/2023)  Physical Activity: Inactive (10/07/2023)  Social Connections: Moderately Integrated (06/15/2024)  Stress: Stress Concern Present (10/07/2023)  Tobacco Use: Low Risk  (06/17/2024)  Health Literacy: Adequate Health Literacy (10/07/2023)    Readmission Risk Interventions     No data to display
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
## 2024-06-21 NOTE — Plan of Care (Signed)

## 2024-06-22 DIAGNOSIS — I10 Essential (primary) hypertension: Secondary | ICD-10-CM | POA: Diagnosis not present

## 2024-06-22 DIAGNOSIS — Z79899 Other long term (current) drug therapy: Secondary | ICD-10-CM | POA: Diagnosis not present

## 2024-06-22 DIAGNOSIS — F329 Major depressive disorder, single episode, unspecified: Secondary | ICD-10-CM | POA: Diagnosis not present

## 2024-06-22 DIAGNOSIS — K8689 Other specified diseases of pancreas: Secondary | ICD-10-CM | POA: Diagnosis not present

## 2024-06-22 DIAGNOSIS — R5381 Other malaise: Secondary | ICD-10-CM | POA: Diagnosis not present

## 2024-06-22 DIAGNOSIS — Z634 Disappearance and death of family member: Secondary | ICD-10-CM | POA: Diagnosis not present

## 2024-06-22 DIAGNOSIS — E876 Hypokalemia: Secondary | ICD-10-CM | POA: Diagnosis not present

## 2024-06-22 DIAGNOSIS — R634 Abnormal weight loss: Secondary | ICD-10-CM | POA: Diagnosis not present

## 2024-06-22 DIAGNOSIS — N390 Urinary tract infection, site not specified: Secondary | ICD-10-CM | POA: Diagnosis not present

## 2024-06-22 DIAGNOSIS — E43 Unspecified severe protein-calorie malnutrition: Secondary | ICD-10-CM | POA: Diagnosis not present

## 2024-06-22 DIAGNOSIS — E785 Hyperlipidemia, unspecified: Secondary | ICD-10-CM | POA: Diagnosis not present

## 2024-06-22 LAB — BASIC METABOLIC PANEL WITH GFR
Anion gap: 5 (ref 5–15)
BUN: 12 mg/dL (ref 8–23)
CO2: 30 mmol/L (ref 22–32)
Calcium: 9 mg/dL (ref 8.9–10.3)
Chloride: 107 mmol/L (ref 98–111)
Creatinine, Ser: 1.37 mg/dL — ABNORMAL HIGH (ref 0.44–1.00)
GFR, Estimated: 40 mL/min — ABNORMAL LOW (ref >60)
Glucose, Bld: 102 mg/dL — ABNORMAL HIGH (ref 70–99)
Potassium: 4.8 mmol/L (ref 3.5–5.1)
Sodium: 142 mmol/L (ref 135–145)

## 2024-06-22 MED ORDER — LACTATED RINGERS IV BOLUS
500.0000 mL | Freq: Once | INTRAVENOUS | Status: AC
  Administered 2024-06-22: 500 mL via INTRAVENOUS

## 2024-06-22 MED ORDER — CEFADROXIL 500 MG PO CAPS: 500.0000 mg | ORAL_CAPSULE | Freq: Two times a day (BID) | ORAL | Status: DC

## 2024-06-22 NOTE — Progress Notes (Signed)
 Occupational Therapy Treatment Patient Details Name: Sarah [name] MRN: [id number] DOB: 04/29/1946 Today's Date: 06/22/2024   History of present illness Pt is a 78 y/o female presenting from PCP office recommendation due to hypokalemia. Pt also with poor PO intake and progressive weakness. PMH: HTN, HLD, and anxiety.   OT comments  Pt seen for OT treatment this AM. Pt reporting some drowsiness but willing to participate. Pt making fair gains towards OT goals today. Functionally, she was min A for transfers and CGA for short ambulation in room with RW. She tolerated standing ADLs at the sink with CGA, leans on forearms as she fatigues. Rated 5/10 exertion via RPE today, indicating reduced activity tolerance. No changes to d/c recs, OT to continue to follow.      If plan is discharge home, recommend the following:  A little help with bathing/dressing/bathroom;Assistance with cooking/housework;Help with stairs or ramp for entrance   Equipment Recommendations  Tub/shower seat;BSC/3in1    Recommendations for Other Services      Precautions / Restrictions Precautions Precautions: Fall Recall of Precautions/Restrictions: Impaired Restrictions Weight Bearing Restrictions Per Provider Order: No       Mobility Bed Mobility Overal bed mobility: Needs Assistance Bed Mobility: Supine to Sit, Sit to Supine     Supine to sit: Contact guard, HOB elevated, Used rails Sit to supine: Contact guard assist, HOB elevated   General bed mobility comments: Exited to the R side, using rails to assist    Transfers Overall transfer level: Needs assistance Equipment used: Rolling walker (2 wheels) Transfers: Sit to/from Stand Sit to Stand: Contact guard assist, Min assist           General transfer comment: incr effort to come to standing position, VC for hand placement     Balance Overall balance assessment: Needs assistance Sitting-balance support: No upper extremity supported, Feet  supported Sitting balance-Leahy Scale: Good Sitting balance - Comments: seated EOB, no LOB observed   Standing balance support: Bilateral upper extremity supported, During functional activity, Reliant on assistive device for balance Standing balance-Leahy Scale: Poor Standing balance comment: increasing reliance on RW as she fatigued                           ADL either performed or assessed with clinical judgement   ADL Overall ADL's : Needs assistance/impaired     Grooming: Contact guard assist;Supervision/safety;Standing;Wash/dry face Grooming Details (indicate cue type and reason): leans on forearms as she fatigues         Upper Body Dressing : Set up;Sitting Upper Body Dressing Details (indicate cue type and reason): incr time/effort to pull UE's through gown arm holes                 Functional mobility during ADLs: Contact guard assist;Rolling walker (2 wheels)      Extremity/Trunk Assessment              Vision       Perception     Praxis     Communication Communication Communication: No apparent difficulties   Cognition Arousal: Alert Behavior During Therapy: WFL for tasks assessed/performed Cognition: No apparent impairments             OT - Cognition Comments: reports some fatigue and drowsiness, overall participatory and pleasant                 Following commands: Intact        Cueing  Cueing Techniques: Verbal cues  Exercises Exercises: Other exercises Other Exercises Other Exercises: general warm up and cool down therapeutic exercises pre and post session - elbow curls, arm punches, deep breathing    Shoulder Instructions       General Comments VSS    Pertinent Vitals/ Pain       Pain Assessment Pain Assessment: No/denies pain  Home Living                                          Prior Functioning/Environment              Frequency  Min 2X/week        Progress Toward  Goals  OT Goals(current goals can now be found in the care plan section)  Progress towards OT goals: Progressing toward goals     Plan      Co-evaluation                 AM-PAC OT 6 Clicks Daily Activity     Outcome Measure   Help from another person eating meals?: None Help from another person taking care of personal grooming?: A Little Help from another person toileting, which includes using toliet, bedpan, or urinal?: A Little Help from another person bathing (including washing, rinsing, drying)?: A Lot Help from another person to put on and taking off regular upper body clothing?: A Little Help from another person to put on and taking off regular lower body clothing?: A Little 6 Click Score: 18    End of Session Equipment Utilized During Treatment: Gait belt;Rolling walker (2 wheels)  OT Visit Diagnosis: Unsteadiness on feet (R26.81);Other abnormalities of gait and mobility (R26.89);Muscle weakness (generalized) (M62.81)   Activity Tolerance Patient tolerated treatment well   Patient Left in bed;with call bell/phone within reach;with bed alarm set   Nurse Communication Mobility status (pt bumped head on bed rail upon returning to supine)        Time: 9071-9047 OT Time Calculation (min): 24 min  Charges: OT General Charges $OT Visit: 1 Visit OT Treatments $Self Care/Home Management : 23-37 mins  Laramie Gelles M. [place], OTR/L [facility] [phone] Secure Chat Preferred  Mirian Casco 06/22/2024, 11:44 AM

## 2024-06-22 NOTE — Progress Notes (Signed)
 HD#7 SUBJECTIVE:  Patient Summary: Sarah [name]. Sarah [name] is a 78 year old woman with HTN previously on HCTZ, CKD III, severe malnutrition, and >30 lb weight loss who presented with profound hypokalemia and was found to have a small pancreatic tail lesion suspicious for a NET, now improving after electrolyte repletion with reassuring EGD/colonoscopy and plans for SNF placement.    Overnigh Events: NAEON  Interim History:  She was resting in bed in no acute distress. She stated that her appetite has improved as she is now able to taste her food. She reports feeling better overall, with improved fatigue. She denies diarrhea, constipation, or urinary symptoms. She also feels mentally clearer and is awaiting transfer to SNF.  OBJECTIVE:  Vital Signs: Vitals:   06/22/24 0531 06/22/24 0815 06/22/24 1214 06/22/24 1624  BP: 132/72 (!) 146/62 (!) 115/57 (!) 139/59  Pulse: 76 69 85 73  Resp:  20 20 20   Temp: 98 F (36.7 C) 97.9 F (36.6 C) 98.4 F (36.9 C) 97.7 F (36.5 C)  TempSrc:  Oral Oral Oral  SpO2: 100% 100% 99% 98%  Weight:      Height:       Supplemental O2: Room Air SpO2: 98 % O2 Flow Rate (L/min): 2 L/min  Filed Weights   06/16/24 0333 06/17/24 1137  Weight: 78 kg 78 kg     Intake/Output Summary (Last 24 hours) at 06/22/2024 1759 Last data filed at 06/22/2024 1711 Gross per 24 hour  Intake 536 ml  Output --  Net 536 ml   Net IO Since Admission: 3,141.77 mL [06/22/24 1759]  Physical Exam: Physical Exam General: Pleasant female, no acute distress. HEENT: Moist mucous membranes. CV: RRR, no murmurs. Resp: CTAB, normal work of breathing. Abd: Soft, non-tender, non-distended. Ext: No edema. Skin: Warm, dry, decreased turgor. Neuro: Alert and oriented 3.physical muscle strength: distal lower ext and upper ext: 5/5  Patient Lines/Drains/Airways Status     Active Line/Drains/Airways     Name Placement date Placement time Site Days   Peripheral IV 06/22/24 20 G 1  Anterior;Proximal;Right Forearm 06/22/24  1123  Forearm  less than 1            Pertinent labs and imaging:      Latest Ref Rng & Units 06/20/2024    4:45 AM 06/18/2024    6:02 AM 06/17/2024    5:29 AM  CBC  WBC 4.0 - 10.5 K/uL 7.2  6.5  6.7   Hemoglobin 12.0 - 15.0 g/dL 89.6  89.7  88.4   Hematocrit 36.0 - 46.0 % 30.6  30.9  34.2   Platelets 150 - 400 K/uL 248  270  313        Latest Ref Rng & Units 06/22/2024    4:41 AM 06/21/2024    4:44 AM 06/20/2024    8:21 AM  CMP  Glucose 70 - 99 mg/dL 897  87  898   BUN 8 - 23 mg/dL 12  11  11    Creatinine 0.44 - 1.00 mg/dL 8.62  8.86  8.98   Sodium 135 - 145 mmol/L 142  139  141   Potassium 3.5 - 5.1 mmol/L 4.8  3.8  4.4   Chloride 98 - 111 mmol/L 107  106  103   CO2 22 - 32 mmol/L 30  21  30    Calcium  8.9 - 10.3 mg/dL 9.0  8.5  8.8     No results found.  ASSESSMENT/PLAN:  Assessment: Principal Problem:   Hypokalemia Active  Problems:   Severe recurrent major depression (HCC)   Unintentional weight loss   Lesion of tail pancreas   Protein-calorie malnutrition, severe   Hypophosphatemia   Acute renal injury due to hypovolemia   Frailty syndrome in geriatric patient   Mass of pancreas   Plan: Shanquita A. Knoch is a 78 year old woman with HTN previously on HCTZ, CKD III, severe malnutrition, and >30 lb weight loss who presented with profound hypokalemia and was found to have a small pancreatic tail lesion suspicious for a NET, now improving after electrolyte repletion with reassuring EGD/colonoscopy and plans for SNF placement.  # Elevated Creatinine / AKI Creatinine increased to 1.37 from a baseline of 0.9. She has no signs of volume loss on exam. On review of her oral intake, she reports poor hydration, particularly low water intake. Plan: - Administer 500 mL LR bolus - Encourage adequate oral hydration   #Hypokalemia- resolved (K 4.8) Patient presented with severe hypokalemia likely due to chronic poor intake,  thiazide diuretic use, and mild hypovolemia. No ongoing losses reported. Electrolytes are trending in the right direction with aggressive repletion, and QTc is improving as K rises. 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 No pancreatic duct dilation or metastatic signs.  She reports a 30-lb unintentional weight loss and decreased appetite but no abdominal pain or GI bleeding.  EGD/colonoscopy-performed 2 days ago which was reassuring, chromogranin was elevated 127 although it is not diagnostic for NET and could be higher due to PPI use as well.  Plan: - OP GI consult in 6 month  #Severe protein-calorie malnutrition with significant weight loss She has experienced >30 lb weight loss over one month, profound anorexia, reduced oral intake, and muscle wasting, meeting criteria for severe malnutrition. This is contributing to electrolyte instability, fatigue, and functional decline. Likely multifactorial, with suspected pancreatic NET playing a role. Plan: - Dietitian following; high-calorie supplementation - Encourage PO intake; calorie counts - Daily weights - Multivitamin + thiamine  - Monitor electrolytes closely (refeeding risk)  #Frailty & deconditioning with DOE DOE likely from deconditioning; echo normal. Initial PT/OT recommended SNF after reevaluation when K is normalized. Plan: - PT/OT for graded mobility   #Asymptomatic UTI Urinalysis today was positive for leukocyte and nitrate, however patient denied any symptoms like dysuria,  frequency, suprapubic pain. Plan: -Follow-up patient for urinary symptoms -No need to start antibiotic for now  #Prolonged QTc Resolved. Secondary to hypokalemia and several medications. No arrhythmias noted on telemetry. QT should normalize with correction of electrolytes and holding serotonergic/QT-active agents. Plan: - Maintain K > 4 and Mg > 2  #Hypertension Currently normotensive and occasionally soft, likely due to poor intake and dehydration. No need for antihypertensives during admission. Plan: -Discontinue HCTZ permanently -Hold amlodipine  - Reassess BP outpatient after recovery  #Hyperlipidemia Stable chronic condition. Plan: - Continue rosuvastatin    #MDD Patient has had history of MDD following her husband and son's passing for which, sertraline  150 mg and mirtazapine  were prescribed for her. Plan: -Resume sertraline  150 mg a.m. daily and mirtazapine  50 mg daily -Check EKG and QT to resume mirtazapine   Best Practice: Diet: Regular diet IVF: NOne VTE: Lovenox  Code: Full   Disposition planning: Therapy Recs: SNF, DME: walker Family Contact: , called and notified. DISPO: Anticipated discharge in TBD days to Skilled nursing facility   Best Practice: Diet: Regular diet IVF: None VTE: Place and maintain sequential compression device Start: 06/17/24 1706 enoxaparin  (LOVENOX ) injection 40 mg Start: 06/15/24 1445 Code: Full   Signature:  Tye Vigo [facility]  5:59 PM, 06/22/2024  On Call pager [phone]

## 2024-06-22 NOTE — Plan of Care (Signed)
°  Problem: Education: Goal: Knowledge of General Education information will improve Description: Including pain rating scale, medication(s)/side effects and non-pharmacologic comfort measures Outcome: Progressing   Problem: Health Behavior/Discharge Planning: Goal: Ability to manage health-related needs will improve Outcome: Progressing   Problem: Health Behavior/Discharge Planning: Goal: Ability to manage health-related needs will improve Outcome: Progressing   Problem: Clinical Measurements: Goal: Ability to maintain clinical measurements within normal limits will improve Outcome: Progressing Goal: Will remain free from infection Outcome: Progressing Goal: Diagnostic test results will improve Outcome: Progressing Goal: Respiratory complications will improve Outcome: Progressing Goal: Cardiovascular complication will be avoided Outcome: Progressing   Problem: Activity: Goal: Risk for activity intolerance will decrease Outcome: Progressing   Problem: Nutrition: Goal: Adequate nutrition will be maintained Outcome: Progressing   Problem: Elimination: Goal: Will not experience complications related to bowel motility Outcome: Progressing Goal: Will not experience complications related to urinary retention Outcome: Progressing   Problem: Pain Managment: Goal: General experience of comfort will improve and/or be controlled Outcome: Progressing   Problem: Safety: Goal: Ability to remain free from injury will improve Outcome: Progressing   Problem: Skin Integrity: Goal: Risk for impaired skin integrity will decrease Outcome: Progressing

## 2024-06-22 NOTE — Plan of Care (Signed)
 Patient calm and cooperative A&O X3-4. Patient able to ambulate to bathroom with front wheel walker, stand by assist. Hospitalist notified about patient complaint of nausea, new order placed. Patient left with call bell in reach and bed in lowest position.   Problem: Education: Goal: Knowledge of General Education information will improve Description: Including pain rating scale, medication(s)/side effects and non-pharmacologic comfort measures Outcome: Progressing   Problem: Clinical Measurements: Goal: Ability to maintain clinical measurements within normal limits will improve Outcome: Progressing   Problem: Activity: Goal: Risk for activity intolerance will decrease Outcome: Progressing   Problem: Pain Managment: Goal: General experience of comfort will improve and/or be controlled Outcome: Progressing   Problem: Safety: Goal: Ability to remain free from injury will improve Outcome: Progressing   Problem: Nutrition Goal: Nutritional status is improving Description: Monitor and assess patient for malnutrition (ex- brittle hair, bruises, dry skin, pale skin and conjunctiva, muscle wasting, smooth red tongue, and disorientation). Collaborate with interdisciplinary team and initiate plan and interventions as ordered.  Monitor patient's weight and dietary intake as ordered or per policy. Utilize nutrition screening tool and intervene per policy. Determine patient's food preferences and provide high-protein, high-caloric foods as appropriate.  Outcome: Progressing

## 2024-06-23 LAB — RENAL FUNCTION PANEL
Albumin: 2.5 g/dL — ABNORMAL LOW (ref 3.5–5.0)
Anion gap: 6 (ref 5–15)
BUN: 13 mg/dL (ref 8–23)
CO2: 29 mmol/L (ref 22–32)
Calcium: 8.4 mg/dL — ABNORMAL LOW (ref 8.9–10.3)
Chloride: 105 mmol/L (ref 98–111)
Creatinine, Ser: 0.84 mg/dL (ref 0.44–1.00)
GFR, Estimated: >60 mL/min (ref >60)
Glucose, Bld: 88 mg/dL (ref 70–99)
Phosphorus: 2.4 mg/dL — ABNORMAL LOW (ref 2.5–4.6)
Potassium: 3.9 mmol/L (ref 3.5–5.1)
Sodium: 140 mmol/L (ref 135–145)

## 2024-06-23 NOTE — Progress Notes (Signed)
 Physical Therapy Treatment Patient Details Name: Sarah [name] MRN: [id number] DOB: Jan 29, 1946 Today's Date: 06/23/2024   History of Present Illness Pt is a 78 y/o female presenting from PCP office recommendation due to hypokalemia. Pt also with poor PO intake and progressive weakness. PMH: HTN, HLD, and anxiety.    PT Comments  Pt received in supine and agreeable to session. Pt requests to use the bathroom and is able to complete pericare in sitting without assist. Pt able to increase gait distance without rest break this session, but continues to be limited by decreased activity tolerance. Pt able to lighten UE support on RW with cues, but continues to be reliant on it for balance and energy conservation. Pt continues to benefit from PT services to progress toward functional mobility goals.    If plan is discharge home, recommend the following: A lot of help with walking and/or transfers;A lot of help with bathing/dressing/bathroom;Assistance with cooking/housework;Direct supervision/assist for medications management;Direct supervision/assist for financial management;Assist for transportation;Supervision due to cognitive status;Help with stairs or ramp for entrance   Can travel by private vehicle     Yes  Equipment Recommendations  Rolling walker (2 wheels);BSC/3in1    Recommendations for Other Services       Precautions / Restrictions Precautions Precautions: Fall Recall of Precautions/Restrictions: Impaired Restrictions Weight Bearing Restrictions Per Provider Order: No     Mobility  Bed Mobility Overal bed mobility: Needs Assistance Bed Mobility: Supine to Sit     Supine to sit: Contact guard, HOB elevated, Used rails     General bed mobility comments: increased time and use of bed features    Transfers Overall transfer level: Needs assistance Equipment used: Rolling walker (2 wheels) Transfers: Sit to/from Stand Sit to Stand: Contact guard assist            General transfer comment: STS from EOB and low toilet with cues for hand placement and CGA for safety    Ambulation/Gait Ambulation/Gait assistance: Contact guard assist Gait Distance (Feet): 10 Feet (+65) Assistive device: Rolling walker (2 wheels) Gait Pattern/deviations: Step-through pattern, Decreased stride length, Trunk flexed Gait velocity: variable     General Gait Details: Pt demonstrates short steps with flexed posture, but is able to improve with cues. Cues for RW proximity and relaxing shoulders for reduced tension   Stairs             Wheelchair Mobility     Tilt Bed    Modified Rankin (Stroke Patients Only)       Balance Overall balance assessment: Needs assistance Sitting-balance support: No upper extremity supported, Feet supported Sitting balance-Leahy Scale: Good     Standing balance support: Bilateral upper extremity supported, During functional activity, Reliant on assistive device for balance Standing balance-Leahy Scale: Poor Standing balance comment: reliant on RW for energy conservation, but able to reduce support                            Communication Communication Communication: No apparent difficulties  Cognition Arousal: Alert Behavior During Therapy: WFL for tasks assessed/performed   PT - Cognitive impairments: No apparent impairments                         Following commands: Intact      Cueing Cueing Techniques: Verbal cues  Exercises      General Comments        Pertinent Vitals/Pain Pain Assessment Pain  Assessment: No/denies pain     PT Goals (current goals can now be found in the care plan section) Acute Rehab PT Goals Patient Stated Goal: Go to rehab to get stronger. PT Goal Formulation: With patient/family Time For Goal Achievement: 06/30/24 Progress towards PT goals: Progressing toward goals    Frequency    Min 2X/week       AM-PAC PT 6 Clicks Mobility   Outcome  Measure  Help needed turning from your back to your side while in a flat bed without using bedrails?: A Little Help needed moving from lying on your back to sitting on the side of a flat bed without using bedrails?: A Little Help needed moving to and from a bed to a chair (including a wheelchair)?: A Little Help needed standing up from a chair using your arms (e.g., wheelchair or bedside chair)?: A Little Help needed to walk in hospital room?: A Little Help needed climbing 3-5 steps with a railing? : A Lot 6 Click Score: 17    End of Session Equipment Utilized During Treatment: Gait belt Activity Tolerance: Patient limited by fatigue;Patient tolerated treatment well Patient left: in chair;with chair alarm set;with call bell/phone within reach Nurse Communication: Mobility status PT Visit Diagnosis: Unsteadiness on feet (R26.81);Other abnormalities of gait and mobility (R26.89);Muscle weakness (generalized) (M62.81);History of falling (Z91.81);Difficulty in walking, not elsewhere classified (R26.2);Adult, failure to thrive (R62.7)     Time: 8858-8840 PT Time Calculation (min) (ACUTE ONLY): 18 min  Charges:    $Gait Training: 8-22 mins PT General Charges $$ ACUTE PT VISIT: 1 Visit                    Darryle George, PTA Acute Rehabilitation Services Secure Chat Preferred  [phone]    Darryle George 06/23/2024, 1:24 PM

## 2024-06-23 NOTE — Progress Notes (Addendum)
 HD#0 SUBJECTIVE:  Patient Summary: Sarah [name] is a 78 year old woman with HTN previously on HCTZ, CKD III, severe malnutrition, and >30 lb weight loss who presented with profound hypokalemia and was found to have a small pancreatic tail lesion suspicious for a NET, now improving after electrolyte repletion with reassuring EGD/colonoscopy and plans for SNF placement.     Overnight Events: NAEON  Interim History: She was resting in bed in no acute distress, eating and speaking with family over the phone. She denied constipation, dysuria, chest pain, headache, or any new concerns.  OBJECTIVE:  Vital Signs: Vitals:   06/22/24 1931 06/23/24 0056 06/23/24 0351 06/23/24 0755  BP: (!) 121/53 (!) 111/52 (!) 85/62 125/81  Pulse: 81 73 77 80  Resp: 20 20 20 20   Temp: 98.1 F (36.7 C) 98.2 F (36.8 C) 98.2 F (36.8 C) 98.2 F (36.8 C)  TempSrc: Oral Oral Oral Oral  SpO2: 100% 98% 100% 99%  Weight:      Height:       Supplemental O2: Room Air SpO2: 99 % O2 Flow Rate (L/min): 2 L/min  Filed Weights   06/16/24 0333 06/17/24 1137  Weight: 78 kg 78 kg     Intake/Output Summary (Last 24 hours) at 06/23/2024 1147 Last data filed at 06/22/2024 1711 Gross per 24 hour  Intake 300 ml  Output --  Net 300 ml   Net IO Since Admission: 3,141.77 mL [06/23/24 1147]  Physical Exam: Physical Exam General: Pleasant female, no acute distress. HEENT: Moist mucous membranes. CV: RRR, no murmurs. Resp: CTAB, normal work of breathing. Abd: Soft, non-tender, non-distended. Ext: No edema. Skin: Warm Neuro: Alert and oriented 3.physical muscle strength: distal lower ext and upper ext: 5/5  Patient Lines/Drains/Airways Status     Active Line/Drains/Airways     Name Placement date Placement time Site Days   Peripheral IV 06/22/24 20 G 1 Anterior;Proximal;Right Forearm 06/22/24  1123  Forearm  1            Pertinent labs and imaging:      Latest Ref Rng & Units 06/20/2024     4:45 AM 06/18/2024    6:02 AM 06/17/2024    5:29 AM  CBC  WBC 4.0 - 10.5 K/uL 7.2  6.5  6.7   Hemoglobin 12.0 - 15.0 g/dL 89.6  89.7  88.4   Hematocrit 36.0 - 46.0 % 30.6  30.9  34.2   Platelets 150 - 400 K/uL 248  270  313        Latest Ref Rng & Units 06/23/2024    5:11 AM 06/22/2024    4:41 AM 06/21/2024    4:44 AM  CMP  Glucose 70 - 99 mg/dL 88  897  87   BUN 8 - 23 mg/dL 13  12  11    Creatinine 0.44 - 1.00 mg/dL 9.15  8.62  8.86   Sodium 135 - 145 mmol/L 140  142  139   Potassium 3.5 - 5.1 mmol/L 3.9  4.8  3.8   Chloride 98 - 111 mmol/L 105  107  106   CO2 22 - 32 mmol/L 29  30  21    Calcium  8.9 - 10.3 mg/dL 8.4  9.0  8.5     No results found.  ASSESSMENT/PLAN:  Assessment: Principal Problem:   Hypokalemia Active Problems:   Severe recurrent major depression (HCC)   Unintentional weight loss   Lesion of tail pancreas   Protein-calorie malnutrition, severe   Hypophosphatemia  Acute renal injury due to hypovolemia   Frailty syndrome in geriatric patient   Mass of pancreas   Plan: Sarah [name] is a 78 year old woman with HTN previously on HCTZ, CKD III, severe malnutrition, and >30 lb weight loss who presented with profound hypokalemia and was found to have a small pancreatic tail lesion suspicious for a NET, now improving after electrolyte repletion with reassuring EGD/colonoscopy and plans for SNF placement.   # Elevated Creatinine / AKI Resolved. Creatinine os 0,8 today. She has no signs of volume loss on exam. On review of her oral intake, she reports she tries to drink more water. Plan: - Encourage adequate oral hydration   #Hypokalemia- resolved (K 3.8) Patient presented with severe hypokalemia likely due to chronic poor intake, thiazide diuretic use, and mild hypovolemia. No ongoing losses reported. Electrolytes are trending in the right direction with aggressive repletion, and QTc is improving as K rises. 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 No pancreatic duct dilation or metastatic signs.  She reports a 30-lb unintentional weight loss and decreased appetite but no abdominal pain or GI bleeding.  EGD/colonoscopy-performed 2 days ago which was reassuring, chromogranin was elevated 127 although it is not diagnostic for NET and could be higher due to PPI use as well.  Plan: - OP GI consult in 6 month   #Severe protein-calorie malnutrition with significant weight loss She has experienced >30 lb weight loss over one month, profound anorexia, reduced oral intake, and muscle wasting, meeting criteria for severe malnutrition. This is contributing to electrolyte instability, fatigue, and functional decline. Likely multifactorial, with suspected pancreatic NET playing a role. Plan: - Dietitian following; high-calorie supplementation - Encourage PO intake; calorie counts - Daily weights - Multivitamin + thiamine  - Monitor electrolytes closely (refeeding risk)   #Frailty & deconditioning with DOE DOE likely from deconditioning; echo normal. Initial PT/OT recommended SNF after reevaluation when K is normalized. Plan: - PT/OT for graded mobility   #Asymptomatic bacteriuria Urinalysis today was positive for leukocyte and nitrate, however patient denied any symptoms like dysuria, frequency, suprapubic pain. Plan: -Follow-up patient for urinary symptoms -No need to start antibiotic for now   #Prolonged QTc Resolved. Secondary to hypokalemia and several medications. No arrhythmias noted on  telemetry. QT should normalize with correction of electrolytes and holding serotonergic/QT-active agents. Plan: - Maintain K > 4 and Mg > 2   #Hypertension Currently normotensive and occasionally soft, likely due to poor intake and dehydration. No need for antihypertensives during admission. Plan: -Discontinue HCTZ permanently -Hold amlodipine  - Reassess BP outpatient after recovery   #Hyperlipidemia Stable chronic condition. Plan: - Continue rosuvastatin    #MDD Patient has had history of MDD following her husband and son's passing for which, sertraline  150 mg and mirtazapine  were prescribed for her. Plan: -Resume sertraline  150 mg a.m. daily and mirtazapine  50 mg daily  Best Practice: Diet: Regular diet IVF: NOne VTE: Lovenox  Code: Full   Disposition planning: Therapy Recs: SNF, DME: walker Family Contact: , called and notified. DISPO: Anticipated discharge in TBD days to Skilled nursing facility   Best  Practice: Diet: Regular diet IVF: None VTE: Place and maintain sequential compression device Start: 06/17/24 1706 enoxaparin  (LOVENOX ) injection 40 mg Start: 06/15/24 1445 Code: Full Signature:  Nethaniel Mattie Bernadine [name]  11:47 AM, 06/23/2024  On Call pager [phone]

## 2024-06-23 NOTE — Plan of Care (Signed)
 Patient calm and cooperative A&O X4. Tele removed per orders, hospitalist aware. Patient left resting peacefully with respirations at 16.  Problem: Education: Goal: Knowledge of General Education information will improve Description: Including pain rating scale, medication(s)/side effects and non-pharmacologic comfort measures Outcome: Progressing   Problem: Health Behavior/Discharge Planning: Goal: Ability to manage health-related needs will improve Outcome: Progressing   Problem: Clinical Measurements: Goal: Ability to maintain clinical measurements within normal limits will improve Outcome: Progressing   Problem: Activity: Goal: Risk for activity intolerance will decrease Outcome: Progressing   Problem: Nutrition: Goal: Adequate nutrition will be maintained Outcome: Progressing   Problem: Coping: Goal: Level of anxiety will decrease Outcome: Progressing   Problem: Safety: Goal: Ability to remain free from injury will improve Outcome: Progressing

## 2024-06-24 NOTE — Progress Notes (Cosign Needed Addendum)
 HD#9 SUBJECTIVE:  Patient Summary: Yamilka Lopiccolo. Craker is a 78 year old woman with HTN previously on HCTZ, CKD III, severe malnutrition, and >30 lb weight loss who presented with profound hypokalemia and was found to have a small pancreatic tail lesion suspicious for a NET, now improving after electrolyte repletion with reassuring EGD/colonoscopy and plans for SNF placement.  Overnight Events: NAEON  Interim History: She was resting in bed in no acute distress, eating and speaking with family over the phone. She denied constipation, dysuria, chest pain, headache, or any new concerns.   OBJECTIVE:  Vital Signs: Vitals:   06/23/24 1900 06/24/24 0102 06/24/24 0513 06/24/24 0734  BP: (!) 141/61 103/60 108/60 (!) 115/54  Pulse: 77 77 67 77  Resp: 19 18 18 20   Temp: 98.4 F (36.9 C) 98 F (36.7 C) 97.9 F (36.6 C) 98.4 F (36.9 C)  TempSrc: Oral Oral Oral Oral  SpO2: 97% 100% 98% 100%  Weight:      Height:       Supplemental O2: Room Air SpO2: 100 % O2 Flow Rate (L/min): 2 L/min  Filed Weights   06/16/24 0333 06/17/24 1137  Weight: 78 kg 78 kg     Intake/Output Summary (Last 24 hours) at 06/24/2024 1351 Last data filed at 06/23/2024 1836 Gross per 24 hour  Intake 300 ml  Output --  Net 300 ml   Net IO Since Admission: 3,441.77 mL [06/24/24 1351]  Physical Exam: Physical Exam General: Pleasant female, no acute distress. HEENT: Moist mucous membranes. CV: RRR, no murmurs. Resp: CTAB, normal work of breathing. Abd: Soft, non-tender, non-distended. Ext: No edema. Skin: Warm Neuro: Alert and oriented 3.physical muscle strength: distal lower ext and upper ext: 5/5  Patient Lines/Drains/Airways Status     Active Line/Drains/Airways     Name Placement date Placement time Site Days   Peripheral IV 06/22/24 20 G 1 Anterior;Proximal;Right Forearm 06/22/24  1123  Forearm  2            Pertinent labs and imaging:      Latest Ref Rng & Units 06/20/2024    4:45 AM  06/18/2024    6:02 AM 06/17/2024    5:29 AM  CBC  WBC 4.0 - 10.5 K/uL 7.2  6.5  6.7   Hemoglobin 12.0 - 15.0 g/dL 89.6  89.7  88.4   Hematocrit 36.0 - 46.0 % 30.6  30.9  34.2   Platelets 150 - 400 K/uL 248  270  313        Latest Ref Rng & Units 06/23/2024    5:11 AM 06/22/2024    4:41 AM 06/21/2024    4:44 AM  CMP  Glucose 70 - 99 mg/dL 88  897  87   BUN 8 - 23 mg/dL 13  12  11    Creatinine 0.44 - 1.00 mg/dL 9.15  8.62  8.86   Sodium 135 - 145 mmol/L 140  142  139   Potassium 3.5 - 5.1 mmol/L 3.9  4.8  3.8   Chloride 98 - 111 mmol/L 105  107  106   CO2 22 - 32 mmol/L 29  30  21    Calcium  8.9 - 10.3 mg/dL 8.4  9.0  8.5     No results found.  ASSESSMENT/PLAN:  Assessment: Principal Problem:   Hypokalemia Active Problems:   Severe recurrent major depression (HCC)   Unintentional weight loss   Lesion of tail pancreas   Protein-calorie malnutrition, severe   Hypophosphatemia   Acute renal  injury due to hypovolemia   Frailty syndrome in geriatric patient   Mass of pancreas   Plan: Tylene A. Fickel is a 78 year old woman with HTN previously on HCTZ, CKD III, severe malnutrition, and >30 lb weight loss who presented with profound hypokalemia and was found to have a small pancreatic tail lesion suspicious for a NET, now improving after electrolyte repletion with reassuring EGD/colonoscopy and plans for SNF placement.   # Elevated Creatinine / AKI Resolved. Last Creatinine os 0.8 today. She has no signs of volume loss on exam. On review of her oral intake, she reports she tries to drink more water. Plan: - Encourage adequate oral hydration   #Hypokalemia- resolved (K 3.8) Patient presented with severe hypokalemia likely due to chronic poor intake, thiazide diuretic use, and mild hypovolemia. No ongoing losses reported. Electrolytes are trending in the right direction with aggressive repletion, and QTc is improving as K rises. 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 Beale, 78, was admitted with severe hypokalemia and weakness and was found to have a 1.1 cm pancreatic tail lesion on MRI/MRCP consistent with a suspected neuroendocrine tumor,MRCP confirms a lesion highly suspicious for a pancreatic NET, with imaging characteristics consistent with an indolent, non-functional tumor. No pancreatic duct dilation or metastatic signs.  She reports a 30-lb unintentional weight loss and decreased appetite but no abdominal pain or GI bleeding.  EGD/colonoscopy-performed 2 days ago which was reassuring, chromogranin was elevated 127 although it is not diagnostic for NET and could be higher due to PPI use as well.  Plan: - OP GI consult in 6 month   #Severe protein-calorie malnutrition with significant weight loss She has experienced >30 lb weight loss over one month, profound anorexia, reduced oral intake, and muscle wasting, meeting criteria for severe malnutrition. This is contributing to electrolyte instability, fatigue, and functional decline. Likely multifactorial, with suspected pancreatic NET playing a role. Plan: - Dietitian following; high-calorie supplementation - Encourage PO intake; calorie counts - Daily weights - Multivitamin + thiamine  - Monitor electrolytes closely (refeeding risk)   #Frailty & deconditioning with DOE DOE likely from deconditioning; echo normal. Initial PT/OT recommended SNF after reevaluation when K is normalized. Plan: - PT/OT for graded mobility   #Asymptomatic bacteriuria Urinalysis today was positive for leukocyte and nitrate, however patient denied any symptoms like dysuria, frequency, suprapubic pain. Plan: -Follow-up patient for urinary symptoms -No need to start antibiotic for now   #Prolonged QTc Resolved. Secondary to hypokalemia and several medications. No arrhythmias noted on  telemetry. QT should normalize with correction of electrolytes and holding serotonergic/QT-active agents. Plan: - Maintain K > 4 and Mg > 2   #Hypertension Currently normotensive and occasionally soft, likely due to poor intake and dehydration. No need for antihypertensives during admission. Plan: -Discontinue HCTZ permanently -Hold amlodipine  - Reassess BP outpatient after recovery   #Hyperlipidemia Stable chronic condition. Plan: - Continue rosuvastatin    #MDD Patient has had history of MDD following her husband and son's passing for which, sertraline  150 mg and mirtazapine  were prescribed for her. Plan: -Resume sertraline  150 mg a.m. daily and mirtazapine  50 mg daily  Best Practice: Diet: Regular diet IVF: NOne VTE: Lovenox  Code: Full   Disposition planning: Therapy Recs: SNF, DME: walker Family Contact: , called and notified. DISPO: Anticipated discharge in TBD days to Skilled nursing facility   Best Practice:  Diet: Regular diet IVF: None VTE: Place and maintain sequential compression device Start: 06/17/24 1706 enoxaparin  (LOVENOX ) injection 40 mg Start: 06/15/24 1445 Code: Full Signature:  Shaynah Hund Bernadine Jolynn Pack [facility]  1:51 PM, 06/24/2024  On Call pager [phone]

## 2024-06-24 NOTE — Plan of Care (Signed)
  Problem: Education: Goal: Knowledge of General Education information will improve Description: Including pain rating scale, medication(s)/side effects and non-pharmacologic comfort measures Outcome: Progressing   Problem: Health Behavior/Discharge Planning: Goal: Ability to manage health-related needs will improve Outcome: Progressing   Problem: Clinical Measurements: Goal: Ability to maintain clinical measurements within normal limits will improve Outcome: Progressing Goal: Will remain free from infection Outcome: Progressing Goal: Diagnostic test results will improve Outcome: Progressing Goal: Respiratory complications will improve Outcome: Progressing Goal: Cardiovascular complication will be avoided Outcome: Progressing   Problem: Activity: Goal: Risk for activity intolerance will decrease Outcome: Progressing   Problem: Nutrition: Goal: Adequate nutrition will be maintained Outcome: Progressing   Problem: Coping: Goal: Level of anxiety will decrease Outcome: Progressing   Problem: Elimination: Goal: Will not experience complications related to bowel motility Outcome: Progressing Goal: Will not experience complications related to urinary retention Outcome: Progressing   Problem: Pain Managment: Goal: General experience of comfort will improve and/or be controlled Outcome: Progressing   Problem: Safety: Goal: Ability to remain free from injury will improve Outcome: Progressing   Problem: Skin Integrity: Goal: Risk for impaired skin integrity will decrease Outcome: Progressing   Problem: Nutrition Goal: Nutritional status is improving Description: Monitor and assess patient for malnutrition (ex- brittle hair, bruises, dry skin, pale skin and conjunctiva, muscle wasting, smooth red tongue, and disorientation). Collaborate with interdisciplinary team and initiate plan and interventions as ordered.  Monitor patient's weight and dietary intake as ordered or per  policy. Utilize nutrition screening tool and intervene per policy. Determine patient's food preferences and provide high-protein, high-caloric foods as appropriate.  Outcome: Progressing

## 2024-06-24 NOTE — TOC Progression Note (Signed)
 Transition of Care [facility]) - Progression Note    Patient Details  Name: Sarah [name] MRN: [id number] Date of Birth: 1946/02/02  [facility] CM/SW Contact  Sherline Clack, [place] Phone Number: 06/24/2024, 12:29 PM  Clinical Narrative:     CSW attempted to call daughters Orlene Drilling, and Montel, but family members were unable to pick up. CSW left confidential VM with Montel Blamer and called [employer], patient's granddaughter. [employer] answered and CSW discussed SNF placement. Granddaughter agreeable to [facility]. CSW discussed [facility] with patient. CSW submitted insurance auth for [employer] through [employer], [place] ID: [id number]. CSW will continue to monitor auth and update DC plan.   Expected Discharge Plan: Skilled Nursing Facility Barriers to Discharge: English As A Second Language Teacher, Continued Medical Work up               Expected Discharge Plan and Services     Post Acute Care Choice: Skilled Nursing Facility Living arrangements for the past 2 months: Single Family Home                                       Social Drivers of Health (SDOH) Interventions SDOH Screenings   Food Insecurity: No Food Insecurity (06/15/2024)  Housing: Low Risk (06/15/2024)  Transportation Needs: No Transportation Needs (06/15/2024)  Utilities: Not At Risk (06/15/2024)  Alcohol Screen: Low Risk (03/23/2023)  Depression (PHQ2-9): Low Risk (06/14/2024)  Financial Resource Strain: Low Risk (10/07/2023)  Physical Activity: Inactive (10/07/2023)  Social Connections: Moderately Integrated (06/15/2024)  Stress: Stress Concern Present (10/07/2023)  Tobacco Use: Low Risk (06/17/2024)  Health Literacy: Adequate Health Literacy (10/07/2023)    Readmission Risk Interventions     No data to display

## 2024-06-24 NOTE — Progress Notes (Signed)
 Physical Therapy Treatment Patient Details Name: Sarah [name] MRN: [id number] DOB: Nov 22, 1945 Today's Date: 06/24/2024   History of Present Illness Pt is a 78 y/o female presenting from PCP office recommendation due to hypokalemia. Pt also with poor PO intake and progressive weakness. PMH: HTN, HLD, and anxiety.    PT Comments  Pt is progressing towards goals. Currently pt is CGA for safety with bed features for bed mobility, CGA to Min a for sit to stand and CGA for short distance gait with RW. Pt has a high risk for falls currently. Good family support at home. Due to pt current functional status, home set up and available assistance at home recommending skilled physical therapy services in order to address strength, balance and functional mobility to decrease risk for falls, injury, immobility, skin break down and re-hospitalization.       If plan is discharge home, recommend the following: A lot of help with walking and/or transfers;Assistance with cooking/housework;Assist for transportation;Supervision due to cognitive status;Help with stairs or ramp for entrance;A little help with walking and/or transfers   Can travel by private vehicle     Yes  Equipment Recommendations  Rolling walker (2 wheels);BSC/3in1       Precautions / Restrictions Precautions Precautions: Fall Recall of Precautions/Restrictions: Impaired Precaution/Restrictions Comments: hospitalized due to fall Restrictions Weight Bearing Restrictions Per Provider Order: No     Mobility  Bed Mobility Overal bed mobility: Needs Assistance Bed Mobility: Supine to Sit     Supine to sit: Contact guard, HOB elevated, Used rails     General bed mobility comments: increased time and use of bed features    Transfers Overall transfer level: Needs assistance Equipment used: Rolling walker (2 wheels) Transfers: Sit to/from Stand Sit to Stand: Min assist, Contact guard assist           General transfer comment:  Min A for steadying assist and cues for hand placement. Pt performed 4x 1x from EOB and 3x from recliner with rest breaks between.    Ambulation/Gait Ambulation/Gait assistance: Contact guard assist Gait Distance (Feet): 20 Feet Assistive device: Rolling walker (2 wheels) Gait Pattern/deviations: Step-through pattern, Decreased stride length, Trunk flexed Gait velocity: variable Gait velocity interpretation: <1.8 ft/sec, indicate of risk for recurrent falls   General Gait Details: Pt demonstrates short steps with flexed posture      Balance Overall balance assessment: Needs assistance Sitting-balance support: No upper extremity supported, Feet supported Sitting balance-Leahy Scale: Good Sitting balance - Comments: seated EOB, no LOB observed   Standing balance support: Bilateral upper extremity supported, During functional activity, Reliant on assistive device for balance Standing balance-Leahy Scale: Poor Standing balance comment: reliant on RW for energy conservation      Communication Communication Communication: No apparent difficulties  Cognition Arousal: Alert Behavior During Therapy: WFL for tasks assessed/performed   PT - Cognitive impairments: No apparent impairments     Following commands: Intact      Cueing Cueing Techniques: Verbal cues     General Comments General comments (skin integrity, edema, etc.): vital signs stable on room air      Pertinent Vitals/Pain Pain Assessment Pain Assessment: No/denies pain     PT Goals (current goals can now be found in the care plan section) Acute Rehab PT Goals Patient Stated Goal: Go to rehab to get stronger. PT Goal Formulation: With patient/family Time For Goal Achievement: 06/30/24 Potential to Achieve Goals: Fair Progress towards PT goals: Progressing toward goals    Frequency  Min 2X/week      PT Plan  Continue with current POC        AM-PAC PT 6 Clicks Mobility   Outcome Measure  Help  needed turning from your back to your side while in a flat bed without using bedrails?: A Little Help needed moving from lying on your back to sitting on the side of a flat bed without using bedrails?: A Little Help needed moving to and from a bed to a chair (including a wheelchair)?: A Little Help needed standing up from a chair using your arms (e.g., wheelchair or bedside chair)?: A Little Help needed to walk in hospital room?: A Little Help needed climbing 3-5 steps with a railing? : A Lot 6 Click Score: 17    End of Session Equipment Utilized During Treatment: Gait belt Activity Tolerance: Patient limited by fatigue;Patient tolerated treatment well Patient left: in chair;with chair alarm set;with call bell/phone within reach Nurse Communication: Mobility status PT Visit Diagnosis: Unsteadiness on feet (R26.81);Other abnormalities of gait and mobility (R26.89);Muscle weakness (generalized) (M62.81);History of falling (Z91.81);Difficulty in walking, not elsewhere classified (R26.2);Adult, failure to thrive (R62.7)     Time: 8667-8652 PT Time Calculation (min) (ACUTE ONLY): 15 min  Charges:    $Therapeutic Activity: 8-22 mins PT General Charges $$ ACUTE PT VISIT: 1 Visit                     Dorothyann Maier, DPT, CLT  Acute Rehabilitation Services Office: [phone] (Secure chat preferred)    Dorothyann VEAR Maier 06/24/2024, 4:13 PM

## 2024-06-24 NOTE — Progress Notes (Incomplete Revision)
 HD#9 SUBJECTIVE:  Patient Summary: Sarah [name]. Sarah [name] is a 78 year old woman with HTN previously on HCTZ, CKD III, severe malnutrition, and >30 lb weight loss who presented with profound hypokalemia and was found to have a small pancreatic tail lesion suspicious for a NET, now improving after electrolyte repletion with reassuring EGD/colonoscopy and plans for SNF placement.  Overnight Events: NAEON  Interim History: She was resting in bed in no acute distress, eating and speaking with family over the phone. She denied constipation, dysuria, chest pain, headache, or any new concerns.   OBJECTIVE:  Vital Signs: Vitals:   06/23/24 1900 06/24/24 0102 06/24/24 0513 06/24/24 0734  BP: (!) 141/61 103/60 108/60 (!) 115/54  Pulse: 77 77 67 77  Resp: 19 18 18 20   Temp: 98.4 F (36.9 C) 98 F (36.7 C) 97.9 F (36.6 C) 98.4 F (36.9 C)  TempSrc: Oral Oral Oral Oral  SpO2: 97% 100% 98% 100%  Weight:      Height:       Supplemental O2: Room Air SpO2: 100 % O2 Flow Rate (L/min): 2 L/min  Filed Weights   06/16/24 0333 06/17/24 1137  Weight: 78 kg 78 kg     Intake/Output Summary (Last 24 hours) at 06/24/2024 1351 Last data filed at 06/23/2024 1836 Gross per 24 hour  Intake 300 ml  Output --  Net 300 ml   Net IO Since Admission: 3,441.77 mL [06/24/24 1351]  Physical Exam: Physical Exam General: Pleasant female, no acute distress. HEENT: Moist mucous membranes. CV: RRR, no murmurs. Resp: CTAB, normal work of breathing. Abd: Soft, non-tender, non-distended. Ext: No edema. Skin: Warm Neuro: Alert and oriented 3.physical muscle strength: distal lower ext and upper ext: 5/5  Patient Lines/Drains/Airways Status     Active Line/Drains/Airways     Name Placement date Placement time Site Days   Peripheral IV 06/22/24 20 G 1 Anterior;Proximal;Right Forearm 06/22/24  1123  Forearm  2            Pertinent labs and imaging:      Latest Ref Rng & Units 06/20/2024    4:45 AM  06/18/2024    6:02 AM 06/17/2024    5:29 AM  CBC  WBC 4.0 - 10.5 K/uL 7.2  6.5  6.7   Hemoglobin 12.0 - 15.0 g/dL 89.6  89.7  88.4   Hematocrit 36.0 - 46.0 % 30.6  30.9  34.2   Platelets 150 - 400 K/uL 248  270  313        Latest Ref Rng & Units 06/23/2024    5:11 AM 06/22/2024    4:41 AM 06/21/2024    4:44 AM  CMP  Glucose 70 - 99 mg/dL 88  897  87   BUN 8 - 23 mg/dL 13  12  11    Creatinine 0.44 - 1.00 mg/dL 9.15  8.62  8.86   Sodium 135 - 145 mmol/L 140  142  139   Potassium 3.5 - 5.1 mmol/L 3.9  4.8  3.8   Chloride 98 - 111 mmol/L 105  107  106   CO2 22 - 32 mmol/L 29  30  21    Calcium  8.9 - 10.3 mg/dL 8.4  9.0  8.5     No results found.  ASSESSMENT/PLAN:  Assessment: Principal Problem:   Hypokalemia Active Problems:   Severe recurrent major depression (HCC)   Unintentional weight loss   Lesion of tail pancreas   Protein-calorie malnutrition, severe   Hypophosphatemia   Acute renal  injury due to hypovolemia   Frailty syndrome in geriatric patient   Mass of pancreas   Plan: Sarah [name] is a 78 year old woman with HTN previously on HCTZ, CKD III, severe malnutrition, and >30 lb weight loss who presented with profound hypokalemia and was found to have a small pancreatic tail lesion suspicious for a NET, now improving after electrolyte repletion with reassuring EGD/colonoscopy and plans for SNF placement.   # Elevated Creatinine / AKI Resolved. Last Creatinine os 0.8 today. She has no signs of volume loss on exam. On review of her oral intake, she reports she tries to drink more water. Plan: - Encourage adequate oral hydration   #Hypokalemia- resolved (K 3.8) Patient presented with severe hypokalemia likely due to chronic poor intake, thiazide diuretic use, and mild hypovolemia. No ongoing losses reported. Electrolytes are trending in the right direction with aggressive repletion, and QTc is improving as K rises. 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 No pancreatic duct dilation or metastatic signs.  She reports a 30-lb unintentional weight loss and decreased appetite but no abdominal pain or GI bleeding.  EGD/colonoscopy-performed 2 days ago which was reassuring, chromogranin was elevated 127 although it is not diagnostic for NET and could be higher due to PPI use as well.  Plan: - OP GI consult in 6 month   #Severe protein-calorie malnutrition with significant weight loss She has experienced >30 lb weight loss over one month, profound anorexia, reduced oral intake, and muscle wasting, meeting criteria for severe malnutrition. This is contributing to electrolyte instability, fatigue, and functional decline. Likely multifactorial, with suspected pancreatic NET playing a role. Plan: - Dietitian following; high-calorie supplementation - Encourage PO intake; calorie counts - Daily weights - Multivitamin + thiamine  - Monitor electrolytes closely (refeeding risk)   #Frailty & deconditioning with DOE DOE likely from deconditioning; echo normal. Initial PT/OT recommended SNF after reevaluation when K is normalized. Plan: - PT/OT for graded mobility   #Asymptomatic bacteriuria Urinalysis today was positive for leukocyte and nitrate, however patient denied any symptoms like dysuria, frequency, suprapubic pain. Plan: -Follow-up patient for urinary symptoms -No need to start antibiotic for now   #Prolonged QTc Resolved. Secondary to hypokalemia and several medications. No arrhythmias noted on  telemetry. QT should normalize with correction of electrolytes and holding serotonergic/QT-active agents. Plan: - Maintain K > 4 and Mg > 2   #Hypertension Currently normotensive and occasionally soft, likely due to poor intake and dehydration. No need for antihypertensives during admission. Plan: -Discontinue HCTZ permanently -Hold amlodipine  - Reassess BP outpatient after recovery   #Hyperlipidemia Stable chronic condition. Plan: - Continue rosuvastatin    #MDD Patient has had history of MDD following her husband and son's passing for which, sertraline  150 mg and mirtazapine  were prescribed for her. Plan: -Resume sertraline  150 mg a.m. daily and mirtazapine  50 mg daily  Best Practice: Diet: Regular diet IVF: NOne VTE: Lovenox  Code: Full   Disposition planning: Therapy Recs: SNF, DME: walker Family Contact: , called and notified. DISPO: Anticipated discharge in TBD days to Skilled nursing facility   Best Practice:  Diet: Regular diet IVF: None VTE: Place and maintain sequential compression device Start: 06/17/24 1706 enoxaparin  (LOVENOX ) injection 40 mg Start: 06/15/24 1445 Code: Full Signature:  Shalah Estelle [facility]  1:51 PM, 06/24/2024  On Call pager [phone]

## 2024-06-25 MED ORDER — ENSURE PLUS HIGH PROTEIN PO LIQD: 237.0000 mL | Freq: Two times a day (BID) | ORAL | Status: AC

## 2024-06-25 MED ORDER — PANTOPRAZOLE SODIUM 40 MG PO TBEC: 40.0000 mg | DELAYED_RELEASE_TABLET | Freq: Every day | ORAL | Status: DC

## 2024-06-25 MED ORDER — ADULT MULTIVITAMIN W/MINERALS CH: 1.0000 | ORAL_TABLET | Freq: Every day | ORAL | Status: AC

## 2024-06-25 NOTE — Discharge Instructions (Addendum)
 You were hospitalized for for generalized weakness, glad you are feeling better.  Thank you for allowing us  to be part of your care.   We arranged for you to follow up at:  [facility] follow-up with internal medicine clinic  Please note these changes made to your medications:  Stop taking HCTZ 12.5 mg.  Please START taking:  Pantoprazole  40 mg daily Daily multivitamin. Protein shakes/feeding supplement.  Continue to take these medicines: Mirtazapine  15 mg Sertraline  150 mg Crestor  40 mg  Please make sure to return to the hospital if you have new weakness, persistent nausea and vomiting.  Please call our clinic if you have any questions or concerns, we may be able to help and keep you from a long and expensive emergency room wait. Our clinic and after hours phone number is [phone], the best time to call is Monday through Friday 9 am to 4 pm but there is always someone available 24/7 if you have an emergency. If you need medication refills please notify your pharmacy one week in advance and they will send us  a request.

## 2024-06-25 NOTE — TOC Transition Note (Signed)
 Transition of Care [facility]) - Discharge Note   Patient Details  Name: Sarah [name] MRN: [id number] Date of Birth: 02-01-1946  Transition of Care [facility]) CM/SW Contact:  Sarah [name], [place] Phone Number: 06/25/2024, 11:14 AM   Clinical Narrative:  Pt for dc to [facility] today. [employer] auth details [id number], valid 12/12-12/17) provided to Sarah [name] in admissions who confirmed they are prepared to admit pt to room 605. RN provided with number for report and PT[facility] arranged for transport. Pt's dtr Sarah [name] aware of dc and reports agreeable. SW signing off at dc.   Julien Sarah, MSW, LCSW [phone] (coverage)      Final next level of care: Skilled Nursing Facility Barriers to Discharge: Barriers Resolved   Patient Goals and CMS Choice Patient states their goals for this hospitalization and ongoing recovery are:: to get better CMS Medicare.gov Compare Post Acute Care list provided to:: Patient Choice offered to / list presented to : Patient [place] ownership interest in [facility].provided to:: Patient    Discharge Placement              Patient chooses bed at: [facility] Patient to be transferred to facility by: [facility] Name of family member notified: Sarah [name]/dtr Patient and family notified of of transfer: 06/25/24  Discharge Plan and Services Additional resources added to the After Visit Summary for       Post Acute Care Choice: Skilled Nursing Facility                               Social Drivers of Health (SDOH) Interventions SDOH Screenings   Food Insecurity: No Food Insecurity (06/15/2024)  Housing: Low Risk (06/15/2024)  Transportation Needs: No Transportation Needs (06/15/2024)  Utilities: Not At Risk (06/15/2024)  Alcohol Screen: Low Risk (03/23/2023)  Depression (PHQ2-9): Low Risk (06/14/2024)  Financial Resource Strain: Low Risk (10/07/2023)  Physical Activity: Inactive (10/07/2023)  Social Connections: Moderately  Integrated (06/15/2024)  Stress: Stress Concern Present (10/07/2023)  Tobacco Use: Low Risk (06/17/2024)  Health Literacy: Adequate Health Literacy (10/07/2023)     Readmission Risk Interventions     No data to display

## 2024-06-25 NOTE — Progress Notes (Signed)
 Report called to [facility].

## 2024-06-25 NOTE — Discharge Summary (Signed)
 Name: Sarah [name] MRN: [id number] DOB: [birth year] 78 y.o. PCP: Karna Fellows, MD  Date of Admission: 06/15/2024 10:15 AM Date of Discharge: 06/25/2024 Attending Physician: Dr. Francesco Riis  Discharge Diagnosis: Principal Problem:   Hypokalemia Active Problems:   Severe recurrent major depression (HCC)   Unintentional weight loss   Lesion of tail pancreas   Protein-calorie malnutrition, severe   Hypophosphatemia   Acute renal injury due to hypovolemia   Frailty syndrome in geriatric patient   Mass of pancreas    Discharge Medications: Allergies as of 06/25/2024       Reactions   Strawberry Extract Rash   Pt states allergy to strawberries        Medication List     PAUSE taking these medications    amLODipine  2.5 MG tablet Wait to take this until your doctor or other care provider tells you to start again. Commonly known as: NORVASC  Take 1 tablet (2.5 mg total) by mouth daily.       STOP taking these medications    diclofenac  Sodium 1 % Gel Commonly known as: Voltaren  Arthritis Pain   hydrochlorothiazide  12.5 MG tablet Commonly known as: HYDRODIURIL        TAKE these medications    Blood Pressure Cuff Misc 1 Act by Does not apply route daily.   feeding supplement Liqd Take 237 mLs by mouth 2 (two) times daily between meals.   melatonin 3 MG Tabs tablet Take one tablet 1-2 hours before bedtime.   mirtazapine  15 MG tablet Commonly known as: Remeron  Take 1 tablet (15 mg total) by mouth at bedtime.   multivitamin with minerals Tabs tablet Take 1 tablet by mouth daily. Start taking on: June 26, 2024   pantoprazole  40 MG tablet Commonly known as: PROTONIX  Take 1 tablet (40 mg total) by mouth daily. Start taking on: June 26, 2024   rosuvastatin  40 MG tablet Commonly known as: Crestor  Take 1 tablet (40 mg total) by mouth daily.   sertraline  100 MG tablet Commonly known as: Zoloft  Take 1.5 tablets (150 mg total) by mouth daily.    Vitamin D  (Cholecalciferol ) 25 MCG (1000 UT) Caps Take 1 capsule by mouth daily.        Disposition and follow-up:   Sarah [name] was discharged from [facility] in Fair condition.  At the hospital follow up visit please address:  1.  Follow-up:  a.  Hypokalemia Stable on day of discharge.  K3.8.  Etiology of hypokalemia suspected to be from decreased p.o. intake, and HCTZ which was discontinued. -BMP as above   b.Pancreatic Tail Lesion (Suspected Non-Functional Neuroendocrine Tumor) CT abdomen prior to admission identified a pancreatic tail lesion, further characterized on MRCP as a 1.1 cm arterially enhancing lesion most consistent with a pancreatic neuroendocrine tumor. There was no associated pancreatic ductal dilation and no radiographic evidence of metastatic disease. Chromogranin A is mildly elevated but nonspecific. Gastroenterology was consulted and determined the lesion is too small for EUS-guided biopsy at this time.  EGD showed mild gastritis, colonoscopy unremarkable.- Will need to repeat imaging with MRCP in 3 to 6 months( March-June 2026). - Started on pantoprazole  40 mg for gastritis. - Will need follow-up with GI in 2 months( February 2026) - Repeat MRCP in 3 months (March to June 2026)  c.  Hypertension BP stable with antihypertensive.  -Monitor the BP and assess if patient home amlodipine  2.5 mg needs to be restarted.   2.  Labs / imaging needed at  time of follow-up: BMP  3.  Pending labs/ test needing follow-up: None  4.  Medication Changes Abx -   End Date:  Follow-up Appointments: - Needs to call to get GI follow up.  Hospital Course by problem list: Severe Hypokalemia The patient was admitted with profound hypokalemia (K 2.2 mmol/L) associated with generalized weakness and fatigue. This was felt to be multifactorial, primarily due to chronic poor oral intake, volume depletion, and continued use of hydrochlorothiazide . She required  aggressive IV and oral potassium repletion with close telemetry monitoring. QTc prolongation noted on admission improved with electrolyte correction. Hydrochlorothiazide  was permanently discontinued. Potassium levels gradually normalized and remained stable off supplementation prior to discharge.  Unintentional Weight Loss & Severe Protein-Calorie Malnutrition The patient reported >30 lb unintentional weight loss over several months with anorexia, altered taste, and functional decline. Nutrition evaluation confirmed severe protein-calorie malnutrition, contributing to frailty, electrolyte disturbances, and deconditioning. She was followed closely by the dietitian, started on high-calorie oral supplements, multivitamin, and thiamine , with monitoring for refeeding syndrome. Appetite and taste perception gradually improved during hospitalization.  Pancreatic Tail Lesion (Suspected Non-Functional Neuroendocrine Tumor) CT abdomen prior to admission identified a pancreatic tail lesion, further characterized on MRCP as a 1.1 cm arterially enhancing lesion most consistent with a pancreatic neuroendocrine tumor. There was no pancreatic duct dilation or evidence of metastatic disease. Chromogranin A was mildly elevated but nonspecific. GI was consulted and determined the lesion was too small for biopsy. EGD and colonoscopy performed during admission were reassuring (only benign polyps removed). Given the indolent features, outpatient surveillance with repeat MRCP in 3-6 months was recommended.  Acute Kidney Injury The patient developed mild pre-renal AKI (peak creatinine 1.37 mg/dL) in the setting of hypovolemia and poor oral intake. Renal function improved with IV fluids and improved hydration and returned to baseline prior to discharge.  Frailty, Deconditioning & Dyspnea on Exertion She demonstrated significant frailty and functional decline related to malnutrition and prolonged illness. Transthoracic  echocardiogram showed normal systolic and diastolic function without structural heart disease. Dyspnea was attributed to deconditioning. PT/OT evaluations recommended skilled nursing facility placement for rehabilitation and strengthening once electrolytes stabilized.  Prolonged QTc QTc prolongation on admission was attributed to hypokalemia and QT-prolonging medications. QT interval normalized with electrolyte correction. QT-prolonging agents were held initially and later cautiously resumed once QTc normalized.  Hypertension Blood pressures remained normotensive to low-normal throughout hospitalization, likely due to weight loss and volume depletion. Hydrochlorothiazide  was permanently discontinued and amlodipine  was held. Blood pressure management was deferred to outpatient follow-up.  Major Depressive Disorder The patient has a history of major depressive disorder following significant personal losses, which likely contributed to poor appetite and weight loss. Sertraline  was continued. Mirtazapine  was initially held due to QTc prolongation and later resumed once QT normalized to assist with mood and appetite.  Disposition With resolution of electrolyte abnormalities, improved oral intake, and stable renal function, the patient was medically optimized for discharge. Due to persistent frailty and deconditioning, she was discharged to a skilled nursing facility for rehabilitation, nutritional support, and continued recovery.   Discharge Subjective: Patient evaluated at bedside this AM.  Discharge Exam:   BP (!) 125/56 (BP Location: Left Arm)   Pulse 82   Temp 98.2 F (36.8 C) (Oral)   Resp 17   Ht 5' 4 (1.626 m)   Wt 78 kg   SpO2 100%   BMI 29.52 kg/m   Constitutional: Pleasant, NAD.   HENT: normocephalic atraumatic, mucous membranes moist Cardiovascular: regular rate  and rhythm, no m/r/g Pulmonary/Chest: normal work of breathing on room air, clear lungs bilaterally. CV: RRR, no  murmurs.  Pertinent Labs, Studies, and Procedures:     Latest Ref Rng & Units 06/20/2024    4:45 AM 06/18/2024    6:02 AM 06/17/2024    5:29 AM  CBC  WBC 4.0 - 10.5 K/uL 7.2  6.5  6.7   Hemoglobin 12.0 - 15.0 g/dL 89.6  89.7  88.4   Hematocrit 36.0 - 46.0 % 30.6  30.9  34.2   Platelets 150 - 400 K/uL 248  270  313        Latest Ref Rng & Units 06/23/2024    5:11 AM 06/22/2024    4:41 AM 06/21/2024    4:44 AM  CMP  Glucose 70 - 99 mg/dL 88  897  87   BUN 8 - 23 mg/dL 13  12  11    Creatinine 0.44 - 1.00 mg/dL 9.15  8.62  8.86   Sodium 135 - 145 mmol/L 140  142  139   Potassium 3.5 - 5.1 mmol/L 3.9  4.8  3.8   Chloride 98 - 111 mmol/L 105  107  106   CO2 22 - 32 mmol/L 29  30  21    Calcium  8.9 - 10.3 mg/dL 8.4  9.0  8.5     ECHOCARDIOGRAM COMPLETE Result Date: 06/16/2024    ECHOCARDIOGRAM REPORT   Patient Name:   Sarah [name] Ogborn Date of Exam: 06/16/2024 Medical Rec #:  [id number]      Height:       64.0 in Accession #:    [id number]     Weight:       172.0 lb Date of Birth:  1946/05/15      BSA:          1.835 m Patient Age:    78 years       BP:           108/53 mmHg Patient Gender: F              HR:           76 bpm. Exam Location:  Inpatient Procedure: 2D Echo, Cardiac Doppler and Color Doppler (Both Spectral and Color            Flow Doppler were utilized during procedure). Indications:    Dyspnea R06.00  History:        Patient has no prior history of Echocardiogram examinations.                 CKD; Risk Factors:Hypertension.  Sonographer:    Merlynn Argyle Referring Phys: [id number] MEGAN L [facility]  Sonographer Comments: Image acquisition challenging due to respiratory motion. IMPRESSIONS  1. Left ventricular ejection fraction, by estimation, is 60 to 65%. The left ventricle has normal function. The left ventricle has no regional wall motion abnormalities. Left ventricular diastolic parameters were normal.  2. Right ventricular systolic function is normal. The right ventricular size is  normal.  3. The mitral valve is normal in structure. No evidence of mitral valve regurgitation. No evidence of mitral stenosis.  4. The aortic valve was not well visualized. Aortic valve regurgitation is not visualized. Aortic valve sclerosis/calcification is present, without any evidence of aortic stenosis. Comparison(s): No prior Echocardiogram. FINDINGS  Left Ventricle: Left ventricular ejection fraction, by estimation, is 60 to 65%. The left ventricle has normal function. The left ventricle has no regional wall motion abnormalities. The left ventricular  internal cavity size was normal in size. There is  no left ventricular hypertrophy. Left ventricular diastolic parameters were normal. Right Ventricle: The right ventricular size is normal. No increase in right ventricular wall thickness. Right ventricular systolic function is normal. Left Atrium: Left atrial size was normal in size. Right Atrium: Right atrial size was normal in size. Pericardium: There is no evidence of pericardial effusion. Mitral Valve: The mitral valve is normal in structure. No evidence of mitral valve regurgitation. No evidence of mitral valve stenosis. Tricuspid Valve: The tricuspid valve is normal in structure. Tricuspid valve regurgitation is mild . No evidence of tricuspid stenosis. Aortic Valve: The aortic valve was not well visualized. Aortic valve regurgitation is not visualized. Aortic valve sclerosis/calcification is present, without any evidence of aortic stenosis. Pulmonic Valve: The pulmonic valve was not well visualized. Pulmonic valve regurgitation is not visualized. No evidence of pulmonic stenosis. Aorta: The aortic root is normal in size and structure. Venous: The inferior vena cava was not well visualized. IAS/Shunts: No atrial level shunt detected by color flow Doppler.  LEFT VENTRICLE PLAX 2D LVIDd:         4.10 cm   Diastology LVIDs:         2.90 cm   LV e' medial:    9.79 cm/s LV PW:         1.00 cm   LV E/e' medial:   7.4 LV IVS:        0.80 cm   LV e' lateral:   9.48 cm/s LVOT diam:     1.90 cm   LV E/e' lateral: 7.6 LV SV:         72 LV SV Index:   39 LVOT Area:     2.84 cm LV IVRT:       113 msec  RIGHT VENTRICLE             IVC RV Basal diam:  2.50 cm     IVC diam: 2.20 cm RV S prime:     11.30 cm/s TAPSE (M-mode): 2.4 cm LEFT ATRIUM             Index        RIGHT ATRIUM          Index LA diam:        3.10 cm 1.69 cm/m   RA Area:     9.25 cm LA Vol (A2C):   39.3 ml 21.42 ml/m  RA Volume:   17.90 ml 9.76 ml/m LA Vol (A4C):   29.8 ml 16.24 ml/m LA Biplane Vol: 34.3 ml 18.69 ml/m  AORTIC VALVE LVOT Vmax:   104.00 cm/s LVOT Vmean:  70.300 cm/s LVOT VTI:    0.254 m  AORTA Ao Root diam: 3.10 cm Ao Asc diam:  3.00 cm MITRAL VALVE                TRICUSPID VALVE MV Area (PHT): 2.07 cm     TR Peak grad:   24.4 mmHg MV Decel Time: 366 msec     TR Vmax:        247.00 cm/s MV E velocity: 72.00 cm/s MV A velocity: 116.00 cm/s  SHUNTS MV E/A ratio:  0.62         Systemic VTI:  0.25 m                             Systemic Diam: 1.90 cm [employer]  Electronically signed by Joelle Ren Ny Signature Date/Time: 06/16/2024/9:22:24 AM    Final    MR ABDOMEN MRCP W WO CONTAST Result Date: 06/16/2024 EXAM: MRCP WITH AND WITHOUT IV CONTRAST 06/16/2024 07:38:13 AM TECHNIQUE: Multisequence, multiplanar magnetic resonance images of the abdomen with and without intravenous contrast. MRCP sequences were performed. 7.5 mL gadobutrol  (GADAVIST ) 1 MMOL/ML injection was administered intravenously. COMPARISON: 06/03/2024 CLINICAL HISTORY: Pancreatic tail mass, epigastric pain. FINDINGS: LIVER: Unremarkable. GALLBLADDER AND BILIARY SYSTEM: At least 4 gallstones are present in the gallbladder. No intrahepatic or extrahepatic ductal dilation. SPLEEN: Unremarkable. PANCREAS/PANCREATIC DUCT: 1.1 cm in diameter T2 hyperintense lesion of the tip of the pancreatic tail on image 18 series 5 corresponding to the CT finding, demonstrating initial  T1 signal hypointensity on image 45 of series 13 followed by late arterial phase hyperenhancement on image 4217, and generally mildly hyperenhancing to isoenhancing on later phase images. Given the arterial phase enhancement characteristics, neuroendocrine tumor is favored. No substantial restriction of diffusion. No dorsal pancreatic duct dilatation. ADRENAL GLANDS: Unremarkable. KIDNEYS: Cyst in the right kidney lower pole appears benign and warrants no further imaging workup. Minimal scarring in the right kidney. LYMPH NODES: 0.9 cm porta hepatis lymph node on image 46 series 21, upper normal size. VASCULATURE: Unremarkable. PERITONEUM: No ascites. ABDOMINAL WALL: No hernia. No mass. BOWEL: Grossly unremarkable. No bowel obstruction. BONES: Mid to lower thoracic spondylosis. Lumbar spondylosis. SOFT TISSUES: Unremarkable. MISCELLANEOUS: Unremarkable. IMPRESSION: 1. 1.1 cm T2 hyperintense, arterially hyperenhancing lesion in the pancreatic tail tip, favoring pancreatic neuroendocrine tumor; no dorsal pancreatic duct dilatation or diffusion restriction. 2. Cholelithiasis. 3. Incidental findings including a benign-appearing right renal cyst, minimal right renal scarring, and degenerative changes of the mid to lower thoracic spine and lumbar spine. Electronically signed by: Ryan Salvage MD 06/16/2024 08:48 AM EST RP Workstation: [id number]   MR 3D Recon At Scanner Result Date: 06/16/2024 EXAM: MRCP WITH AND WITHOUT IV CONTRAST 06/16/2024 07:38:13 AM TECHNIQUE: Multisequence, multiplanar magnetic resonance images of the abdomen with and without intravenous contrast. MRCP sequences were performed. 7.5 mL gadobutrol  (GADAVIST ) 1 MMOL/ML injection was administered intravenously. COMPARISON: 06/03/2024 CLINICAL HISTORY: Pancreatic tail mass, epigastric pain. FINDINGS: LIVER: Unremarkable. GALLBLADDER AND BILIARY SYSTEM: At least 4 gallstones are present in the gallbladder. No intrahepatic or extrahepatic ductal  dilation. SPLEEN: Unremarkable. PANCREAS/PANCREATIC DUCT: 1.1 cm in diameter T2 hyperintense lesion of the tip of the pancreatic tail on image 18 series 5 corresponding to the CT finding, demonstrating initial T1 signal hypointensity on image 45 of series 13 followed by late arterial phase hyperenhancement on image 4217, and generally mildly hyperenhancing to isoenhancing on later phase images. Given the arterial phase enhancement characteristics, neuroendocrine tumor is favored. No substantial restriction of diffusion. No dorsal pancreatic duct dilatation. ADRENAL GLANDS: Unremarkable. KIDNEYS: Cyst in the right kidney lower pole appears benign and warrants no further imaging workup. Minimal scarring in the right kidney. LYMPH NODES: 0.9 cm porta hepatis lymph node on image 46 series 21, upper normal size. VASCULATURE: Unremarkable. PERITONEUM: No ascites. ABDOMINAL WALL: No hernia. No mass. BOWEL: Grossly unremarkable. No bowel obstruction. BONES: Mid to lower thoracic spondylosis. Lumbar spondylosis. SOFT TISSUES: Unremarkable. MISCELLANEOUS: Unremarkable. IMPRESSION: 1. 1.1 cm T2 hyperintense, arterially hyperenhancing lesion in the pancreatic tail tip, favoring pancreatic neuroendocrine tumor; no dorsal pancreatic duct dilatation or diffusion restriction. 2. Cholelithiasis. 3. Incidental findings including a benign-appearing right renal cyst, minimal right renal scarring, and degenerative changes of the mid to lower thoracic spine and lumbar spine. Electronically signed by: Ryan Salvage  MD 06/16/2024 08:48 AM EST RP Workstation: [id number]   CT CHEST W CONTRAST Result Date: 06/15/2024 CLINICAL DATA:  Unexplained weight loss. EXAM: CT CHEST WITH CONTRAST TECHNIQUE: Multidetector CT imaging of the chest was performed during intravenous contrast administration. RADIATION DOSE REDUCTION: This exam was performed according to the departmental dose-optimization program which includes automated exposure  control, adjustment of the mA and/or kV according to patient size and/or use of iterative reconstruction technique. CONTRAST:  75mL OMNIPAQUE  IOHEXOL  350 MG/ML SOLN COMPARISON:  04/07/2020. FINDINGS: Cardiovascular: Atherosclerotic calcification of the aorta. Heart is enlarged. No pericardial effusion. Mediastinum/Nodes: No pathologically enlarged mediastinal, hilar or axillary lymph nodes. Esophagus is grossly unremarkable. Lungs/Pleura: Lungs are clear. No pleural fluid. Airway is unremarkable. Upper Abdomen: Slightly elevated right hemidiaphragm. Gallstone. Renal cortical thinning and scarring bilaterally, right greater than left. Peripherally hyperattenuating nodule in the tail of the pancreas measures 12 mm (11 x 13 mm, 3/129), stable in size from 04/07/2020. Visualized portions of the liver, gallbladder, adrenal glands, kidneys, spleen, pancreas, stomach and bowel are otherwise grossly unremarkable. No upper abdominal adenopathy. Musculoskeletal: Degenerative changes in the spine. IMPRESSION: 1. No acute findings. 2. Hyperdense pancreatic tail lesion, present dating back to 04/07/2020. MR abdomen without and with contrast could be performed in further evaluation or possibly DOTATATE PET. 3. Cholelithiasis. 4.  Aortic atherosclerosis (ICD10-I70.0). Electronically Signed   By: Newell Eke M.D.   On: 06/15/2024 16:13     Discharge Instructions:   Signed: Celestina Czar, MD 06/25/2024, 10:45 AM   Pager: [phone]

## 2024-07-05 ENCOUNTER — Encounter: Admitting: Gastroenterology

## 2024-07-08 ENCOUNTER — Other Ambulatory Visit: Payer: Self-pay | Admitting: Licensed Clinical Social Worker

## 2024-07-11 ENCOUNTER — Telehealth: Payer: Self-pay

## 2024-07-11 NOTE — Transitions of Care (Post Inpatient/ED Visit) (Signed)
" ° °  07/11/2024  Name: Sarah [name] MRN: [id number] DOB: 30-Nov-1945  Today's TOC FU Call Status: Today's TOC FU Call Status:: Successful TOC FU Call Completed TOC FU Call Complete Date: 07/11/24  Patient's Name and Date of Birth confirmed. Name, DOB  Transition Care Management Follow-up Telephone Call Date of Discharge: 07/10/24 Discharge Facility: Other [facility]) Name of Other ([facility]) Discharge Facility: [facility] Type of Discharge: Inpatient Admission Primary Inpatient Discharge Diagnosis:: weakness How have you been since you were released from the hospital?: Better Any questions or concerns?: No  Items Reviewed: Did you receive and understand the discharge instructions provided?: Yes Medications obtained,verified, and reconciled?: Yes (Medications Reviewed) Any new allergies since your discharge?: No Dietary orders reviewed?: Yes Do you have support at home?: No  Medications Reviewed Today: Medications Reviewed Today     Reviewed by Emmitt Pan, LPN (Licensed Practical Nurse) on 07/11/24 at 0913  Med List Status: <None>   Medication Order Taking? Sig Documenting Provider Last Dose Status Informant  amLODipine  (NORVASC ) 2.5 MG tablet [id number]  Take 1 tablet (2.5 mg total) by mouth daily.  Patient not taking: Reported on 07/11/2024   Karna Fellows, MD  Active Self, Pharmacy Records  Blood Pressure Monitoring (BLOOD PRESSURE CUFF) MISC [id number] Yes 1 Act by Does not apply route daily. Charmayne Holmes, DO  Active Self, Pharmacy Records  feeding supplement (ENSURE PLUS HIGH PROTEIN) LIQD [id number] Yes Take 237 mLs by mouth 2 (two) times daily between meals. Koomson, Julius, MD  Active   melatonin 3 MG TABS tablet [id number] Yes Take one tablet 1-2 hours before bedtime. Karna Fellows, MD  Active Self, Pharmacy Records  mirtazapine  (REMERON ) 15 MG tablet [id number] Yes Take 1 tablet (15 mg total) by mouth at bedtime. Amilibia, Jaden, DO  Active Self, Pharmacy Records   Multiple Vitamin (MULTIVITAMIN WITH MINERALS) TABS tablet [id number] Yes Take 1 tablet by mouth daily. Koomson, Julius, MD  Active   pantoprazole  (PROTONIX ) 40 MG tablet [id number] Yes Take 1 tablet (40 mg total) by mouth daily. Koomson, Julius, MD  Active   rosuvastatin  (CRESTOR ) 40 MG tablet [id number] Yes Take 1 tablet (40 mg total) by mouth daily. Amilibia, Jaden, DO  Active Self, Pharmacy Records  sertraline  (ZOLOFT ) 100 MG tablet [id number] Yes Take 1.5 tablets (150 mg total) by mouth daily. Karna Fellows, MD  Active Self, Pharmacy Records  Vitamin D , Cholecalciferol , 25 MCG (1000 UT) CAPS [id number] Yes Take 1 capsule by mouth daily. Charmayne Holmes, DO  Active Self, Pharmacy Records            Home Care and Equipment/Supplies: Were Home Health Services Ordered?: Yes Name of Home Health Agency:: unknown Has Agency set up a time to come to your home?: No Any new equipment or medical supplies ordered?: NA  Functional Questionnaire: Do you need assistance with bathing/showering or dressing?: No Do you need assistance with meal preparation?: No Do you need assistance with eating?: No Do you have difficulty maintaining continence: No Do you need assistance with getting out of bed/getting out of a chair/moving?: No Do you have difficulty managing or taking your medications?: No  Follow up appointments reviewed: PCP Follow-up appointment confirmed?: Yes Date of PCP follow-up appointment?: 07/12/24 Follow-up Provider: [facility] Follow-up appointment confirmed?: NA Do you need transportation to your follow-up appointment?: No Do you understand care options if your condition(s) worsen?: Yes-patient verbalized understanding    SIGNATURE Pan Emmitt, LPN [facility] Nurse Health Advisor Direct Dial [phone]  "

## 2024-07-12 ENCOUNTER — Other Ambulatory Visit: Payer: Self-pay

## 2024-07-12 ENCOUNTER — Ambulatory Visit: Admitting: Student

## 2024-07-12 VITALS — BP 135/76 | HR 96 | Temp 99.3°F | Ht 64.0 in | Wt 189.0 lb

## 2024-07-12 DIAGNOSIS — F322 Major depressive disorder, single episode, severe without psychotic features: Secondary | ICD-10-CM

## 2024-07-12 DIAGNOSIS — E876 Hypokalemia: Secondary | ICD-10-CM | POA: Diagnosis not present

## 2024-07-12 DIAGNOSIS — I10 Essential (primary) hypertension: Secondary | ICD-10-CM | POA: Diagnosis not present

## 2024-07-12 DIAGNOSIS — K869 Disease of pancreas, unspecified: Secondary | ICD-10-CM | POA: Diagnosis not present

## 2024-07-12 DIAGNOSIS — E785 Hyperlipidemia, unspecified: Secondary | ICD-10-CM | POA: Diagnosis not present

## 2024-07-12 DIAGNOSIS — R630 Anorexia: Secondary | ICD-10-CM

## 2024-07-12 MED ORDER — ROSUVASTATIN CALCIUM 40 MG PO TABS
40.0000 mg | ORAL_TABLET | Freq: Every day | ORAL | 3 refills | Status: AC
  Filled 2024-07-12: qty 90, 90d supply, fill #0

## 2024-07-12 MED ORDER — PANTOPRAZOLE SODIUM 40 MG PO TBEC
40.0000 mg | DELAYED_RELEASE_TABLET | Freq: Every day | ORAL | 1 refills | Status: DC
  Filled 2024-07-12: qty 90, 90d supply, fill #0

## 2024-07-12 MED ORDER — SERTRALINE HCL 100 MG PO TABS
150.0000 mg | ORAL_TABLET | Freq: Every day | ORAL | 11 refills | Status: AC
  Filled 2024-07-12: qty 45, 30d supply, fill #0

## 2024-07-12 MED ORDER — MIRTAZAPINE 15 MG PO TABS
15.0000 mg | ORAL_TABLET | Freq: Every day | ORAL | 1 refills | Status: DC
  Filled 2024-07-12: qty 90, 90d supply, fill #0

## 2024-07-12 NOTE — Assessment & Plan Note (Signed)
 Orders:    rosuvastatin (CRESTOR) 40 MG tablet; Take 1 tablet (40 mg total) by mouth daily.
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
## 2024-07-12 NOTE — Assessment & Plan Note (Signed)
  Orders:   Basic metabolic panel with GFR

## 2024-07-12 NOTE — Patient Instructions (Addendum)
 Thank you, Ms.Naomie DELENA Potters for allowing us  to provide your care today. Today we discussed:  -Blood work to check your potassium, I will call with results -Do NOT take hydrochlorothiazide  or amlodipine  -Will check your blood pressure at next visit, if high then restart amlodipine   -Call the GI office today to reschedule your appointment [facility] [address] [place], [place] [address] [phone]  I have ordered the following medication/changed the following medications:  Refilled your medications:  Meds ordered this encounter  Medications   mirtazapine  (REMERON ) 15 MG tablet    Sig: Take 1 tablet (15 mg total) by mouth at bedtime.    Dispense:  90 tablet    Refill:  1   pantoprazole  (PROTONIX ) 40 MG tablet    Sig: Take 1 tablet (40 mg total) by mouth daily.    Dispense:  90 tablet    Refill:  1   rosuvastatin  (CRESTOR ) 40 MG tablet    Sig: Take 1 tablet (40 mg total) by mouth daily.    Dispense:  90 tablet    Refill:  3   sertraline  (ZOLOFT ) 100 MG tablet    Sig: Take 1.5 tablets (150 mg total) by mouth daily.    Dispense:  45 tablet    Refill:  11     Follow up: 1-2 months to recheck blood pressure   Should you have any questions or concerns please call the internal medicine clinic at [phone].    Brandon Wiechman, D.O. [facility]
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
## 2024-07-13 ENCOUNTER — Ambulatory Visit: Payer: Self-pay | Admitting: Student

## 2024-07-13 ENCOUNTER — Encounter: Payer: Self-pay | Admitting: Student

## 2024-07-13 LAB — BASIC METABOLIC PANEL WITH GFR
BUN/Creatinine Ratio: 20 (ref 12–28)
BUN: 20 mg/dL (ref 8–27)
CO2: 27 mmol/L (ref 20–29)
Calcium: 10.3 mg/dL (ref 8.7–10.3)
Chloride: 102 mmol/L (ref 96–106)
Creatinine, Ser: 1.01 mg/dL — ABNORMAL HIGH (ref 0.57–1.00)
Glucose: 96 mg/dL (ref 70–99)
Potassium: 4.8 mmol/L (ref 3.5–5.2)
Sodium: 142 mmol/L (ref 134–144)
eGFR: 57 mL/min/1.73 — ABNORMAL LOW

## 2024-07-13 NOTE — Assessment & Plan Note (Addendum)
 Outpatient CT abdomen showed concern of pancreatic tail lesion during admission MRCP was done and showed a 1.1 cm enhancing lesion concerning for pancreatic neuroendocrine tumor. Suspected as nonfunctional neuroendocrine tumor. Per GI lesion was too small for EUS guided biopsy.    Plan is for repeat MRCP in 3-6 months (earliest is March).  Has not followed up with GI outpatient.  Provided patient with contact information for GI office today.  Plan -Provided contact information for GI office on AVS -Plan for repeat MRCP in 3-6 months -Continues to take Protonix  40 mg daily
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
## 2024-07-13 NOTE — Assessment & Plan Note (Signed)
 BMP today 135/76.  HCTZ was discontinued.  Amlodipine  2.5 mg was held and not continued during hospitalization.  BP has been stable/normotensive.  Plan to reassess BP at next visit (patient does not have BP monitor at home).  Plan -Continue holding amlodipine  -Reassess BP at next OV, if elevated consider restart amlodipine 

## 2024-07-13 NOTE — Patient Instructions (Signed)
 Visit Information  Thank you for taking time to visit with me today. Please don't hesitate to contact me if I can be of assistance to you before our next scheduled appointment.  Your next care management appointment is by telephone on 1/12 at 2 PM  Please call the care guide team at [phone] if you need to cancel, schedule, or reschedule an appointment.   Please call the Suicide and Crisis Lifeline: 988 go to [facility] Urgent [facility] [address] [place] [phone]) call 911 if you are experiencing a Mental Health or Behavioral Health Crisis or need someone to talk to.  Rolin Kerns, LCSW [place]  [facility], [facility] Clinical Social Worker Direct Dial: [phone]  Fax: [phone] Website: [email or web address] 2:32 PM

## 2024-07-18 ENCOUNTER — Telehealth: Payer: Self-pay | Admitting: *Deleted

## 2024-07-18 NOTE — Progress Notes (Signed)
 Internal Medicine Clinic Attending  Case discussed with the resident at the time of the visit.  We reviewed the resident's history and exam and pertinent patient test results.  I agree with the assessment, diagnosis, and plan of care documented in the resident's note.

## 2024-07-18 NOTE — Telephone Encounter (Unsigned)
 Copied from CRM [id number]. Topic: General - Other >> Jul 18, 2024 12:21 PM Debby BROCKS wrote: Reason for CRM: [facility] called stating that a home health order was sent over but it was missing the physician signature. They are asking for the order to be resent with the missing detail. Fax: [phone] Callback: [phone]

## 2024-07-25 ENCOUNTER — Other Ambulatory Visit: Payer: Self-pay | Admitting: Licensed Clinical Social Worker

## 2024-07-27 NOTE — Patient Instructions (Signed)
 Visit Information  Thank you for taking time to visit with me today. Please don't hesitate to contact me if I can be of assistance to you before our next scheduled appointment.  Your next care management appointment is by telephone on 2/9 at 2 PM  Please call the care guide team at [phone] if you need to cancel, schedule, or reschedule an appointment.   Please call the Suicide and Crisis Lifeline: 988 go to [facility] Urgent [facility] [address] [place] [phone]) call 911 if you are experiencing a Mental Health or Behavioral Health Crisis or need someone to talk to.  Rolin Kerns, LCSW [place]  [facility], [facility] Clinical Social Worker Direct Dial: [phone]  Fax: [phone] Website: [email or web address] 10:44 AM
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
## 2024-08-12 ENCOUNTER — Other Ambulatory Visit: Payer: Self-pay

## 2024-08-12 ENCOUNTER — Ambulatory Visit: Payer: Self-pay

## 2024-08-12 VITALS — BP 125/71 | HR 79 | Temp 98.2°F | Ht 64.0 in | Wt 203.4 lb

## 2024-08-12 DIAGNOSIS — R630 Anorexia: Secondary | ICD-10-CM

## 2024-08-12 DIAGNOSIS — R5381 Other malaise: Secondary | ICD-10-CM | POA: Diagnosis not present

## 2024-08-12 DIAGNOSIS — N1831 Chronic kidney disease, stage 3a: Secondary | ICD-10-CM

## 2024-08-12 DIAGNOSIS — I129 Hypertensive chronic kidney disease with stage 1 through stage 4 chronic kidney disease, or unspecified chronic kidney disease: Secondary | ICD-10-CM

## 2024-08-12 DIAGNOSIS — I10 Essential (primary) hypertension: Secondary | ICD-10-CM

## 2024-08-12 DIAGNOSIS — E785 Hyperlipidemia, unspecified: Secondary | ICD-10-CM

## 2024-08-12 MED ORDER — PANTOPRAZOLE SODIUM 40 MG PO TBEC
40.0000 mg | DELAYED_RELEASE_TABLET | Freq: Every day | ORAL | 1 refills | Status: AC
  Filled 2024-08-12: qty 90, 90d supply, fill #0

## 2024-08-12 MED ORDER — MIRTAZAPINE 15 MG PO TABS
15.0000 mg | ORAL_TABLET | Freq: Every day | ORAL | 1 refills | Status: AC
  Filled 2024-08-12: qty 90, 90d supply, fill #0
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
## 2024-08-12 NOTE — Progress Notes (Signed)
 Internal Medicine Clinic Attending  Case discussed with the resident at the time of the visit.  We reviewed the resident's history and exam and pertinent patient test results.  I agree with the assessment, diagnosis, and plan of care documented in the resident's note.

## 2024-08-12 NOTE — Patient Instructions (Addendum)
 It was wonderful seeing you today!   I am so sorry you fell. Please be careful going outside, the ice increases risk of fall.   We are not making any changes to your medications today.   Please be on the lookout for someone to call about home health and the walker.   Please eat a healthy diet of whole foods including vegetables, fruit, lean protein including grilled chicken and fish.   Please try to stay moving and decrease time spent sitting down.   Please wear compression stockings and elevate your feet when sitting to limit leg swelling. Also avoid sugar and salty foods.   If you have any questions please feel free to the call the clinic at anytime at [phone].  Have a blessed day,  Dr. Charmayne

## 2024-08-18 ENCOUNTER — Other Ambulatory Visit: Payer: Self-pay

## 2024-08-22 ENCOUNTER — Telehealth: Admitting: Licensed Clinical Social Worker

## 2024-10-12 ENCOUNTER — Ambulatory Visit
